# Patient Record
Sex: Male | Born: 1935 | Race: White | Hispanic: No | Marital: Married | Smoking: Never smoker
Health system: Southern US, Community
[De-identification: ages and names within clinical notes are randomized; demographics above are authoritative.]

## PROBLEM LIST (undated history)

## (undated) DIAGNOSIS — R0689 Other abnormalities of breathing: Secondary | ICD-10-CM

## (undated) DIAGNOSIS — I10 Essential (primary) hypertension: Secondary | ICD-10-CM

## (undated) DIAGNOSIS — G629 Polyneuropathy, unspecified: Secondary | ICD-10-CM

## (undated) DIAGNOSIS — D649 Anemia, unspecified: Secondary | ICD-10-CM

## (undated) DIAGNOSIS — Z0000 Encounter for general adult medical examination without abnormal findings: Secondary | ICD-10-CM

## (undated) DIAGNOSIS — E039 Hypothyroidism, unspecified: Secondary | ICD-10-CM

## (undated) DIAGNOSIS — K7460 Unspecified cirrhosis of liver: Secondary | ICD-10-CM

## (undated) DIAGNOSIS — C159 Malignant neoplasm of esophagus, unspecified: Principal | ICD-10-CM

## (undated) DIAGNOSIS — M109 Gout, unspecified: Secondary | ICD-10-CM

## (undated) DIAGNOSIS — M810 Age-related osteoporosis without current pathological fracture: Secondary | ICD-10-CM

## (undated) DIAGNOSIS — J90 Pleural effusion, not elsewhere classified: Secondary | ICD-10-CM

## (undated) DIAGNOSIS — B019 Varicella without complication: Secondary | ICD-10-CM

## (undated) DIAGNOSIS — E7800 Pure hypercholesterolemia, unspecified: Secondary | ICD-10-CM

## (undated) DIAGNOSIS — I2510 Atherosclerotic heart disease of native coronary artery without angina pectoris: Secondary | ICD-10-CM

## (undated) DIAGNOSIS — B059 Measles without complication: Secondary | ICD-10-CM

## (undated) HISTORY — PX: TONSILLECTOMY: SUR1361

## (undated) HISTORY — DX: Pleural effusion, not elsewhere classified: J90

## (undated) HISTORY — DX: Anemia, unspecified: D64.9

## (undated) HISTORY — DX: Malignant neoplasm of esophagus, unspecified: C15.9

## (undated) HISTORY — PX: CATARACT EXTRACTION, BILATERAL: SHX1313

## (undated) HISTORY — DX: Unspecified cirrhosis of liver: K74.60

## (undated) HISTORY — DX: Measles without complication: B05.9

## (undated) HISTORY — DX: Age-related osteoporosis without current pathological fracture: M81.0

## (undated) HISTORY — PX: FOOT SURGERY: SHX648

## (undated) HISTORY — DX: Gout, unspecified: M10.9

## (undated) HISTORY — DX: Other abnormalities of breathing: R06.89

## (undated) HISTORY — DX: Encounter for general adult medical examination without abnormal findings: Z00.00

## (undated) HISTORY — PX: HERNIA REPAIR: SHX51

## (undated) HISTORY — PX: PELVIC ABCESS DRAINAGE: SHX2189

## (undated) HISTORY — DX: Varicella without complication: B01.9

---

## 2010-06-01 ENCOUNTER — Institutional Professional Consult (permissible substitution): Payer: Self-pay | Admitting: Cardiovascular Disease

## 2011-01-21 ENCOUNTER — Encounter: Payer: Self-pay | Admitting: *Deleted

## 2011-01-21 ENCOUNTER — Emergency Department (HOSPITAL_BASED_OUTPATIENT_CLINIC_OR_DEPARTMENT_OTHER)
Admission: EM | Admit: 2011-01-21 | Discharge: 2011-01-21 | Disposition: A | Discharge status: Home / Self Care | Payer: Medicare Other | Attending: Emergency Medicine | Admitting: Emergency Medicine

## 2011-01-21 ENCOUNTER — Emergency Department (INDEPENDENT_AMBULATORY_CARE_PROVIDER_SITE_OTHER): Payer: Medicare Other

## 2011-01-21 DIAGNOSIS — Z79899 Other long term (current) drug therapy: Secondary | ICD-10-CM | POA: Insufficient documentation

## 2011-01-21 DIAGNOSIS — S80211A Abrasion, right knee, initial encounter: Secondary | ICD-10-CM

## 2011-01-21 DIAGNOSIS — S0180XA Unspecified open wound of other part of head, initial encounter: Secondary | ICD-10-CM | POA: Insufficient documentation

## 2011-01-21 DIAGNOSIS — E7800 Pure hypercholesterolemia, unspecified: Secondary | ICD-10-CM | POA: Insufficient documentation

## 2011-01-21 DIAGNOSIS — G319 Degenerative disease of nervous system, unspecified: Secondary | ICD-10-CM | POA: Insufficient documentation

## 2011-01-21 DIAGNOSIS — W19XXXA Unspecified fall, initial encounter: Secondary | ICD-10-CM

## 2011-01-21 DIAGNOSIS — IMO0002 Reserved for concepts with insufficient information to code with codable children: Secondary | ICD-10-CM | POA: Insufficient documentation

## 2011-01-21 DIAGNOSIS — S0181XA Laceration without foreign body of other part of head, initial encounter: Secondary | ICD-10-CM

## 2011-01-21 DIAGNOSIS — I10 Essential (primary) hypertension: Secondary | ICD-10-CM | POA: Insufficient documentation

## 2011-01-21 DIAGNOSIS — Y9301 Activity, walking, marching and hiking: Secondary | ICD-10-CM | POA: Insufficient documentation

## 2011-01-21 DIAGNOSIS — S40019A Contusion of unspecified shoulder, initial encounter: Secondary | ICD-10-CM | POA: Insufficient documentation

## 2011-01-21 DIAGNOSIS — M25519 Pain in unspecified shoulder: Secondary | ICD-10-CM

## 2011-01-21 DIAGNOSIS — E079 Disorder of thyroid, unspecified: Secondary | ICD-10-CM | POA: Insufficient documentation

## 2011-01-21 HISTORY — DX: Essential (primary) hypertension: I10

## 2011-01-21 HISTORY — DX: Pure hypercholesterolemia, unspecified: E78.00

## 2011-01-21 MED ORDER — TETANUS-DIPHTHERIA TOXOIDS TD 5-2 LFU IM INJ
0.5000 mL | INJECTION | Freq: Once | INTRAMUSCULAR | Status: AC
  Administered 2011-01-21: 0.5 mL via INTRAMUSCULAR
  Filled 2011-01-21: qty 0.5

## 2011-01-21 MED ORDER — LIDOCAINE-EPINEPHRINE 2 %-1:100000 IJ SOLN
INTRAMUSCULAR | Status: AC
  Filled 2011-01-21: qty 1

## 2011-01-21 NOTE — ED Provider Notes (Signed)
History     CSN: [id number]  Arrival date & time 01/21/11  4098   First MD Initiated Contact with Patient 01/21/11 0930      Chief Complaint  Patient presents with  . Fall    (Consider location/radiation/quality/duration/timing/severity/associated sxs/prior treatment) Patient is a 77 y.o. male presenting with fall. The history is provided by the patient.  Fall The accident occurred less than 1 hour ago. The fall occurred while walking. He fell from a height of 1 to 2 ft. He landed on concrete. The volume of blood lost was moderate. The point of impact was the head. The pain is present in the head and right shoulder. The pain is moderate. He was ambulatory at the scene. Pertinent negatives include no visual change and no nausea. He has tried nothing for the symptoms.    Past Medical History  Diagnosis Date  . Hypertension   . High cholesterol   . Thyroid disease     Past Surgical History  Procedure Date  . Hernia repair   . Pelvic abcess drainage     states surgery affected control of BM  . Tonsillectomy     No family history on file.  History  Substance Use Topics  . Smoking status: Never Smoker   . Smokeless tobacco: Not on file  . Alcohol Use: Yes      Review of Systems  Gastrointestinal: Negative for nausea.  All other systems reviewed and are negative.    Allergies  Aspirin  Home Medications   Current Outpatient Rx  Name Route Sig Dispense Refill  . ATORVASTATIN CALCIUM 10 MG PO TABS Oral Take 10 mg by mouth at bedtime.     . CLOPIDOGREL BISULFATE 75 MG PO TABS Oral Take 75 mg by mouth daily.     Marland Kitchen LEVOTHYROXINE SODIUM 75 MCG PO TABS Oral Take 75 mcg by mouth daily.      Marland Kitchen LISINOPRIL 10 MG PO TABS Oral Take 10 mg by mouth daily.     Marland Kitchen METOPROLOL TARTRATE 25 MG PO TABS Oral Take 25 mg by mouth 2 (two) times daily.      Marland Kitchen NITROGLYCERIN 0.4 MG SL SUBL Sublingual Place 0.4 mg under the tongue every 5 (five) minutes as needed. For chest pain      BP  178/54  Pulse 61  Temp(Src) 97.9 F (36.6 C) (Oral)  Resp 20  SpO2 100%  Physical Exam  Nursing note and vitals reviewed. Constitutional: He is oriented to person, place, and time. He appears well-developed and well-nourished.  HENT:  Head: Normocephalic.       The right forehead has a laceration about 1.5 cm in length with a marked hematoma surrounding it.  A small pebble was removed.  Bleeding has been steady since injury.  Eyes: EOM are normal. Pupils are equal, round, and reactive to light.  Neck: Normal range of motion. Neck supple.  Cardiovascular: Normal rate, regular rhythm and normal heart sounds.   Pulmonary/Chest: Effort normal and breath sounds normal.  Abdominal: Soft. Bowel sounds are normal. He exhibits no distension. There is no tenderness.  Musculoskeletal: Normal range of motion.       There is an abrasion to the right knee, small laceration to the tip of the right thumb (no sutures indicated).    Neurological: He is alert and oriented to person, place, and time. No cranial nerve deficit.  Skin: Skin is warm and dry.    ED Course  Procedures (including critical care time)  Labs Reviewed - No data to display No results found.   No diagnosis found.  01/21/2011  LACERATION REPAIR Performed by: Geoffery Lyons Authorized by: Geoffery Lyons Consent: Verbal consent obtained. Risks and benefits: risks, benefits and alternatives were discussed Consent given by: patient Patient identity confirmed: provided demographic data Prepped and Draped in normal sterile fashion Wound explored  Laceration Location: forehead   Laceration Length: 1.5 cm  No Foreign Bodies seen or palpated  Anesthesia: local infiltration  Local anesthetic: lidocaine 2% with epinephrine  Anesthetic total: 2 ml  Irrigation method: syringe Amount of cleaning: standard  Skin closure: 6-0 nylon  Number of sutures: 3  Technique: simple interrupted  Patient tolerance: Patient  tolerated the procedure well with no immediate complications.  MDM  Patient had lac repaired then went to CT.  The scan showed a foreign body but no bleed or traumatic findings.  I removed the sutures, then was able to located the fb which was removed with forceps, the wound irrigated, then closed again with 3 6-0 ethilon sutures.  Will discharge to home with local wound care.          Geoffery Lyons, MD 01/21/11 [phone]

## 2011-01-21 NOTE — ED Notes (Signed)
Patient states he tripped and hit right forehead and fell on R knee, no loc, hit head head on pavement, states fell on R thumb, hit knee and and then hit head

## 2011-01-21 NOTE — ED Notes (Signed)
I completed all wound care including cleaning with "safety cleans" and then secured all with 2x2's, bacitracin, kerlix, and tape. Paper tape used on forehead injury. I gave patient's wife additional wound care supplies, and I advised direct pressure if bleeding resumes.

## 2011-01-28 ENCOUNTER — Encounter (HOSPITAL_BASED_OUTPATIENT_CLINIC_OR_DEPARTMENT_OTHER): Payer: Self-pay | Admitting: Emergency Medicine

## 2011-01-28 ENCOUNTER — Emergency Department (HOSPITAL_BASED_OUTPATIENT_CLINIC_OR_DEPARTMENT_OTHER)
Admission: EM | Admit: 2011-01-28 | Discharge: 2011-01-28 | Disposition: A | Discharge status: Home / Self Care | Payer: Medicare Other | Attending: Emergency Medicine | Admitting: Emergency Medicine

## 2011-01-28 DIAGNOSIS — E079 Disorder of thyroid, unspecified: Secondary | ICD-10-CM | POA: Diagnosis not present

## 2011-01-28 DIAGNOSIS — I10 Essential (primary) hypertension: Secondary | ICD-10-CM | POA: Diagnosis not present

## 2011-01-28 DIAGNOSIS — Z79899 Other long term (current) drug therapy: Secondary | ICD-10-CM | POA: Insufficient documentation

## 2011-01-28 DIAGNOSIS — I2510 Atherosclerotic heart disease of native coronary artery without angina pectoris: Secondary | ICD-10-CM | POA: Insufficient documentation

## 2011-01-28 DIAGNOSIS — E7800 Pure hypercholesterolemia, unspecified: Secondary | ICD-10-CM | POA: Insufficient documentation

## 2011-01-28 DIAGNOSIS — Z4802 Encounter for removal of sutures: Secondary | ICD-10-CM | POA: Insufficient documentation

## 2011-01-28 HISTORY — DX: Atherosclerotic heart disease of native coronary artery without angina pectoris: I25.10

## 2011-01-28 NOTE — ED Notes (Signed)
Suture removal left forehead, s/p fall 7 days ago.

## 2011-01-28 NOTE — ED Provider Notes (Signed)
History     CSN: [id number]  Arrival date & time 01/28/11  1019   First MD Initiated Contact with Patient 01/28/11 1120      Chief Complaint  Patient presents with  . Suture / Staple Removal    forehead    (Consider location/radiation/quality/duration/timing/severity/associated sxs/prior treatment) Patient is a 78 y.o. male presenting with suture removal. The history is provided by the patient. No language interpreter was used.  Suture / Staple Removal  The sutures were placed 3 to 6 days ago. There has been no treatment since the wound repair. His temperature was unmeasured prior to arrival. There has been no drainage from the wound. There is no redness present. There is no swelling present. The pain has no pain.  Pt here for suture removal.  No complaints  Past Medical History  Diagnosis Date  . Hypertension   . High cholesterol   . Thyroid disease   . CAD (coronary artery disease)     Past Surgical History  Procedure Date  . Hernia repair   . Pelvic abcess drainage     states surgery affected control of BM  . Tonsillectomy     History reviewed. No pertinent family history.  History  Substance Use Topics  . Smoking status: Never Smoker   . Smokeless tobacco: Not on file  . Alcohol Use: 1.2 oz/week    2 Shots of liquor per week     daily      Review of Systems  All other systems reviewed and are negative.    Allergies  Aspirin  Home Medications   Current Outpatient Rx  Name Route Sig Dispense Refill  . ATORVASTATIN CALCIUM 10 MG PO TABS Oral Take 10 mg by mouth at bedtime.     . CLOPIDOGREL BISULFATE 75 MG PO TABS Oral Take 75 mg by mouth daily.     Marland Kitchen LEVOTHYROXINE SODIUM 75 MCG PO TABS Oral Take 75 mcg by mouth daily.      Marland Kitchen LISINOPRIL 10 MG PO TABS Oral Take 10 mg by mouth daily.     Marland Kitchen METOPROLOL TARTRATE 25 MG PO TABS Oral Take 25 mg by mouth 2 (two) times daily.      Marland Kitchen NITROGLYCERIN 0.4 MG SL SUBL Sublingual Place 0.4 mg under the tongue every 5  (five) minutes as needed. For chest pain      BP 124/51  Pulse 60  Temp(Src) 97.6 F (36.4 C) (Oral)  Resp 20  Ht 5\' 10"  (1.778 m)  Wt 180 lb (81.647 kg)  BMI 25.83 kg/m2  SpO2 100%  Physical Exam  Nursing note and vitals reviewed. Constitutional: He is oriented to person, place, and time. He appears well-developed and well-nourished.  HENT:  Head: Normocephalic.  Eyes: Pupils are equal, round, and reactive to light.  Neck: Normal range of motion.  Cardiovascular: Normal rate.   Pulmonary/Chest: Effort normal.  Musculoskeletal:       Laceration right forehead well healed,  No sign of infection  Neurological: He is alert and oriented to person, place, and time. He has normal reflexes.  Skin: Skin is warm.  Psychiatric: He has a normal mood and affect.    ED Course  Procedures (including critical care time)  Labs Reviewed - No data to display No results found.   No diagnosis found.    MDM   Sutures removed        Langston Masker, [place] 01/28/11 1220

## 2011-01-29 NOTE — ED Provider Notes (Signed)
Medical screening examination/treatment/procedure(s) were performed by non-physician practitioner and as supervising physician I was immediately available for consultation/collaboration.  Cyndra Numbers, MD 01/29/11 [phone]

## 2011-02-10 DIAGNOSIS — H3550 Unspecified hereditary retinal dystrophy: Secondary | ICD-10-CM | POA: Diagnosis not present

## 2011-03-23 DIAGNOSIS — I10 Essential (primary) hypertension: Secondary | ICD-10-CM | POA: Diagnosis not present

## 2011-03-23 DIAGNOSIS — Z79899 Other long term (current) drug therapy: Secondary | ICD-10-CM | POA: Diagnosis not present

## 2011-03-23 DIAGNOSIS — Z125 Encounter for screening for malignant neoplasm of prostate: Secondary | ICD-10-CM | POA: Diagnosis not present

## 2011-03-23 DIAGNOSIS — I209 Angina pectoris, unspecified: Secondary | ICD-10-CM | POA: Diagnosis not present

## 2011-03-23 DIAGNOSIS — E789 Disorder of lipoprotein metabolism, unspecified: Secondary | ICD-10-CM | POA: Diagnosis not present

## 2011-03-23 DIAGNOSIS — I2510 Atherosclerotic heart disease of native coronary artery without angina pectoris: Secondary | ICD-10-CM | POA: Diagnosis not present

## 2011-03-30 ENCOUNTER — Encounter: Payer: Self-pay | Admitting: Internal Medicine

## 2011-03-30 ENCOUNTER — Ambulatory Visit (INDEPENDENT_AMBULATORY_CARE_PROVIDER_SITE_OTHER): Payer: Medicare Other | Admitting: Internal Medicine

## 2011-03-30 DIAGNOSIS — R739 Hyperglycemia, unspecified: Secondary | ICD-10-CM

## 2011-03-30 DIAGNOSIS — K922 Gastrointestinal hemorrhage, unspecified: Secondary | ICD-10-CM

## 2011-03-30 DIAGNOSIS — I10 Essential (primary) hypertension: Secondary | ICD-10-CM

## 2011-03-30 DIAGNOSIS — I6529 Occlusion and stenosis of unspecified carotid artery: Secondary | ICD-10-CM

## 2011-03-30 DIAGNOSIS — K611 Rectal abscess: Secondary | ICD-10-CM

## 2011-03-30 DIAGNOSIS — R7989 Other specified abnormal findings of blood chemistry: Secondary | ICD-10-CM

## 2011-03-30 DIAGNOSIS — I2510 Atherosclerotic heart disease of native coronary artery without angina pectoris: Secondary | ICD-10-CM | POA: Diagnosis not present

## 2011-03-30 DIAGNOSIS — E039 Hypothyroidism, unspecified: Secondary | ICD-10-CM

## 2011-03-30 DIAGNOSIS — R945 Abnormal results of liver function studies: Secondary | ICD-10-CM

## 2011-03-30 DIAGNOSIS — K612 Anorectal abscess: Secondary | ICD-10-CM

## 2011-03-30 DIAGNOSIS — R7309 Other abnormal glucose: Secondary | ICD-10-CM

## 2011-03-30 MED ORDER — TRIAMCINOLONE ACETONIDE 0.1 % EX CREA: TOPICAL_CREAM | Freq: Two times a day (BID) | CUTANEOUS | Status: DC | PRN

## 2011-03-30 NOTE — Progress Notes (Signed)
  Subjective:    Patient ID: Fernando Liu, male    DOB: June 26, 1926, 78 y.o.   MRN: [id number]  HPI Pt presents to clinic for follow up of multiple medical problems. H/o HTN and bp reviewed normotensive. Tolerates statin therapy without myalgias. Has never undergone colonoscopy and not currently interested in pursuing. H/o cad followed by cardiology and states had labwork last week. Past lft's elevated thought to be secondary to etoh use. States has decreased overall use.   Past Medical History  Diagnosis Date  . Hypertension   . High cholesterol   . Thyroid disease   . CAD (coronary artery disease)    Past Surgical History  Procedure Date  . Hernia repair   . Pelvic abcess drainage     states surgery affected control of BM  . Tonsillectomy   . Cataract extraction, bilateral     August & November 2012    reports that he has never smoked. He has never used smokeless tobacco. He reports that he drinks about 1.2 ounces of alcohol per week. He reports that he does not use illicit drugs. family history includes Hypertension in his mother and Stroke in his mother.  There is no history of Breast cancer, and Heart disease, and Diabetes, and Colon cancer, and Prostate cancer, . Allergies  Allergen Reactions  . Aspirin Swelling     Review of Systems  Respiratory: Negative for cough and shortness of breath.   Cardiovascular: Negative for chest pain and palpitations.  Gastrointestinal: Negative for abdominal pain and blood in stool.  All other systems reviewed and are negative.       Objective:   Physical Exam  Physical Exam  Nursing note and vitals reviewed. Constitutional: Appears well-developed and well-nourished. No distress.  HENT:  Head: Normocephalic and atraumatic.  Right Ear: External ear normal.  Left Ear: External ear normal.  Eyes: Conjunctivae are normal. No scleral icterus.  Neck: Neck supple. Carotid bruit is not present.  Cardiovascular: Normal rate, regular  rhythm and normal heart sounds.  Exam reveals no gallop and no friction rub.   No murmur heard. Pulmonary/Chest: Effort normal and breath sounds normal. No respiratory distress. He has no wheezes. no rales.  Lymphadenopathy:    He has no cervical adenopathy.  Neurological:Alert.  Skin: Skin is warm and dry. Not diaphoretic.  Psychiatric: Has a normal mood and affect.         Assessment & Plan:

## 2011-03-31 ENCOUNTER — Telehealth: Payer: Self-pay | Admitting: Internal Medicine

## 2011-03-31 NOTE — Telephone Encounter (Signed)
Received medical records from Dr. Leonette Most Crowell's office.

## 2011-04-03 DIAGNOSIS — I2510 Atherosclerotic heart disease of native coronary artery without angina pectoris: Secondary | ICD-10-CM | POA: Insufficient documentation

## 2011-04-03 DIAGNOSIS — I10 Essential (primary) hypertension: Secondary | ICD-10-CM | POA: Insufficient documentation

## 2011-04-03 DIAGNOSIS — K922 Gastrointestinal hemorrhage, unspecified: Secondary | ICD-10-CM | POA: Insufficient documentation

## 2011-04-03 DIAGNOSIS — I6529 Occlusion and stenosis of unspecified carotid artery: Secondary | ICD-10-CM | POA: Insufficient documentation

## 2011-04-03 DIAGNOSIS — R739 Hyperglycemia, unspecified: Secondary | ICD-10-CM | POA: Insufficient documentation

## 2011-04-03 DIAGNOSIS — E039 Hypothyroidism, unspecified: Secondary | ICD-10-CM | POA: Insufficient documentation

## 2011-04-03 DIAGNOSIS — R945 Abnormal results of liver function studies: Secondary | ICD-10-CM | POA: Insufficient documentation

## 2011-04-03 DIAGNOSIS — K611 Rectal abscess: Secondary | ICD-10-CM | POA: Insufficient documentation

## 2011-04-03 NOTE — Assessment & Plan Note (Signed)
Obtain and review outside labwork. If not performed then tsh and free t4 with next visit

## 2011-04-03 NOTE — Assessment & Plan Note (Signed)
Attempt to limit etoh use

## 2011-04-03 NOTE — Assessment & Plan Note (Signed)
Normotensive and stable. Continue current regimen. Monitor bp as outpt and followup in clinic as scheduled.  

## 2011-07-15 ENCOUNTER — Other Ambulatory Visit: Payer: Self-pay | Admitting: Internal Medicine

## 2011-07-15 NOTE — Telephone Encounter (Signed)
Rx refill sent to pharmacy. 

## 2011-07-21 DIAGNOSIS — I2510 Atherosclerotic heart disease of native coronary artery without angina pectoris: Secondary | ICD-10-CM | POA: Diagnosis not present

## 2011-07-21 DIAGNOSIS — I209 Angina pectoris, unspecified: Secondary | ICD-10-CM | POA: Diagnosis not present

## 2011-07-21 DIAGNOSIS — I10 Essential (primary) hypertension: Secondary | ICD-10-CM | POA: Diagnosis not present

## 2011-08-04 ENCOUNTER — Ambulatory Visit (INDEPENDENT_AMBULATORY_CARE_PROVIDER_SITE_OTHER): Payer: Medicare Other | Admitting: Internal Medicine

## 2011-08-04 ENCOUNTER — Encounter: Payer: Self-pay | Admitting: Internal Medicine

## 2011-08-04 VITALS — BP 160/68 | HR 71 | Wt 185.0 lb

## 2011-08-04 DIAGNOSIS — R7309 Other abnormal glucose: Secondary | ICD-10-CM

## 2011-08-04 DIAGNOSIS — I10 Essential (primary) hypertension: Secondary | ICD-10-CM | POA: Diagnosis not present

## 2011-08-04 DIAGNOSIS — Z79899 Other long term (current) drug therapy: Secondary | ICD-10-CM

## 2011-08-04 DIAGNOSIS — R739 Hyperglycemia, unspecified: Secondary | ICD-10-CM

## 2011-08-04 LAB — CBC WITH DIFFERENTIAL/PLATELET
Eosinophils Absolute: 0.5 10*3/uL (ref 0.0–0.7)
Eosinophils Relative: 7 % — ABNORMAL HIGH (ref 0–5)
HCT: 44.3 % (ref 39.0–52.0)
Hemoglobin: 15 g/dL (ref 13.0–17.0)
Lymphocytes Relative: 17 % (ref 12–46)
Lymphs Abs: 1.2 10*3/uL (ref 0.7–4.0)
MCH: 32.3 pg (ref 26.0–34.0)
MCV: 95.5 fL (ref 78.0–100.0)
Monocytes Absolute: 0.5 10*3/uL (ref 0.1–1.0)
Monocytes Relative: 8 % (ref 3–12)
RBC: 4.64 MIL/uL (ref 4.22–5.81)
WBC: 6.9 10*3/uL (ref 4.0–10.5)

## 2011-08-04 LAB — BASIC METABOLIC PANEL
CO2: 24 mEq/L (ref 19–32)
Calcium: 8.8 mg/dL (ref 8.4–10.5)
Chloride: 107 mEq/L (ref 96–112)
Creat: 1.19 mg/dL (ref 0.50–1.35)
Glucose, Bld: 106 mg/dL — ABNORMAL HIGH (ref 70–99)

## 2011-08-04 NOTE — Assessment & Plan Note (Signed)
Obtain chem7 and a1c 

## 2011-08-04 NOTE — Progress Notes (Signed)
  Subjective:    Patient ID: Fernando Liu, male    DOB: 1926/10/25, 78 y.o.   MRN: [id number]  HPI Pt presents to clinic for followup of multiple medical problems. BP elevated and rechecked to be 134/70. States recent monitoring has been normotensive. Reviewed outside labs 2/13 including glucose 110 without h/o DM and mildly elevated hgb 16.5. Tsh, ldl 66 and psa nl.   Past Medical History  Diagnosis Date  . Hypertension   . High cholesterol   . Thyroid disease   . CAD (coronary artery disease)    Past Surgical History  Procedure Date  . Hernia repair   . Pelvic abcess drainage     states surgery affected control of BM  . Tonsillectomy   . Cataract extraction, bilateral     August & November 2012    reports that he has never smoked. He has never used smokeless tobacco. He reports that he drinks about 1.2 ounces of alcohol per week. He reports that he does not use illicit drugs. family history includes Hypertension in his mother and Stroke in his mother.  There is no history of Breast cancer, and Heart disease, and Diabetes, and Colon cancer, and Prostate cancer, . Allergies  Allergen Reactions  . Aspirin Swelling      Review of Systems see hpi     Objective:   Physical Exam  Physical Exam  Nursing note and vitals reviewed. Constitutional: Appears well-developed and well-nourished. No distress.  HENT:  Head: Normocephalic and atraumatic.  Right Ear: External ear normal.  Left Ear: External ear normal.  Eyes: Conjunctivae are normal. No scleral icterus.  Neck: Neck supple. Carotid bruit is not present.  Cardiovascular: Normal rate, regular rhythm and normal heart sounds.  Exam reveals no gallop and no friction rub.   No murmur heard. Pulmonary/Chest: Effort normal and breath sounds normal. No respiratory distress. He has no wheezes. no rales.  Lymphadenopathy:    He has no cervical adenopathy.  Neurological:Alert.  Skin: Skin is warm and dry. Not diaphoretic.    Psychiatric: Has a normal mood and affect.        Assessment & Plan:

## 2011-08-04 NOTE — Patient Instructions (Signed)
Please schedule fasting labs prior to next visit chem7-v58.69, lipid/lft-272.4 and tsh/free t4-hypothyroidism 

## 2011-08-04 NOTE — Assessment & Plan Note (Signed)
Isolated elevated. Continue current regimen and advised home monitoring.

## 2011-11-09 ENCOUNTER — Other Ambulatory Visit: Payer: Self-pay | Admitting: Internal Medicine

## 2011-11-10 NOTE — Telephone Encounter (Signed)
Done/SLS 

## 2011-11-29 ENCOUNTER — Telehealth: Payer: Self-pay | Admitting: *Deleted

## 2011-11-29 ENCOUNTER — Telehealth: Payer: Self-pay | Admitting: Internal Medicine

## 2011-11-29 DIAGNOSIS — E039 Hypothyroidism, unspecified: Secondary | ICD-10-CM

## 2011-11-29 DIAGNOSIS — E785 Hyperlipidemia, unspecified: Secondary | ICD-10-CM

## 2011-11-29 DIAGNOSIS — Z79899 Other long term (current) drug therapy: Secondary | ICD-10-CM

## 2011-11-29 LAB — HEPATIC FUNCTION PANEL
AST: 33 U/L (ref 0–37)
Albumin: 3.9 g/dL (ref 3.5–5.2)
Alkaline Phosphatase: 109 U/L (ref 39–117)
Total Bilirubin: 0.6 mg/dL (ref 0.3–1.2)
Total Protein: 6.6 g/dL (ref 6.0–8.3)

## 2011-11-29 LAB — LIPID PANEL
Cholesterol: 110 mg/dL (ref 0–200)
HDL: 43 mg/dL (ref >39)
Total CHOL/HDL Ratio: 2.6 Ratio

## 2011-11-29 LAB — BASIC METABOLIC PANEL
BUN: 18 mg/dL (ref 6–23)
CO2: 25 mEq/L (ref 19–32)
Calcium: 9.2 mg/dL (ref 8.4–10.5)
Glucose, Bld: 108 mg/dL — ABNORMAL HIGH (ref 70–99)
Sodium: 140 mEq/L (ref 135–145)

## 2011-11-29 LAB — T4, FREE: Free T4: 1.05 ng/dL (ref 0.80–1.80)

## 2011-11-29 LAB — TSH: TSH: 1.937 u[IU]/mL (ref 0.350–4.500)

## 2011-11-29 NOTE — Telephone Encounter (Signed)
Pt presented to the lab. Future orders entered per last office note, see below:  Please schedule fasting labs prior to next visit  chem7-v58.69, lipid/lft-272.4 and tsh/free t4-hypothyroidism

## 2011-11-29 NOTE — Telephone Encounter (Signed)
HE HAD LABS TODAY  PLEASE SEND A COPY OF THESE LABS TO DR CROWELL PHONE [phone]

## 2011-12-06 ENCOUNTER — Ambulatory Visit (INDEPENDENT_AMBULATORY_CARE_PROVIDER_SITE_OTHER): Payer: Medicare Other | Admitting: Internal Medicine

## 2011-12-06 ENCOUNTER — Encounter: Payer: Self-pay | Admitting: Internal Medicine

## 2011-12-06 VITALS — BP 128/70 | HR 70 | Temp 97.7°F | Resp 18 | Wt 188.2 lb

## 2011-12-06 DIAGNOSIS — R7309 Other abnormal glucose: Secondary | ICD-10-CM | POA: Diagnosis not present

## 2011-12-06 DIAGNOSIS — I10 Essential (primary) hypertension: Secondary | ICD-10-CM

## 2011-12-06 DIAGNOSIS — K6289 Other specified diseases of anus and rectum: Secondary | ICD-10-CM | POA: Insufficient documentation

## 2011-12-06 DIAGNOSIS — E785 Hyperlipidemia, unspecified: Secondary | ICD-10-CM

## 2011-12-06 DIAGNOSIS — R739 Hyperglycemia, unspecified: Secondary | ICD-10-CM

## 2011-12-06 NOTE — Assessment & Plan Note (Signed)
Mild. Low sugar carbohydrate diet

## 2011-12-06 NOTE — Assessment & Plan Note (Signed)
No obvious abscess or other abnormality on exam. Followup if no improvement or worsening.

## 2011-12-06 NOTE — Assessment & Plan Note (Signed)
Current good control. Continue current dosing of statin.

## 2011-12-06 NOTE — Patient Instructions (Signed)
Please schedule fasting labs prior to next visit Cbc-v58.69, chem7, a1c-hyperglycemia and lipid/lft-272.4

## 2011-12-06 NOTE — Assessment & Plan Note (Signed)
Normotensive and stable. Continue current regimen. Monitor bp as outpt and followup in clinic as scheduled.  

## 2011-12-06 NOTE — Progress Notes (Signed)
  Subjective:    Patient ID: Fernando Liu, male    DOB: May 11, 1926, 78 y.o.   MRN: [id number]  HPI Pt presents to clinic for followup of multiple medical problems. Notes several day history of loose stools without diarrhea. This is irritated perirectal area. Denies known abscess, purulent drainage. Blood pressure reviewed as normotensive. Reviewed mildly elevated glucose. Declines influenza vaccine.  Past Medical History  Diagnosis Date  . Hypertension   . High cholesterol   . Thyroid disease   . CAD (coronary artery disease)    Past Surgical History  Procedure Date  . Hernia repair   . Pelvic abcess drainage     states surgery affected control of BM  . Tonsillectomy   . Cataract extraction, bilateral     August & November 2012    reports that he has never smoked. He has never used smokeless tobacco. He reports that he drinks about 1.2 ounces of alcohol per week. He reports that he does not use illicit drugs. family history includes Hypertension in his mother and Stroke in his mother.  There is no history of Breast cancer, and Heart disease, and Diabetes, and Colon cancer, and Prostate cancer, . Allergies  Allergen Reactions  . Aspirin Swelling      Review of Systems see hpi    Objective:   Physical Exam  Physical Exam  Nursing note and vitals reviewed. Constitutional: Appears well-developed and well-nourished. No distress.  HENT:  Head: Normocephalic and atraumatic.  Right Ear: External ear normal.  Left Ear: External ear normal.  Eyes: Conjunctivae are normal. No scleral icterus.  Neck: Neck supple. Carotid bruit is not present.  Cardiovascular: Normal rate, regular rhythm and normal heart sounds.  Exam reveals no gallop and no friction rub.   No murmur heard. Pulmonary/Chest: Effort normal and breath sounds normal. No respiratory distress. He has no wheezes. no rales.  Lymphadenopathy:    He has no cervical adenopathy.  Neurological:Alert.  Skin: Skin is warm and  dry. Not diaphoretic.  Psychiatric: Has a normal mood and affect.  Rectal: Perirectal irritation without bleeding, abscess, mass or other abnormality. Nontender.     Assessment & Plan:

## 2011-12-07 ENCOUNTER — Ambulatory Visit: Payer: Medicare Other | Admitting: Internal Medicine

## 2011-12-22 DIAGNOSIS — I10 Essential (primary) hypertension: Secondary | ICD-10-CM | POA: Diagnosis not present

## 2011-12-22 DIAGNOSIS — E789 Disorder of lipoprotein metabolism, unspecified: Secondary | ICD-10-CM | POA: Diagnosis not present

## 2011-12-22 DIAGNOSIS — I209 Angina pectoris, unspecified: Secondary | ICD-10-CM | POA: Diagnosis not present

## 2012-02-21 DIAGNOSIS — H02839 Dermatochalasis of unspecified eye, unspecified eyelid: Secondary | ICD-10-CM | POA: Diagnosis not present

## 2012-02-21 DIAGNOSIS — H3550 Unspecified hereditary retinal dystrophy: Secondary | ICD-10-CM | POA: Diagnosis not present

## 2012-03-01 ENCOUNTER — Other Ambulatory Visit: Payer: Self-pay | Admitting: Internal Medicine

## 2012-03-02 ENCOUNTER — Telehealth: Payer: Self-pay | Admitting: Internal Medicine

## 2012-03-02 DIAGNOSIS — R739 Hyperglycemia, unspecified: Secondary | ICD-10-CM

## 2012-03-02 DIAGNOSIS — K922 Gastrointestinal hemorrhage, unspecified: Secondary | ICD-10-CM

## 2012-03-02 DIAGNOSIS — E785 Hyperlipidemia, unspecified: Secondary | ICD-10-CM

## 2012-03-02 DIAGNOSIS — I2510 Atherosclerotic heart disease of native coronary artery without angina pectoris: Secondary | ICD-10-CM

## 2012-03-02 DIAGNOSIS — Z79899 Other long term (current) drug therapy: Secondary | ICD-10-CM

## 2012-03-02 DIAGNOSIS — Z0000 Encounter for general adult medical examination without abnormal findings: Secondary | ICD-10-CM

## 2012-03-02 DIAGNOSIS — R7309 Other abnormal glucose: Secondary | ICD-10-CM | POA: Diagnosis not present

## 2012-03-02 DIAGNOSIS — Z125 Encounter for screening for malignant neoplasm of prostate: Secondary | ICD-10-CM

## 2012-03-02 NOTE — Telephone Encounter (Signed)
Dr Abner Greenspan and I tried to do PSA and it won't let us order this. Patient may have to pay for this test.

## 2012-03-02 NOTE — Telephone Encounter (Signed)
Patient has upcoming appointment on 05/04/12 with Dr. Abner Greenspan. He says that he is supposed to go do labs one week prior to this appointment and he would like Dr. Abner Greenspan to order prostate test to this order.

## 2012-04-03 ENCOUNTER — Telehealth: Payer: Self-pay | Admitting: Internal Medicine

## 2012-04-03 MED ORDER — LEVOTHYROXINE SODIUM 75 MCG PO TABS: 75.0000 ug | ORAL_TABLET | Freq: Every day | ORAL | Status: DC

## 2012-04-03 NOTE — Telephone Encounter (Signed)
levothyroxin tablet  Take 1 daily qty 30 Dr Rodena Medin did not fill this for him as he did not need refills at the last visit.  This was given by his previous doctor Dr Luz Brazen.  She has retired.  [facility] would not send [place] the refill request

## 2012-04-26 ENCOUNTER — Telehealth: Payer: Self-pay | Admitting: Family

## 2012-04-26 DIAGNOSIS — Z125 Encounter for screening for malignant neoplasm of prostate: Secondary | ICD-10-CM | POA: Diagnosis not present

## 2012-04-26 DIAGNOSIS — E785 Hyperlipidemia, unspecified: Secondary | ICD-10-CM | POA: Diagnosis not present

## 2012-04-26 DIAGNOSIS — Z79899 Other long term (current) drug therapy: Secondary | ICD-10-CM | POA: Diagnosis not present

## 2012-04-26 DIAGNOSIS — R7309 Other abnormal glucose: Secondary | ICD-10-CM | POA: Diagnosis not present

## 2012-04-26 LAB — BASIC METABOLIC PANEL
BUN: 23 mg/dL (ref 6–23)
CO2: 26 mEq/L (ref 19–32)
Glucose, Bld: 114 mg/dL — ABNORMAL HIGH (ref 70–99)
Potassium: 4.3 mEq/L (ref 3.5–5.3)
Sodium: 141 mEq/L (ref 135–145)

## 2012-04-26 LAB — LIPID PANEL
HDL: 44 mg/dL (ref >39)
LDL Cholesterol: 53 mg/dL (ref 0–99)
Total CHOL/HDL Ratio: 2.7 Ratio
Triglycerides: 112 mg/dL (ref <150)
VLDL: 22 mg/dL (ref 0–40)

## 2012-04-26 LAB — HEPATIC FUNCTION PANEL
Albumin: 3.8 g/dL (ref 3.5–5.2)
Alkaline Phosphatase: 116 U/L (ref 39–117)
Total Bilirubin: 1.1 mg/dL (ref 0.3–1.2)
Total Protein: 6.5 g/dL (ref 6.0–8.3)

## 2012-04-26 LAB — CBC
HCT: 45.5 % (ref 39.0–52.0)
Hemoglobin: 15.6 g/dL (ref 13.0–17.0)
MCHC: 34.3 g/dL (ref 30.0–36.0)
RBC: 4.94 MIL/uL (ref 4.22–5.81)

## 2012-04-26 LAB — PSA, MEDICARE: PSA: 0.64 ng/mL (ref <=4.00)

## 2012-04-26 NOTE — Telephone Encounter (Signed)
Pt presented to lab, requests PSA be drawn in addition to ordered labs.

## 2012-05-04 ENCOUNTER — Ambulatory Visit (INDEPENDENT_AMBULATORY_CARE_PROVIDER_SITE_OTHER): Payer: Medicare Other | Admitting: Family Medicine

## 2012-05-04 ENCOUNTER — Telehealth: Payer: Self-pay

## 2012-05-04 ENCOUNTER — Encounter: Payer: Self-pay | Admitting: Family Medicine

## 2012-05-04 ENCOUNTER — Other Ambulatory Visit: Payer: Self-pay | Admitting: Family Medicine

## 2012-05-04 VITALS — BP 141/70 | HR 64 | Temp 97.6°F | Ht 70.0 in | Wt 188.0 lb

## 2012-05-04 DIAGNOSIS — R739 Hyperglycemia, unspecified: Secondary | ICD-10-CM

## 2012-05-04 DIAGNOSIS — E785 Hyperlipidemia, unspecified: Secondary | ICD-10-CM

## 2012-05-04 DIAGNOSIS — I2510 Atherosclerotic heart disease of native coronary artery without angina pectoris: Secondary | ICD-10-CM | POA: Diagnosis not present

## 2012-05-04 DIAGNOSIS — I10 Essential (primary) hypertension: Secondary | ICD-10-CM

## 2012-05-04 DIAGNOSIS — K6289 Other specified diseases of anus and rectum: Secondary | ICD-10-CM

## 2012-05-04 DIAGNOSIS — R7309 Other abnormal glucose: Secondary | ICD-10-CM

## 2012-05-04 DIAGNOSIS — E039 Hypothyroidism, unspecified: Secondary | ICD-10-CM

## 2012-05-04 NOTE — Patient Instructions (Addendum)
Next appt 6 months, annual, labs prior lipid, renal, cbc, tsh, hepatic, hgba1c, free t4  Hypertension As your heart beats, it forces blood through your arteries. This force is your blood pressure. If the pressure is too high, it is called hypertension (HTN) or high blood pressure. HTN is dangerous because you may have it and not know it. High blood pressure may mean that your heart has to work harder to pump blood. Your arteries may be narrow or stiff. The extra work puts you at risk for heart disease, stroke, and other problems.  Blood pressure consists of two numbers, a higher number over a lower, 110/72, for example. It is stated as "110 over 72." The ideal is below 120 for the top number (systolic) and under 80 for the bottom (diastolic). Write down your blood pressure today. You should pay close attention to your blood pressure if you have certain conditions such as:  Heart failure.  Prior heart attack.  Diabetes  Chronic kidney disease.  Prior stroke.  Multiple risk factors for heart disease. To see if you have HTN, your blood pressure should be measured while you are seated with your arm held at the level of the heart. It should be measured at least twice. A one-time elevated blood pressure reading (especially in the Emergency Department) does not mean that you need treatment. There may be conditions in which the blood pressure is different between your right and left arms. It is important to see your caregiver soon for a recheck. Most people have essential hypertension which means that there is not a specific cause. This type of high blood pressure may be lowered by changing lifestyle factors such as:  Stress.  Smoking.  Lack of exercise.  Excessive weight.  Drug/tobacco/alcohol use.  Eating less salt. Most people do not have symptoms from high blood pressure until it has caused damage to the body. Effective treatment can often prevent, delay or reduce that  damage. TREATMENT  When a cause has been identified, treatment for high blood pressure is directed at the cause. There are a large number of medications to treat HTN. These fall into several categories, and your caregiver will help you select the medicines that are best for you. Medications may have side effects. You should review side effects with your caregiver. If your blood pressure stays high after you have made lifestyle changes or started on medicines,   Your medication(s) may need to be changed.  Other problems may need to be addressed.  Be certain you understand your prescriptions, and know how and when to take your medicine.  Be sure to follow up with your caregiver within the time frame advised (usually within two weeks) to have your blood pressure rechecked and to review your medications.  If you are taking more than one medicine to lower your blood pressure, make sure you know how and at what times they should be taken. Taking two medicines at the same time can result in blood pressure that is too low. SEEK IMMEDIATE MEDICAL CARE IF:  You develop a severe headache, blurred or changing vision, or confusion.  You have unusual weakness or numbness, or a faint feeling.  You have severe chest or abdominal pain, vomiting, or breathing problems. MAKE SURE YOU:   Understand these instructions.  Will watch your condition.  Will get help right away if you are not doing well or get worse. Document Released: 01/11/2005 Document Revised: 04/05/2011 Document Reviewed: 09/01/2007 [facility] Patient Information 2013 [place].  

## 2012-05-04 NOTE — Assessment & Plan Note (Signed)
Sees cardiology every 6 months, disease stable, no new symptoms

## 2012-05-04 NOTE — Assessment & Plan Note (Signed)
Thyroid studies stable in November, no concerning symptms repeat studies with next visit

## 2012-05-04 NOTE — Telephone Encounter (Signed)
Labs ordered.

## 2012-05-04 NOTE — Assessment & Plan Note (Signed)
hgba1c 5.6, no new concerns

## 2012-05-04 NOTE — Assessment & Plan Note (Signed)
Occasional scant rectal bleeding. Stable. Given and I thought just to complete. Has not had colonoscopy. Bleeding generally occurs with occasional episodes of straining or diarrhea likely internal hemorrhoids mild

## 2012-05-04 NOTE — Assessment & Plan Note (Signed)
Well controlled, no changes to meds at today's visit

## 2012-05-04 NOTE — Assessment & Plan Note (Signed)
Tolerating Lipitor 

## 2012-05-04 NOTE — Progress Notes (Signed)
Patient ID: Fernando Liu, male   DOB: 1926-02-01, 79 y.o.   MRN: [id number] Braylin Xu [id number] [birth year] 05/04/2012      Progress Note-Follow Up  Subjective  Chief Complaint  Chief Complaint  Patient presents with  . Follow-up    5 month    HPI  Patient is an 79 year old Caucasian male who is in today for routine followup. He feels well. He follows closely every 6 months with his cardiologist as well. He has some seasonal symptoms of mild chest pressure upon ambulation that resolves with rest but is unchanged. He also notes a stable history some intermittent rectal irritation or bleeding but never any associated pain. No complaints of recent illness, palpitations, shortness of breath, other GI or GU complaints at this time  Past Medical History  Diagnosis Date  . Hypertension   . High cholesterol   . Thyroid disease   . CAD (coronary artery disease)     Past Surgical History  Procedure Laterality Date  . Hernia repair    . Pelvic abcess drainage      states surgery affected control of BM  . Tonsillectomy    . Cataract extraction, bilateral      August & November 2012    Family History  Problem Relation Age of Onset  . Stroke Mother   . Breast cancer Neg Hx   . Heart disease Neg Hx   . Diabetes Neg Hx   . Hypertension Mother   . Colon cancer Neg Hx   . Prostate cancer Neg Hx     History   Social History  . Marital Status: Married    Spouse Name: N/A    Number of Children: N/A  . Years of Education: N/A   Occupational History  . Not on file.   Social History Main Topics  . Smoking status: Never Smoker   . Smokeless tobacco: Never Used  . Alcohol Use: 1.2 oz/week    2 Shots of liquor per week     Comment: daily  . Drug Use: No  . Sexually Active: Not on file   Other Topics Concern  . Not on file   Social History Narrative  . No narrative on file    Current Outpatient Prescriptions on File Prior to Visit  Medication Sig Dispense Refill  .  atorvastatin (LIPITOR) 10 MG tablet Take 10 mg by mouth at bedtime.       . clopidogrel (PLAVIX) 75 MG tablet Take 75 mg by mouth daily.       Marland Kitchen levothyroxine (SYNTHROID, LEVOTHROID) 75 MCG tablet Take 1 tablet (75 mcg total) by mouth daily.  30 tablet  3  . lisinopril (PRINIVIL,ZESTRIL) 10 MG tablet TAKE 1 TABLET DAILY FOR BLOOD PRESSURE.  30 tablet  3  . metoprolol tartrate (LOPRESSOR) 25 MG tablet Take 25 mg by mouth 2 (two) times daily.        . nitroGLYCERIN (NITROSTAT) 0.4 MG SL tablet Place 0.4 mg under the tongue every 5 (five) minutes as needed. For chest pain       No current facility-administered medications on file prior to visit.    Allergies  Allergen Reactions  . Aspirin Swelling    Review of Systems  Review of Systems  Constitutional: Negative for malaise/fatigue.  Respiratory: Negative for shortness of breath.   Cardiovascular: Positive for chest pain. Negative for palpitations and leg swelling.  Gastrointestinal: Positive for diarrhea, constipation and blood in stool. Negative for nausea, abdominal pain and melena.  Genitourinary: Negative for dysuria.  Musculoskeletal: Negative for falls.  Neurological: Negative for loss of consciousness.  Psychiatric/Behavioral: Negative for depression.    Objective  BP 141/70  Pulse 64  Temp(Src) 97.6 F (36.4 C) (Oral)  Ht 5\' 10"  (1.778 m)  Wt 188 lb 0.6 oz (85.294 kg)  BMI 26.98 kg/m2  SpO2 97%  Physical Exam  Physical Exam  Constitutional: He is oriented to person, place, and time and well-developed, well-nourished, and in no distress. No distress.  HENT:  Head: Normocephalic and atraumatic.  Eyes: Conjunctivae are normal.  Neck: Neck supple. No thyromegaly present.  Cardiovascular: Normal rate, regular rhythm and normal heart sounds.   No murmur heard. Pulmonary/Chest: Effort normal and breath sounds normal. No respiratory distress.  Abdominal: Bowel sounds are normal. He exhibits no distension and no mass.  There is no tenderness.  Musculoskeletal: He exhibits no edema.  Neurological: He is alert and oriented to person, place, and time.  Skin: Skin is warm.  Psychiatric: Memory, affect and judgment normal.    Lab Results  Component Value Date   TSH 1.937 11/29/2011   Lab Results  Component Value Date   WBC 6.5 04/26/2012   HGB 15.6 04/26/2012   HCT 45.5 04/26/2012   MCV 92.1 04/26/2012   PLT 155 04/26/2012   Lab Results  Component Value Date   CREATININE 1.31 04/26/2012   BUN 23 04/26/2012   NA 141 04/26/2012   K 4.3 04/26/2012   CL 106 04/26/2012   CO2 26 04/26/2012   Lab Results  Component Value Date   ALT 38 04/26/2012   AST 36 04/26/2012   ALKPHOS 116 04/26/2012   BILITOT 1.1 04/26/2012   Lab Results  Component Value Date   CHOL 119 04/26/2012   Lab Results  Component Value Date   HDL 44 04/26/2012   Lab Results  Component Value Date   LDLCALC 53 04/26/2012   Lab Results  Component Value Date   TRIG 112 04/26/2012   Lab Results  Component Value Date   CHOLHDL 2.7 04/26/2012     Assessment & Plan  HTN (hypertension) Well controlled, no changes to meds at today's visit  CAD (coronary artery disease) Sees cardiology every 6 months, disease stable, no new symptoms  Hypothyroidism Thyroid studies stable in November, no concerning symptms repeat studies with next visit  Hyperlipidemia Tolerating Lipitor  Hyperglycemia hgba1c 5.6, no new concerns  Perirectal skin irritation Occasional scant rectal bleeding. Stable. Given and I thought just to complete. Has not had colonoscopy. Bleeding generally occurs with occasional episodes of straining or diarrhea likely internal hemorrhoids mild

## 2012-06-14 DIAGNOSIS — I10 Essential (primary) hypertension: Secondary | ICD-10-CM | POA: Diagnosis not present

## 2012-06-14 DIAGNOSIS — I739 Peripheral vascular disease, unspecified: Secondary | ICD-10-CM | POA: Diagnosis not present

## 2012-06-14 DIAGNOSIS — I2510 Atherosclerotic heart disease of native coronary artery without angina pectoris: Secondary | ICD-10-CM | POA: Diagnosis not present

## 2012-06-25 ENCOUNTER — Other Ambulatory Visit: Payer: Self-pay | Admitting: Family Medicine

## 2012-07-04 ENCOUNTER — Encounter: Payer: Self-pay | Admitting: Family

## 2012-07-04 ENCOUNTER — Encounter: Payer: Self-pay | Admitting: Family Medicine

## 2012-07-26 ENCOUNTER — Other Ambulatory Visit: Payer: Self-pay

## 2012-07-26 MED ORDER — LEVOTHYROXINE SODIUM 75 MCG PO TABS: 75.0000 ug | ORAL_TABLET | Freq: Every day | ORAL | Status: DC

## 2012-08-31 ENCOUNTER — Ambulatory Visit: Payer: Medicare Other | Admitting: Family Medicine

## 2012-10-09 ENCOUNTER — Ambulatory Visit: Payer: Medicare Other | Admitting: Family Medicine

## 2012-10-11 ENCOUNTER — Telehealth: Payer: Self-pay

## 2012-10-11 DIAGNOSIS — E039 Hypothyroidism, unspecified: Secondary | ICD-10-CM

## 2012-10-11 DIAGNOSIS — R739 Hyperglycemia, unspecified: Secondary | ICD-10-CM

## 2012-10-11 DIAGNOSIS — I10 Essential (primary) hypertension: Secondary | ICD-10-CM

## 2012-10-11 DIAGNOSIS — R7309 Other abnormal glucose: Secondary | ICD-10-CM | POA: Diagnosis not present

## 2012-10-11 DIAGNOSIS — E785 Hyperlipidemia, unspecified: Secondary | ICD-10-CM

## 2012-10-11 LAB — CBC
HCT: 44.4 % (ref 39.0–52.0)
MCH: 33.3 pg (ref 26.0–34.0)
MCV: 96.7 fL (ref 78.0–100.0)
RDW: 13.2 % (ref 11.5–15.5)
WBC: 6.8 10*3/uL (ref 4.0–10.5)

## 2012-10-11 LAB — HEMOGLOBIN A1C
Hgb A1c MFr Bld: 5.3 % (ref <5.7)
Mean Plasma Glucose: 105 mg/dL (ref <117)

## 2012-10-11 NOTE — Telephone Encounter (Signed)
Lab order placed.

## 2012-10-12 LAB — HEPATIC FUNCTION PANEL
Albumin: 4 g/dL (ref 3.5–5.2)
Total Bilirubin: 0.9 mg/dL (ref 0.3–1.2)
Total Protein: 6.7 g/dL (ref 6.0–8.3)

## 2012-10-12 LAB — RENAL FUNCTION PANEL
CO2: 27 mEq/L (ref 19–32)
Chloride: 105 mEq/L (ref 96–112)
Creat: 1.36 mg/dL — ABNORMAL HIGH (ref 0.50–1.35)
Glucose, Bld: 104 mg/dL — ABNORMAL HIGH (ref 70–99)

## 2012-10-12 LAB — LIPID PANEL
Cholesterol: 118 mg/dL (ref 0–200)
HDL: 45 mg/dL (ref >39)
LDL Cholesterol: 58 mg/dL (ref 0–99)
Triglycerides: 77 mg/dL (ref <150)

## 2012-10-12 LAB — T4, FREE: Free T4: 1.36 ng/dL (ref 0.80–1.80)

## 2012-10-12 LAB — TSH: TSH: 2.399 u[IU]/mL (ref 0.350–4.500)

## 2012-10-19 ENCOUNTER — Ambulatory Visit: Payer: Medicare Other | Admitting: Family Medicine

## 2012-10-30 ENCOUNTER — Other Ambulatory Visit: Payer: Self-pay | Admitting: Family Medicine

## 2012-10-30 ENCOUNTER — Other Ambulatory Visit: Payer: Self-pay | Admitting: Internal Medicine

## 2012-10-30 NOTE — Telephone Encounter (Signed)
Rx request to pharmacy/[facility]  

## 2012-10-31 ENCOUNTER — Ambulatory Visit (INDEPENDENT_AMBULATORY_CARE_PROVIDER_SITE_OTHER): Payer: Medicare Other | Admitting: Family Medicine

## 2012-10-31 ENCOUNTER — Encounter: Payer: Self-pay | Admitting: Family Medicine

## 2012-10-31 VITALS — BP 112/64 | HR 63 | Temp 97.9°F | Ht 70.0 in | Wt 190.0 lb

## 2012-10-31 DIAGNOSIS — I10 Essential (primary) hypertension: Secondary | ICD-10-CM

## 2012-10-31 DIAGNOSIS — Z23 Encounter for immunization: Secondary | ICD-10-CM | POA: Diagnosis not present

## 2012-10-31 DIAGNOSIS — E039 Hypothyroidism, unspecified: Secondary | ICD-10-CM | POA: Diagnosis not present

## 2012-10-31 DIAGNOSIS — E785 Hyperlipidemia, unspecified: Secondary | ICD-10-CM

## 2012-10-31 DIAGNOSIS — Z0000 Encounter for general adult medical examination without abnormal findings: Secondary | ICD-10-CM

## 2012-10-31 DIAGNOSIS — K6289 Other specified diseases of anus and rectum: Secondary | ICD-10-CM

## 2012-10-31 DIAGNOSIS — R7309 Other abnormal glucose: Secondary | ICD-10-CM

## 2012-10-31 DIAGNOSIS — R739 Hyperglycemia, unspecified: Secondary | ICD-10-CM

## 2012-10-31 HISTORY — DX: Encounter for general adult medical examination without abnormal findings: Z00.00

## 2012-10-31 MED ORDER — ATORVASTATIN CALCIUM 10 MG PO TABS: 10.0000 mg | ORAL_TABLET | Freq: Every day | ORAL | Status: DC

## 2012-10-31 NOTE — Assessment & Plan Note (Signed)
Well controlled on current meds. No changes

## 2012-10-31 NOTE — Progress Notes (Signed)
Patient ID: Fernando Liu, male   DOB: 08/20/1926, 79 y.o.   MRN: [id number] Keiandre Cygan [id number] 1926-03-20 10/31/2012      Progress Note-Follow Up  Subjective  Chief Complaint  Chief Complaint  Patient presents with  . Annual Exam    physical    HPI  Patient is an 79 year old male who is in today for annual Medicare exam and followup on his medical conditions. He feels well. He reports no recent illness. He's not had any vision or hearing changes. He's not had any falls or depression in the last year. Is functioning well in his home in managing his HDL is. Paying his bills on time. No complaints of fevers, chills, headache, chest pain, palpitations or shortness of breath, GI or GU concerns. Is taking medications as prescribed. No in the emergency room severe  Past Medical History  Diagnosis Date  . Hypertension   . High cholesterol   . Thyroid disease   . CAD (coronary artery disease)   . Chicken pox 79 yrs old  . Measles as a child    Past Surgical History  Procedure Laterality Date  . Hernia repair    . Pelvic abcess drainage      states surgery affected control of BM  . Tonsillectomy    . Cataract extraction, bilateral      August & November 2012    Family History  Problem Relation Age of Onset  . Stroke Mother   . Hypertension Mother   . Breast cancer Neg Hx   . Heart disease Neg Hx   . Diabetes Neg Hx   . Colon cancer Neg Hx   . Prostate cancer Neg Hx     History   Social History  . Marital Status: Married    Spouse Name: N/A    Number of Children: N/A  . Years of Education: N/A   Occupational History  . Not on file.   Social History Main Topics  . Smoking status: Never Smoker   . Smokeless tobacco: Never Used  . Alcohol Use: 1.2 oz/week    2 Shots of liquor per week     Comment: daily  . Drug Use: No  . Sexual Activity: Not on file   Other Topics Concern  . Not on file   Social History Narrative  . No narrative on file    Current  Outpatient Prescriptions on File Prior to Visit  Medication Sig Dispense Refill  . atorvastatin (LIPITOR) 10 MG tablet Take 10 mg by mouth at bedtime.       . clopidogrel (PLAVIX) 75 MG tablet Take 75 mg by mouth daily.       Marland Kitchen levothyroxine (SYNTHROID, LEVOTHROID) 75 MCG tablet TAKE 1 TABLET (75 MCG TOTAL) BY MOUTH DAILY.  30 tablet  3  . lisinopril (PRINIVIL,ZESTRIL) 10 MG tablet TAKE 1 TABLET DAILY FOR BLOOD PRESSURE.  30 tablet  3  . metoprolol tartrate (LOPRESSOR) 25 MG tablet Take 25 mg by mouth 2 (two) times daily.        . nitroGLYCERIN (NITROSTAT) 0.4 MG SL tablet Place 0.4 mg under the tongue every 5 (five) minutes as needed. For chest pain       No current facility-administered medications on file prior to visit.    Allergies  Allergen Reactions  . Aspirin Swelling    Review of Systems  Review of Systems  Constitutional: Negative for fever and malaise/fatigue.  HENT: Negative for congestion.   Eyes: Negative for discharge.  Respiratory: Negative for shortness of breath.   Cardiovascular: Negative for chest pain, palpitations and leg swelling.  Gastrointestinal: Negative for nausea, abdominal pain and diarrhea.  Genitourinary: Negative for dysuria.  Musculoskeletal: Negative for falls.  Skin: Negative for rash.  Neurological: Negative for loss of consciousness and headaches.  Endo/Heme/Allergies: Negative for polydipsia.  Psychiatric/Behavioral: Negative for depression and suicidal ideas. The patient is not nervous/anxious and does not have insomnia.     Objective  BP 112/64  Pulse 63  Temp(Src) 97.9 F (36.6 C) (Oral)  Ht 5\' 10"  (1.778 m)  Wt 190 lb 0.6 oz (86.202 kg)  BMI 27.27 kg/m2  SpO2 97%  Physical Exam  Physical Exam  Constitutional: He is oriented to person, place, and time and well-developed, well-nourished, and in no distress. No distress.  HENT:  Head: Normocephalic and atraumatic.  Eyes: Conjunctivae are normal.  Neck: Neck supple. No  thyromegaly present.  Cardiovascular: Normal rate, regular rhythm and normal heart sounds.   No murmur heard. Pulmonary/Chest: Effort normal and breath sounds normal. No respiratory distress.  Abdominal: He exhibits no distension and no mass. There is no tenderness.  Musculoskeletal: He exhibits no edema.  Neurological: He is alert and oriented to person, place, and time.  Skin: Skin is warm.  Psychiatric: Memory, affect and judgment normal.    Lab Results  Component Value Date   TSH 2.399 10/11/2012   Lab Results  Component Value Date   WBC 6.8 10/11/2012   HGB 15.3 10/11/2012   HCT 44.4 10/11/2012   MCV 96.7 10/11/2012   PLT 159 10/11/2012   Lab Results  Component Value Date   CREATININE 1.36* 10/11/2012   BUN 23 10/11/2012   NA 142 10/11/2012   K 4.7 10/11/2012   CL 105 10/11/2012   CO2 27 10/11/2012   Lab Results  Component Value Date   ALT 31 10/11/2012   AST 33 10/11/2012   ALKPHOS 113 10/11/2012   BILITOT 0.9 10/11/2012   Lab Results  Component Value Date   CHOL 118 10/11/2012   Lab Results  Component Value Date   HDL 45 10/11/2012   Lab Results  Component Value Date   LDLCALC 58 10/11/2012   Lab Results  Component Value Date   TRIG 77 10/11/2012   Lab Results  Component Value Date   CHOLHDL 2.6 10/11/2012     Assessment & Plan  HTN (hypertension) Well controlled on current meds. No changes  Hyperlipidemia Tolerating statin, avoid trans fats, well controlled  Hyperglycemia Good hgba1c 5.3 avoid simple carbs. No changes  Hypothyroidism Well controlled on current meds. No changes  Preventative health care Declines flu shots every years, given pneumonia shot today, encouraged heart healthy. Move as tolerated.   Perirectal skin irritation Encouraged Probiotics, Fiber and Witch Hazel Astringent

## 2012-10-31 NOTE — Patient Instructions (Addendum)
Consider a probiotic daily, Digestive Advantage daily Consider a fiber supplement twice daily in food or beverage Cleanse with [facility] Astringent as needed  Try the Sarna anti itch lotion Call insurance to see if Zostavax, shingles shot is covered   Preventive Care for Adults, Male A healthy lifestyle and preventive care can promote health and wellness. Preventive health guidelines for men include the following key practices:  A routine yearly physical is a good way to check with your caregiver about your health and preventative screening. It is a chance to share any concerns and updates on your health, and to receive a thorough exam.  Visit your dentist for a routine exam and preventative care every 6 months. Brush your teeth twice a day and floss once a day. Good oral hygiene prevents tooth decay and gum disease.  The frequency of eye exams is based on your age, health, family medical history, use of contact lenses, and other factors. Follow your caregiver's recommendations for frequency of eye exams.  Eat a healthy diet. Foods like vegetables, fruits, whole grains, low-fat dairy products, and lean protein foods contain the nutrients you need without too many calories. Decrease your intake of foods high in solid fats, added sugars, and salt. Eat the right amount of calories for you.Get information about a proper diet from your caregiver, if necessary.  Regular physical exercise is one of the most important things you can do for your health. Most adults should get at least 150 minutes of moderate-intensity exercise (any activity that increases your heart rate and causes you to sweat) each week. In addition, most adults need muscle-strengthening exercises on 2 or more days a week.  Maintain a healthy weight. The body mass index (BMI) is a screening tool to identify possible weight problems. It provides an estimate of body fat based on height and weight. Your caregiver can help determine  your BMI, and can help you achieve or maintain a healthy weight.For adults 20 years and older:  A BMI below 18.5 is considered underweight.  A BMI of 18.5 to 24.9 is normal.  A BMI of 25 to 29.9 is considered overweight.  A BMI of 30 and above is considered obese.  Maintain normal blood lipids and cholesterol levels by exercising and minimizing your intake of saturated fat. Eat a balanced diet with plenty of fruit and vegetables. Blood tests for lipids and cholesterol should begin at age 75 and be repeated every 5 years. If your lipid or cholesterol levels are high, you are over 50, or you are a high risk for heart disease, you may need your cholesterol levels checked more frequently.Ongoing high lipid and cholesterol levels should be treated with medicines if diet and exercise are not effective.  If you smoke, find out from your caregiver how to quit. If you do not use tobacco, do not start.  If you choose to drink alcohol, do not exceed 2 drinks per day. One drink is considered to be 12 ounces (355 mL) of beer, 5 ounces (148 mL) of wine, or 1.5 ounces (44 mL) of liquor.  Avoid use of street drugs. Do not share needles with anyone. Ask for help if you need support or instructions about stopping the use of drugs.  High blood pressure causes heart disease and increases the risk of stroke. Your blood pressure should be checked at least every 1 to 2 years. Ongoing high blood pressure should be treated with medicines, if weight loss and exercise are not effective.  If you are 6 to 77 years old, ask your caregiver if you should take aspirin to prevent heart disease.  Diabetes screening involves taking a blood sample to check your fasting blood sugar level. This should be done once every 3 years, after age 54, if you are within normal weight and without risk factors for diabetes. Testing should be considered at a younger age or be carried out more frequently if you are overweight and have at least  1 risk factor for diabetes.  Colorectal cancer can be detected and often prevented. Most routine colorectal cancer screening begins at the age of 59 and continues through age 63. However, your caregiver may recommend screening at an earlier age if you have risk factors for colon cancer. On a yearly basis, your caregiver may provide home test kits to check for hidden blood in the stool. Use of a small camera at the end of a tube, to directly examine the colon (sigmoidoscopy or colonoscopy), can detect the earliest forms of colorectal cancer. Talk to your caregiver about this at age 7, when routine screening begins. Direct examination of the colon should be repeated every 5 to 10 years through age 33, unless early forms of pre-cancerous polyps or small growths are found.  Hepatitis C blood testing is recommended for all people born from [birth year] through 1965 and any individual with known risks for hepatitis C.  Practice safe sex. Use condoms and avoid high-risk sexual practices to reduce the spread of sexually transmitted infections (STIs). STIs include gonorrhea, chlamydia, syphilis, trichomonas, herpes, HPV, and human immunodeficiency virus (HIV). Herpes, HIV, and HPV are viral illnesses that have no cure. They can result in disability, cancer, and death.  A one-time screening for abdominal aortic aneurysm (AAA) and surgical repair of large AAAs by sound wave imaging (ultrasonography) is recommended for ages 61 to 71 years who are current or former smokers.  Healthy men should no longer receive prostate-specific antigen (PSA) blood tests as part of routine cancer screening. Consult with your caregiver about prostate cancer screening.  Testicular cancer screening is not recommended for adult males who have no symptoms. Screening includes self-exam, caregiver exam, and other screening tests. Consult with your caregiver about any symptoms you have or any concerns you have about testicular cancer.  Use  sunscreen with skin protection factor (SPF) of 30 or more. Apply sunscreen liberally and repeatedly throughout the day. You should seek shade when your shadow is shorter than you. Protect yourself by wearing long sleeves, pants, a wide-brimmed hat, and sunglasses year round, whenever you are outdoors.  Once a month, do a whole body skin exam, using a mirror to look at the skin on your back. Notify your caregiver of new moles, moles that have irregular borders, moles that are larger than a pencil eraser, or moles that have changed in shape or color.  Stay current with required immunizations.  Influenza. You need a dose every fall (or winter). The composition of the flu vaccine changes each year, so being vaccinated once is not enough.  Pneumococcal polysaccharide. You need 1 to 2 doses if you smoke cigarettes or if you have certain chronic medical conditions. You need 1 dose at age 88 (or older) if you have never been vaccinated.  Tetanus, diphtheria, pertussis (Tdap, Td). Get 1 dose of Tdap vaccine if you are younger than age 58 years, are over 108 and have contact with an infant, are a Research scientist (physical sciences), or simply want to be protected from whooping cough.  After that, you need a Td booster dose every 10 years. Consult your caregiver if you have not had at least 3 tetanus and diphtheria-containing shots sometime in your life or have a deep or dirty wound.  HPV. This vaccine is recommended for males 13 through 77 years of age. This vaccine may be given to men 22 through 77 years of age who have not completed the 3 dose series. It is recommended for men through age 40 who have sex with men or whose immune system is weakened because of HIV infection, other illness, or medications. The vaccine is given in 3 doses over 6 months.  Measles, mumps, rubella (MMR). You need at least 1 dose of MMR if you were born in 1957 or later. You may also need a 2nd dose.  Meningococcal. If you are age 17 to 57 years and a  Orthoptist living in a residence hall, or have one of several medical conditions, you need to get vaccinated against meningococcal disease. You may also need additional booster doses.  Zoster (shingles). If you are age 23 years or older, you should get this vaccine.  Varicella (chickenpox). If you have never had chickenpox or you were vaccinated but received only 1 dose, talk to your caregiver to find out if you need this vaccine.  Hepatitis A. You need this vaccine if you have a specific risk factor for hepatitis A virus infection, or you simply wish to be protected from this disease. The vaccine is usually given as 2 doses, 6 to 18 months apart.  Hepatitis B. You need this vaccine if you have a specific risk factor for hepatitis B virus infection or you simply wish to be protected from this disease. The vaccine is given in 3 doses, usually over 6 months. Preventative Service / Frequency Ages 12 to 43  Blood pressure check.** / Every 1 to 2 years.  Lipid and cholesterol check.** / Every 5 years beginning at age 69.  Hepatitis C blood test.** / For any individual with known risks for hepatitis C.  Skin self-exam. / Monthly.  Influenza immunization.** / Every year.  Pneumococcal polysaccharide immunization.** / 1 to 2 doses if you smoke cigarettes or if you have certain chronic medical conditions.  Tetanus, diphtheria, pertussis (Tdap,Td) immunization. / A one-time dose of Tdap vaccine. After that, you need a Td booster dose every 10 years.  HPV immunization. / 3 doses over 6 months, if 26 and younger.  Measles, mumps, rubella (MMR) immunization. / You need at least 1 dose of MMR if you were born in 1957 or later. You may also need a 2nd dose.  Meningococcal immunization. / 1 dose if you are age 22 to 30 years and a Orthoptist living in a residence hall, or have one of several medical conditions, you need to get vaccinated against meningococcal disease.  You may also need additional booster doses.  Varicella immunization.** / Consult your caregiver.  Hepatitis A immunization.** / Consult your caregiver. 2 doses, 6 to 18 months apart.  Hepatitis B immunization.** / Consult your caregiver. 3 doses usually over 6 months. Ages 78 to 60  Blood pressure check.** / Every 1 to 2 years.  Lipid and cholesterol check.** / Every 5 years beginning at age 36.  Fecal occult blood test (FOBT) of stool. / Every year beginning at age 53 and continuing until age 108. You may not have to do this test if you get colonoscopy every 10 years.  Flexible  sigmoidoscopy** or colonoscopy.** / Every 5 years for a flexible sigmoidoscopy or every 10 years for a colonoscopy beginning at age 88 and continuing until age 82.  Hepatitis C blood test.** / For all people born from [birth year] through 1965 and any individual with known risks for hepatitis C.  Skin self-exam. / Monthly.  Influenza immunization.** / Every year.  Pneumococcal polysaccharide immunization.** / 1 to 2 doses if you smoke cigarettes or if you have certain chronic medical conditions.  Tetanus, diphtheria, pertussis (Tdap/Td) immunization.** / A one-time dose of Tdap vaccine. After that, you need a Td booster dose every 10 years.  Measles, mumps, rubella (MMR) immunization. / You need at least 1 dose of MMR if you were born in 1957 or later. You may also need a 2nd dose.  Varicella immunization.**/ Consult your caregiver.  Meningococcal immunization.** / Consult your caregiver.  Hepatitis A immunization.** / Consult your caregiver. 2 doses, 6 to 18 months apart.  Hepatitis B immunization.** / Consult your caregiver. 3 doses, usually over 6 months. Ages 9 and over  Blood pressure check.** / Every 1 to 2 years.  Lipid and cholesterol check.**/ Every 5 years beginning at age 64.  Fecal occult blood test (FOBT) of stool. / Every year beginning at age 27 and continuing until age 18. You may not have  to do this test if you get colonoscopy every 10 years.  Flexible sigmoidoscopy** or colonoscopy.** / Every 5 years for a flexible sigmoidoscopy or every 10 years for a colonoscopy beginning at age 44 and continuing until age 62.  Hepatitis C blood test.** / For all people born from [birth year] through 1965 and any individual with known risks for hepatitis C.  Abdominal aortic aneurysm (AAA) screening.** / A one-time screening for ages 47 to 42 years who are current or former smokers.  Skin self-exam. / Monthly.  Influenza immunization.** / Every year.  Pneumococcal polysaccharide immunization.** / 1 dose at age 10 (or older) if you have never been vaccinated.  Tetanus, diphtheria, pertussis (Tdap, Td) immunization. / A one-time dose of Tdap vaccine if you are over 65 and have contact with an infant, are a Research scientist (physical sciences), or simply want to be protected from whooping cough. After that, you need a Td booster dose every 10 years.  Varicella immunization. ** / Consult your caregiver.  Meningococcal immunization.** / Consult your caregiver.  Hepatitis A immunization. ** / Consult your caregiver. 2 doses, 6 to 18 months apart.  Hepatitis B immunization.** / Check with your caregiver. 3 doses, usually over 6 months. **Family history and personal history of risk and conditions may change your caregiver's recommendations. Document Released: 03/09/2001 Document Revised: 04/05/2011 Document Reviewed: 06/08/2010 [facility] Patient Information 2014 [place], [place].

## 2012-10-31 NOTE — Assessment & Plan Note (Addendum)
Declines flu shots every years, given pneumonia shot today, encouraged heart healthy. Move as tolerated.

## 2012-10-31 NOTE — Assessment & Plan Note (Signed)
Good hgba1c 5.3 avoid simple carbs. No changes

## 2012-10-31 NOTE — Assessment & Plan Note (Signed)
Encouraged Probiotics, Fiber and Musician

## 2012-10-31 NOTE — Assessment & Plan Note (Addendum)
Tolerating statin, avoid trans fats, well controlled

## 2012-11-15 ENCOUNTER — Other Ambulatory Visit: Payer: Self-pay | Admitting: Family Medicine

## 2012-11-15 NOTE — Telephone Encounter (Signed)
Request for Metoprolol refill, not on current medication list in EMR; shows as Hx Entry Error [12.27.2012]/SLS Please Advise.

## 2012-11-15 NOTE — Telephone Encounter (Signed)
Please confirm with patient he was taking the Metoprolol at his last visit and how and we can call him in a 3 month supply with 1 rf or a 30 day supply with 5 rf

## 2012-11-16 NOTE — Telephone Encounter (Signed)
Has this been done?

## 2012-11-17 MED ORDER — METOPROLOL TARTRATE 25 MG PO TABS: 25.0000 mg | ORAL_TABLET | Freq: Every day | ORAL | Status: DC

## 2012-11-17 NOTE — Telephone Encounter (Signed)
Patient states he is taking the Metoprolol but only takes it once a day. Pt would like a 30 day supply

## 2012-11-17 NOTE — Addendum Note (Signed)
Addended by: Court Joy on: 11/17/2012 08:04 AM   Modules accepted: Orders

## 2012-12-04 DIAGNOSIS — I209 Angina pectoris, unspecified: Secondary | ICD-10-CM | POA: Diagnosis not present

## 2012-12-04 DIAGNOSIS — E785 Hyperlipidemia, unspecified: Secondary | ICD-10-CM | POA: Diagnosis not present

## 2012-12-04 DIAGNOSIS — I10 Essential (primary) hypertension: Secondary | ICD-10-CM | POA: Diagnosis not present

## 2012-12-23 ENCOUNTER — Other Ambulatory Visit: Payer: Self-pay | Admitting: Family Medicine

## 2012-12-25 NOTE — Telephone Encounter (Signed)
Rx request to pharmacy/[facility]  

## 2013-01-12 DIAGNOSIS — H02839 Dermatochalasis of unspecified eye, unspecified eyelid: Secondary | ICD-10-CM | POA: Diagnosis not present

## 2013-01-12 DIAGNOSIS — H3550 Unspecified hereditary retinal dystrophy: Secondary | ICD-10-CM | POA: Diagnosis not present

## 2013-01-12 DIAGNOSIS — H43399 Other vitreous opacities, unspecified eye: Secondary | ICD-10-CM | POA: Diagnosis not present

## 2013-03-18 ENCOUNTER — Other Ambulatory Visit: Payer: Self-pay | Admitting: Family Medicine

## 2013-04-13 ENCOUNTER — Other Ambulatory Visit: Payer: Self-pay | Admitting: Family Medicine

## 2013-04-13 NOTE — Telephone Encounter (Signed)
Rx request to pharmacy/[facility]  

## 2013-04-30 ENCOUNTER — Telehealth: Payer: Self-pay | Admitting: *Deleted

## 2013-04-30 DIAGNOSIS — Z125 Encounter for screening for malignant neoplasm of prostate: Secondary | ICD-10-CM | POA: Diagnosis not present

## 2013-04-30 DIAGNOSIS — E785 Hyperlipidemia, unspecified: Secondary | ICD-10-CM | POA: Diagnosis not present

## 2013-04-30 DIAGNOSIS — I10 Essential (primary) hypertension: Secondary | ICD-10-CM | POA: Diagnosis not present

## 2013-04-30 LAB — CBC
HCT: 46.2 % (ref 39.0–52.0)
Hemoglobin: 15.6 g/dL (ref 13.0–17.0)
MCH: 32.4 pg (ref 26.0–34.0)
MCHC: 33.8 g/dL (ref 30.0–36.0)
MCV: 96 fL (ref 78.0–100.0)
PLATELETS: 146 10*3/uL — AB (ref 150–400)
RBC: 4.81 MIL/uL (ref 4.22–5.81)
RDW: 12.9 % (ref 11.5–15.5)
WBC: 6.8 10*3/uL (ref 4.0–10.5)

## 2013-04-30 LAB — HEPATIC FUNCTION PANEL
ALBUMIN: 3.8 g/dL (ref 3.5–5.2)
ALT: 30 U/L (ref 0–53)
AST: 34 U/L (ref 0–37)
Alkaline Phosphatase: 108 U/L (ref 39–117)
BILIRUBIN INDIRECT: 0.5 mg/dL (ref 0.2–1.2)
Bilirubin, Direct: 0.2 mg/dL (ref 0.0–0.3)
Total Bilirubin: 0.7 mg/dL (ref 0.2–1.2)
Total Protein: 6.4 g/dL (ref 6.0–8.3)

## 2013-04-30 LAB — TSH: TSH: 2.343 u[IU]/mL (ref 0.350–4.500)

## 2013-04-30 LAB — LIPID PANEL
Cholesterol: 116 mg/dL (ref 0–200)
HDL: 47 mg/dL (ref >39)
LDL CALC: 50 mg/dL (ref 0–99)
TRIGLYCERIDES: 93 mg/dL (ref <150)
Total CHOL/HDL Ratio: 2.5 Ratio
VLDL: 19 mg/dL (ref 0–40)

## 2013-04-30 LAB — RENAL FUNCTION PANEL
Albumin: 3.8 g/dL (ref 3.5–5.2)
BUN: 19 mg/dL (ref 6–23)
CHLORIDE: 106 meq/L (ref 96–112)
CO2: 26 meq/L (ref 19–32)
Calcium: 9 mg/dL (ref 8.4–10.5)
Creat: 1.3 mg/dL (ref 0.50–1.35)
Glucose, Bld: 105 mg/dL — ABNORMAL HIGH (ref 70–99)
Phosphorus: 2.8 mg/dL (ref 2.3–4.6)
Potassium: 4.8 mEq/L (ref 3.5–5.3)
SODIUM: 142 meq/L (ref 135–145)

## 2013-04-30 NOTE — Telephone Encounter (Signed)
Pt presented to the lab. Orders entered per 10/31/13 phone note as below:  Labs prior to visit, lipid, renal, psa, hepatic, cbc, tsh

## 2013-05-01 ENCOUNTER — Ambulatory Visit: Payer: Medicare Other | Admitting: Family Medicine

## 2013-05-01 LAB — PSA, MEDICARE: PSA: 0.62 ng/mL (ref <=4.00)

## 2013-05-08 ENCOUNTER — Ambulatory Visit (INDEPENDENT_AMBULATORY_CARE_PROVIDER_SITE_OTHER): Payer: Medicare Other | Admitting: Family Medicine

## 2013-05-08 ENCOUNTER — Encounter: Payer: Self-pay | Admitting: Family Medicine

## 2013-05-08 VITALS — BP 110/70 | HR 63 | Temp 97.8°F | Ht 70.0 in | Wt 194.0 lb

## 2013-05-08 DIAGNOSIS — E039 Hypothyroidism, unspecified: Secondary | ICD-10-CM | POA: Diagnosis not present

## 2013-05-08 DIAGNOSIS — R7309 Other abnormal glucose: Secondary | ICD-10-CM | POA: Diagnosis not present

## 2013-05-08 DIAGNOSIS — E785 Hyperlipidemia, unspecified: Secondary | ICD-10-CM

## 2013-05-08 DIAGNOSIS — I10 Essential (primary) hypertension: Secondary | ICD-10-CM | POA: Diagnosis not present

## 2013-05-08 DIAGNOSIS — R739 Hyperglycemia, unspecified: Secondary | ICD-10-CM

## 2013-05-08 NOTE — Patient Instructions (Signed)

## 2013-05-08 NOTE — Progress Notes (Signed)
Pre visit review using our clinic review tool, if applicable. No additional management support is needed unless otherwise documented below in the visit note. 

## 2013-05-13 ENCOUNTER — Encounter: Payer: Self-pay | Admitting: Family Medicine

## 2013-05-13 NOTE — Assessment & Plan Note (Signed)
hgba1c acceptable, minimize simple carbs. Increase exercise as tolerated.  

## 2013-05-13 NOTE — Assessment & Plan Note (Signed)
Tolerating statin, encouraged heart healthy diet, avoid trans fats, minimize simple carbs and saturated fats. Increase exercise as tolerated 

## 2013-05-13 NOTE — Progress Notes (Signed)
Patient ID: Fernando Liu, male   DOB: 1926-08-03, 80 y.o.   MRN: [id number] Najeeb Uptain [id number] 1926-10-14 05/13/2013      Progress Note-Follow Up  Subjective  Chief Complaint  Chief Complaint  Patient presents with  . Follow-up    6 month    HPI  Patient is a 80 year old male in today for routine medical care. He has basically been feeling okay but he has had a very stressful period of time. His daughter died in 01-23-2023 after being severely injured in a car accident many years ago. He has another daughter who has been diagnosed with colon cancer and the son-in-law who nearly died from an infected appendix. He physically has not had any illness. Does note some recent fecal urgency but denies any bloody or tarry stool. Denies CP/palp/SOB/HA/congestion/fevers/GI or GU c/o. Taking meds as prescribed  Past Medical History  Diagnosis Date  . Hypertension   . High cholesterol   . Thyroid disease   . CAD (coronary artery disease)   . Chicken pox 80 yrs old  . Measles as a child  . Preventative health care 10/31/2012    Past Surgical History  Procedure Laterality Date  . Hernia repair    . Pelvic abcess drainage      states surgery affected control of BM  . Tonsillectomy    . Cataract extraction, bilateral      August & November 2012    Family History  Problem Relation Age of Onset  . Stroke Mother   . Hypertension Mother   . Breast cancer Neg Hx   . Heart disease Neg Hx   . Diabetes Neg Hx   . Colon cancer Neg Hx   . Prostate cancer Neg Hx     History   Social History  . Marital Status: Married    Spouse Name: N/A    Number of Children: N/A  . Years of Education: N/A   Occupational History  . Not on file.   Social History Main Topics  . Smoking status: Never Smoker   . Smokeless tobacco: Never Used  . Alcohol Use: 1.2 oz/week    2 Shots of liquor per week     Comment: daily  . Drug Use: No  . Sexual Activity: Not on file   Other Topics Concern  .  Not on file   Social History Narrative  . No narrative on file    Current Outpatient Prescriptions on File Prior to Visit  Medication Sig Dispense Refill  . atorvastatin (LIPITOR) 10 MG tablet TAKE 1 TABLET AT BEDTIME  30 tablet  2  . clopidogrel (PLAVIX) 75 MG tablet Take 75 mg by mouth daily.       Marland Kitchen levothyroxine (SYNTHROID, LEVOTHROID) 75 MCG tablet TAKE 1 TABLET EVERY DAY  30 tablet  3  . lisinopril (PRINIVIL,ZESTRIL) 10 MG tablet TAKE 1 TABLET DAILY FOR BLOOD PRESSURE.  30 tablet  3  . metoprolol tartrate (LOPRESSOR) 25 MG tablet Take 1 tablet (25 mg total) by mouth daily.  30 tablet  5  . nitroGLYCERIN (NITROSTAT) 0.4 MG SL tablet Place 0.4 mg under the tongue every 5 (five) minutes as needed. For chest pain       No current facility-administered medications on file prior to visit.    Allergies  Allergen Reactions  . Aspirin Swelling    Review of Systems  Review of Systems  Constitutional: Negative for fever and malaise/fatigue.  HENT: Negative for congestion.   Eyes:  Negative for discharge.  Respiratory: Negative for shortness of breath.   Cardiovascular: Negative for chest pain, palpitations and leg swelling.  Gastrointestinal: Negative for nausea, abdominal pain and diarrhea.  Genitourinary: Negative for dysuria.  Musculoskeletal: Negative for falls.  Skin: Negative for rash.  Neurological: Negative for loss of consciousness and headaches.  Endo/Heme/Allergies: Negative for polydipsia.  Psychiatric/Behavioral: Negative for depression and suicidal ideas. The patient is not nervous/anxious and does not have insomnia.     Objective  BP 110/70  Pulse 63  Temp(Src) 97.8 F (36.6 C) (Oral)  Ht 5\' 10"  (1.778 m)  Wt 194 lb 0.6 oz (88.016 kg)  BMI 27.84 kg/m2  SpO2 96%  Physical Exam  Physical Exam  Constitutional: He is oriented to person, place, and time and well-developed, well-nourished, and in no distress. No distress.  HENT:  Head: Normocephalic and  atraumatic.  Eyes: Conjunctivae are normal.  Neck: Neck supple. No thyromegaly present.  Cardiovascular: Normal rate, regular rhythm and normal heart sounds.   No murmur heard. Pulmonary/Chest: Effort normal and breath sounds normal. No respiratory distress.  Abdominal: He exhibits no distension and no mass. There is no tenderness.  Musculoskeletal: He exhibits no edema.  Neurological: He is alert and oriented to person, place, and time.  Skin: Skin is warm.  Psychiatric: Memory, affect and judgment normal.    Lab Results  Component Value Date   TSH 2.343 04/30/2013   Lab Results  Component Value Date   WBC 6.8 04/30/2013   HGB 15.6 04/30/2013   HCT 46.2 04/30/2013   MCV 96.0 04/30/2013   PLT 146* 04/30/2013   Lab Results  Component Value Date   CREATININE 1.30 04/30/2013   BUN 19 04/30/2013   NA 142 04/30/2013   K 4.8 04/30/2013   CL 106 04/30/2013   CO2 26 04/30/2013   Lab Results  Component Value Date   ALT 30 04/30/2013   AST 34 04/30/2013   ALKPHOS 108 04/30/2013   BILITOT 0.7 04/30/2013   Lab Results  Component Value Date   CHOL 116 04/30/2013   Lab Results  Component Value Date   HDL 47 04/30/2013   Lab Results  Component Value Date   LDLCALC 50 04/30/2013   Lab Results  Component Value Date   TRIG 93 04/30/2013   Lab Results  Component Value Date   CHOLHDL 2.5 04/30/2013     Assessment & Plan  HTN (hypertension) Well controlled, no changes to meds. Encouraged heart healthy diet such as the DASH diet and exercise as tolerated.   Hypothyroidism On Levothyroxine, continue to monitor  Hyperglycemia hgba1c acceptable, minimize simple carbs. Increase exercise as tolerated.  Hyperlipidemia Tolerating statin, encouraged heart healthy diet, avoid trans fats, minimize simple carbs and saturated fats. Increase exercise as tolerated

## 2013-05-13 NOTE — Assessment & Plan Note (Signed)
On Levothyroxine, continue to monitor 

## 2013-05-13 NOTE — Assessment & Plan Note (Signed)
Well controlled, no changes to meds. Encouraged heart healthy diet such as the DASH diet and exercise as tolerated.  °

## 2013-06-06 ENCOUNTER — Other Ambulatory Visit: Payer: Self-pay | Admitting: Family Medicine

## 2013-06-13 ENCOUNTER — Other Ambulatory Visit: Payer: Self-pay | Admitting: Family Medicine

## 2013-07-12 ENCOUNTER — Other Ambulatory Visit: Payer: Self-pay | Admitting: Family Medicine

## 2013-08-01 DIAGNOSIS — I209 Angina pectoris, unspecified: Secondary | ICD-10-CM | POA: Diagnosis not present

## 2013-08-01 DIAGNOSIS — E785 Hyperlipidemia, unspecified: Secondary | ICD-10-CM | POA: Diagnosis not present

## 2013-08-01 DIAGNOSIS — I10 Essential (primary) hypertension: Secondary | ICD-10-CM | POA: Diagnosis not present

## 2013-10-25 ENCOUNTER — Other Ambulatory Visit: Payer: Self-pay | Admitting: Family Medicine

## 2013-10-29 ENCOUNTER — Other Ambulatory Visit: Payer: Self-pay | Admitting: Family Medicine

## 2013-11-01 ENCOUNTER — Telehealth: Payer: Self-pay | Admitting: Lab

## 2013-11-01 ENCOUNTER — Other Ambulatory Visit (INDEPENDENT_AMBULATORY_CARE_PROVIDER_SITE_OTHER): Payer: Medicare Other

## 2013-11-01 ENCOUNTER — Other Ambulatory Visit: Payer: Self-pay | Admitting: Family Medicine

## 2013-11-01 DIAGNOSIS — R739 Hyperglycemia, unspecified: Secondary | ICD-10-CM

## 2013-11-01 DIAGNOSIS — E785 Hyperlipidemia, unspecified: Secondary | ICD-10-CM | POA: Diagnosis not present

## 2013-11-01 DIAGNOSIS — I10 Essential (primary) hypertension: Secondary | ICD-10-CM

## 2013-11-01 NOTE — Telephone Encounter (Signed)
Dr. Charlett Blake   This pt came into today for labs work, please could you put in the patients blood work with diagnosis code.  If you use the telephone not please order labs in future .  Thank you

## 2013-11-02 LAB — RENAL FUNCTION PANEL
Albumin: 3.2 g/dL — ABNORMAL LOW (ref 3.5–5.2)
BUN: 17 mg/dL (ref 6–23)
CHLORIDE: 108 meq/L (ref 96–112)
CO2: 23 mEq/L (ref 19–32)
Calcium: 8.8 mg/dL (ref 8.4–10.5)
Creatinine, Ser: 1.3 mg/dL (ref 0.4–1.5)
GFR: 57.73 mL/min — AB (ref >60.00)
Glucose, Bld: 91 mg/dL (ref 70–99)
PHOSPHORUS: 2.8 mg/dL (ref 2.3–4.6)
POTASSIUM: 4 meq/L (ref 3.5–5.1)
SODIUM: 139 meq/L (ref 135–145)

## 2013-11-02 LAB — LIPID PANEL
CHOL/HDL RATIO: 3
Cholesterol: 115 mg/dL (ref 0–200)
HDL: 42.2 mg/dL (ref >39.00)
LDL Cholesterol: 54 mg/dL (ref 0–99)
NONHDL: 72.8
Triglycerides: 93 mg/dL (ref 0.0–149.0)
VLDL: 18.6 mg/dL (ref 0.0–40.0)

## 2013-11-02 LAB — HEPATIC FUNCTION PANEL
ALT: 37 U/L (ref 0–53)
AST: 43 U/L — ABNORMAL HIGH (ref 0–37)
Albumin: 3.2 g/dL — ABNORMAL LOW (ref 3.5–5.2)
Alkaline Phosphatase: 111 U/L (ref 39–117)
BILIRUBIN DIRECT: 0.2 mg/dL (ref 0.0–0.3)
BILIRUBIN TOTAL: 0.9 mg/dL (ref 0.2–1.2)
Total Protein: 6.9 g/dL (ref 6.0–8.3)

## 2013-11-02 LAB — CBC
HCT: 45.7 % (ref 39.0–52.0)
Hemoglobin: 14.8 g/dL (ref 13.0–17.0)
MCHC: 32.4 g/dL (ref 30.0–36.0)
MCV: 101.4 fl — AB (ref 78.0–100.0)
Platelets: 135 10*3/uL — ABNORMAL LOW (ref 150.0–400.0)
RBC: 4.51 Mil/uL (ref 4.22–5.81)
RDW: 13.4 % (ref 11.5–15.5)
WBC: 6.5 10*3/uL (ref 4.0–10.5)

## 2013-11-02 LAB — TSH: TSH: 0.94 u[IU]/mL (ref 0.35–4.50)

## 2013-11-02 LAB — HEMOGLOBIN A1C: HEMOGLOBIN A1C: 5.2 % (ref 4.6–6.5)

## 2013-11-08 ENCOUNTER — Encounter: Payer: Self-pay | Admitting: Family Medicine

## 2013-11-08 ENCOUNTER — Telehealth: Payer: Self-pay | Admitting: Family Medicine

## 2013-11-08 ENCOUNTER — Ambulatory Visit (INDEPENDENT_AMBULATORY_CARE_PROVIDER_SITE_OTHER): Payer: Medicare Other | Admitting: Family Medicine

## 2013-11-08 VITALS — BP 131/47 | HR 59 | Temp 97.8°F | Ht 70.0 in | Wt 189.4 lb

## 2013-11-08 DIAGNOSIS — Z0000 Encounter for general adult medical examination without abnormal findings: Secondary | ICD-10-CM

## 2013-11-08 DIAGNOSIS — E785 Hyperlipidemia, unspecified: Secondary | ICD-10-CM

## 2013-11-08 DIAGNOSIS — I10 Essential (primary) hypertension: Secondary | ICD-10-CM

## 2013-11-08 DIAGNOSIS — K6289 Other specified diseases of anus and rectum: Secondary | ICD-10-CM

## 2013-11-08 DIAGNOSIS — E039 Hypothyroidism, unspecified: Secondary | ICD-10-CM

## 2013-11-08 NOTE — Progress Notes (Signed)
Pre visit review using our clinic review tool, if applicable. No additional management support is needed unless otherwise documented below in the visit note. 

## 2013-11-08 NOTE — Telephone Encounter (Signed)
Caller name: Craig  Relation to pt: self  Call back number: [phone]   Reason for call: pt requesting lab work please send to Dr. Donnetta Hutching (p) [phone] and (f) [phone]

## 2013-11-08 NOTE — Progress Notes (Signed)
Patient ID: Fernando Liu, male   DOB: 1926/12/05, 80 y.o.   MRN: [id number] Saleem Coccia [id number] Feb 09, 1926 11/08/2013      Progress Note-Follow Up  Subjective  Chief Complaint  Chief Complaint  Patient presents with  . Annual Exam    medicare wellness    HPI  Patient is a 80 year old male in today for routine medical care. In today for annual exam. Doing fairly well but continues to still with stool incontinence or rectal irritation secondary to his history. Has intermittent loose stool and denies any pattern. No bloody or tarry stool. No abdominal pain or fevers. No recent illness. Denies CP/palp/SOB/HA/congestion/fevers/GI or GU c/o. Taking meds as prescribed  Past Medical History  Diagnosis Date  . Hypertension   . High cholesterol   . Thyroid disease   . CAD (coronary artery disease)   . Chicken pox 80 yrs old  . Measles as a child  . Preventative health care 10/31/2012    Past Surgical History  Procedure Laterality Date  . Hernia repair    . Pelvic abcess drainage      states surgery affected control of BM  . Tonsillectomy    . Cataract extraction, bilateral      August & November 2012    Family History  Problem Relation Age of Onset  . Stroke Mother   . Hypertension Mother   . Breast cancer Neg Hx   . Heart disease Neg Hx   . Diabetes Neg Hx   . Colon cancer Neg Hx   . Prostate cancer Neg Hx     History   Social History  . Marital Status: Married    Spouse Name: N/A    Number of Children: N/A  . Years of Education: N/A   Occupational History  . Not on file.   Social History Main Topics  . Smoking status: Never Smoker   . Smokeless tobacco: Never Used  . Alcohol Use: 1.2 oz/week    2 Shots of liquor per week     Comment: daily  . Drug Use: No  . Sexual Activity: Not on file   Other Topics Concern  . Not on file   Social History Narrative  . No narrative on file    Current Outpatient Prescriptions on File Prior to Visit  Medication  Sig Dispense Refill  . atorvastatin (LIPITOR) 10 MG tablet TAKE 1 TABLET BY MOUTH AT BEDTIME  30 tablet  2  . clopidogrel (PLAVIX) 75 MG tablet Take 75 mg by mouth daily.       Marland Kitchen levothyroxine (SYNTHROID, LEVOTHROID) 75 MCG tablet TAKE 1 TABLET EVERY DAY  30 tablet  3  . lisinopril (PRINIVIL,ZESTRIL) 10 MG tablet TAKE 1 TABLET DAILY FOR BLOOD PRESSURE.  30 tablet  3  . metoprolol tartrate (LOPRESSOR) 25 MG tablet Take 1 tablet (25 mg total) by mouth daily.  30 tablet  5  . nitroGLYCERIN (NITROSTAT) 0.4 MG SL tablet Place 0.4 mg under the tongue every 5 (five) minutes as needed. For chest pain       No current facility-administered medications on file prior to visit.    Allergies  Allergen Reactions  . Aspirin Swelling    Review of Systems  Review of Systems  Constitutional: Negative for fever, chills and malaise/fatigue.  HENT: Negative for congestion, hearing loss and nosebleeds.   Eyes: Negative for discharge.  Respiratory: Negative for cough, sputum production, shortness of breath and wheezing.   Cardiovascular: Negative for chest pain,  palpitations and leg swelling.  Gastrointestinal: Positive for diarrhea and blood in stool. Negative for heartburn, nausea, vomiting, abdominal pain, constipation and melena.       Rarely sees blood in stool when it is loose which happens roughly once a week.   Genitourinary: Negative for dysuria, urgency, frequency and hematuria.  Musculoskeletal: Negative for back pain, falls and myalgias.  Skin: Negative for rash.  Neurological: Negative for dizziness, tremors, sensory change, focal weakness, loss of consciousness, weakness and headaches.  Endo/Heme/Allergies: Negative for polydipsia. Does not bruise/bleed easily.  Psychiatric/Behavioral: Negative for depression and suicidal ideas. The patient is not nervous/anxious and does not have insomnia.     Objective  BP 131/47  Pulse 59  Temp(Src) 97.8 F (36.6 C) (Oral)  Ht 5\' 10"  (1.778 m)  Wt  189 lb 6.4 oz (85.911 kg)  BMI 27.18 kg/m2  SpO2 98%  Physical Exam  Physical Exam  Constitutional: He is oriented to person, place, and time and well-developed, well-nourished, and in no distress. No distress.  HENT:  Head: Normocephalic and atraumatic.  Eyes: Conjunctivae are normal.  Neck: Neck supple. No thyromegaly present.  Cardiovascular: Normal rate, regular rhythm and normal heart sounds.   No murmur heard. Pulmonary/Chest: Effort normal and breath sounds normal. No respiratory distress.  Abdominal: He exhibits no distension and no mass. There is no tenderness.  Musculoskeletal: He exhibits no edema.  Neurological: He is alert and oriented to person, place, and time.  Skin: Skin is warm.  Psychiatric: Memory, affect and judgment normal.    Lab Results  Component Value Date   TSH 0.94 11/02/2013   Lab Results  Component Value Date   WBC 6.5 11/02/2013   HGB 14.8 11/02/2013   HCT 45.7 11/02/2013   MCV 101.4* 11/02/2013   PLT 135.0* 11/02/2013   Lab Results  Component Value Date   CREATININE 1.3 11/02/2013   BUN 17 11/02/2013   NA 139 11/02/2013   K 4.0 11/02/2013   CL 108 11/02/2013   CO2 23 11/02/2013   Lab Results  Component Value Date   ALT 37 11/02/2013   AST 43* 11/02/2013   ALKPHOS 111 11/02/2013   BILITOT 0.9 11/02/2013   Lab Results  Component Value Date   CHOL 115 11/02/2013   Lab Results  Component Value Date   HDL 42.20 11/02/2013   Lab Results  Component Value Date   LDLCALC 54 11/02/2013   Lab Results  Component Value Date   TRIG 93.0 11/02/2013   Lab Results  Component Value Date   CHOLHDL 3 11/02/2013     Assessment & Plan  HTN (hypertension) Well controlled, no changes to meds. Encouraged heart healthy diet such as the DASH diet and exercise as tolerated.   Hypothyroidism On Levothyroxine, continue to monitor  Hyperlipidemia Tolerating statin, encouraged heart healthy diet, avoid trans fats, minimize simple carbs and saturated fats.  Increase exercise as tolerated  Medicare annual wellness visit, subsequent Patient denies any difficulties at home. No trouble with ADLs, depression or falls. No recent changes to vision or hearing. Is UTD with immunizations. Is UTD with screening. Discussed Advanced Directives, patient agrees to bring [place] copies of documents if can. Encouraged heart healthy diet, exercise as tolerated and adequate sleep. Declines flu shot today. Living will wife and daughter have a copy, does not want heroic measures Sees cardiology his recent cardiologist is retiring his new doctor will be at [employer].   Perirectal skin irritation Given IFOB test to perform today, has  trouble with sphincter control and struggles with incontinence. May need to return to gastroenterology

## 2013-11-08 NOTE — Patient Instructions (Signed)
Probiotics such daily such as Digestive Advantage or Phillip's colon health Add Benefiber powder twice daily, 64 oz of clear fluids daily   Hypertension Hypertension, commonly called high blood pressure, is when the force of blood pumping through your arteries is too strong. Your arteries are the blood vessels that carry blood from your heart throughout your body. A blood pressure reading consists of a higher number over a lower number, such as 110/72. The higher number (systolic) is the pressure inside your arteries when your heart pumps. The lower number (diastolic) is the pressure inside your arteries when your heart relaxes. Ideally you want your blood pressure below 120/80. Hypertension forces your heart to work harder to pump blood. Your arteries may become narrow or stiff. Having hypertension puts you at risk for heart disease, stroke, and other problems.  RISK FACTORS Some risk factors for high blood pressure are controllable. Others are not.  Risk factors you cannot control include:   Race. You may be at higher risk if you are African American.  Age. Risk increases with age.  Gender. Men are at higher risk than women before age 66 years. After age 36, women are at higher risk than men. Risk factors you can control include:  Not getting enough exercise or physical activity.  Being overweight.  Getting too much fat, sugar, calories, or salt in your diet.  Drinking too much alcohol. SIGNS AND SYMPTOMS Hypertension does not usually cause signs or symptoms. Extremely high blood pressure (hypertensive crisis) may cause headache, anxiety, shortness of breath, and nosebleed. DIAGNOSIS  To check if you have hypertension, your health care provider will measure your blood pressure while you are seated, with your arm held at the level of your heart. It should be measured at least twice using the same arm. Certain conditions can cause a difference in blood pressure between your right and left  arms. A blood pressure reading that is higher than normal on one occasion does not mean that you need treatment. If one blood pressure reading is high, ask your health care provider about having it checked again. TREATMENT  Treating high blood pressure includes making lifestyle changes and possibly taking medicine. Living a healthy lifestyle can help lower high blood pressure. You may need to change some of your habits. Lifestyle changes may include:  Following the DASH diet. This diet is high in fruits, vegetables, and whole grains. It is low in salt, red meat, and added sugars.  Getting at least 2 hours of brisk physical activity every week.  Losing weight if necessary.  Not smoking.  Limiting alcoholic beverages.  Learning ways to reduce stress. If lifestyle changes are not enough to get your blood pressure under control, your health care provider may prescribe medicine. You may need to take more than one. Work closely with your health care provider to understand the risks and benefits. HOME CARE INSTRUCTIONS  Have your blood pressure rechecked as directed by your health care provider.   Take medicines only as directed by your health care provider. Follow the directions carefully. Blood pressure medicines must be taken as prescribed. The medicine does not work as well when you skip doses. Skipping doses also puts you at risk for problems.   Do not smoke.   Monitor your blood pressure at home as directed by your health care provider. SEEK MEDICAL CARE IF:   You think you are having a reaction to medicines taken.  You have recurrent headaches or feel dizzy.  You have  swelling in your ankles.  You have trouble with your vision. SEEK IMMEDIATE MEDICAL CARE IF:  You develop a severe headache or confusion.  You have unusual weakness, numbness, or feel faint.  You have severe chest or abdominal pain.  You vomit repeatedly.  You have trouble breathing. MAKE SURE YOU:     Understand these instructions.  Will watch your condition.  Will get help right away if you are not doing well or get worse. Document Released: 01/11/2005 Document Revised: 05/28/2013 Document Reviewed: 11/03/2012 [facility] Patient Information 2015 [place], [place]. This information is not intended to replace advice given to you by your health care provider. Make sure you discuss any questions you have with your health care provider.

## 2013-11-09 NOTE — Telephone Encounter (Signed)
Lab results faxed per patient request.

## 2013-11-11 ENCOUNTER — Encounter: Payer: Self-pay | Admitting: Family Medicine

## 2013-11-11 NOTE — Assessment & Plan Note (Signed)
Given IFOB test to perform today, has trouble with sphincter control and struggles with incontinence. May need to return to gastroenterology

## 2013-11-11 NOTE — Assessment & Plan Note (Signed)
Patient denies any difficulties at home. No trouble with ADLs, depression or falls. No recent changes to vision or hearing. Is UTD with immunizations. Is UTD with screening. Discussed Advanced Directives, patient agrees to bring [place] copies of documents if can. Encouraged heart healthy diet, exercise as tolerated and adequate sleep. Declines flu shot today. Living will wife and daughter have a copy, does not want heroic measures Sees cardiology his recent cardiologist is retiring his new doctor will be at [employer].

## 2013-11-11 NOTE — Assessment & Plan Note (Signed)
Well controlled, no changes to meds. Encouraged heart healthy diet such as the DASH diet and exercise as tolerated.  °

## 2013-11-11 NOTE — Assessment & Plan Note (Signed)
On Levothyroxine, continue to monitor 

## 2013-11-11 NOTE — Assessment & Plan Note (Signed)
Tolerating statin, encouraged heart healthy diet, avoid trans fats, minimize simple carbs and saturated fats. Increase exercise as tolerated 

## 2013-11-13 ENCOUNTER — Other Ambulatory Visit: Payer: Medicare Other

## 2013-11-14 ENCOUNTER — Other Ambulatory Visit (INDEPENDENT_AMBULATORY_CARE_PROVIDER_SITE_OTHER): Payer: Medicare Other

## 2013-11-14 ENCOUNTER — Other Ambulatory Visit: Payer: Medicare Other

## 2013-11-14 DIAGNOSIS — Z1211 Encounter for screening for malignant neoplasm of colon: Secondary | ICD-10-CM

## 2013-11-14 LAB — FECAL OCCULT BLOOD, IMMUNOCHEMICAL: FECAL OCCULT BLD: POSITIVE — AB

## 2013-11-14 NOTE — Addendum Note (Signed)
Addended by: Modena Morrow D on: 11/14/2013 10:15 AM   Modules accepted: Orders

## 2013-11-21 ENCOUNTER — Other Ambulatory Visit: Payer: Self-pay | Admitting: Family Medicine

## 2013-11-21 NOTE — Telephone Encounter (Signed)
Blood done.

## 2013-12-31 DIAGNOSIS — R194 Change in bowel habit: Secondary | ICD-10-CM | POA: Diagnosis not present

## 2013-12-31 DIAGNOSIS — K921 Melena: Secondary | ICD-10-CM | POA: Diagnosis not present

## 2013-12-31 DIAGNOSIS — R159 Full incontinence of feces: Secondary | ICD-10-CM | POA: Diagnosis not present

## 2014-01-14 DIAGNOSIS — H1851 Endothelial corneal dystrophy: Secondary | ICD-10-CM | POA: Diagnosis not present

## 2014-01-14 DIAGNOSIS — H26492 Other secondary cataract, left eye: Secondary | ICD-10-CM | POA: Diagnosis not present

## 2014-01-14 DIAGNOSIS — Z961 Presence of intraocular lens: Secondary | ICD-10-CM | POA: Diagnosis not present

## 2014-01-14 DIAGNOSIS — H3554 Dystrophies primarily involving the retinal pigment epithelium: Secondary | ICD-10-CM | POA: Diagnosis not present

## 2014-01-14 DIAGNOSIS — H52223 Regular astigmatism, bilateral: Secondary | ICD-10-CM | POA: Diagnosis not present

## 2014-01-19 ENCOUNTER — Other Ambulatory Visit: Payer: Self-pay | Admitting: Family Medicine

## 2014-01-22 NOTE — Telephone Encounter (Signed)
Rx sent to the pharmacy by e-script.//AB/CMA 

## 2014-01-24 ENCOUNTER — Other Ambulatory Visit: Payer: Self-pay | Admitting: Family Medicine

## 2014-01-24 NOTE — Telephone Encounter (Signed)
Med filled.  

## 2014-02-17 ENCOUNTER — Other Ambulatory Visit: Payer: Self-pay | Admitting: Family Medicine

## 2014-02-26 DIAGNOSIS — I2510 Atherosclerotic heart disease of native coronary artery without angina pectoris: Secondary | ICD-10-CM | POA: Diagnosis not present

## 2014-02-26 DIAGNOSIS — I10 Essential (primary) hypertension: Secondary | ICD-10-CM | POA: Diagnosis not present

## 2014-02-26 DIAGNOSIS — E785 Hyperlipidemia, unspecified: Secondary | ICD-10-CM | POA: Diagnosis not present

## 2014-02-26 DIAGNOSIS — I209 Angina pectoris, unspecified: Secondary | ICD-10-CM | POA: Diagnosis not present

## 2014-02-26 DIAGNOSIS — I440 Atrioventricular block, first degree: Secondary | ICD-10-CM | POA: Diagnosis not present

## 2014-04-15 ENCOUNTER — Other Ambulatory Visit: Payer: Self-pay | Admitting: Family Medicine

## 2014-04-15 NOTE — Telephone Encounter (Signed)
Med filled.  

## 2014-05-10 ENCOUNTER — Ambulatory Visit (INDEPENDENT_AMBULATORY_CARE_PROVIDER_SITE_OTHER): Payer: Medicare Other | Admitting: Family Medicine

## 2014-05-10 ENCOUNTER — Encounter: Payer: Self-pay | Admitting: Family Medicine

## 2014-05-10 VITALS — BP 128/68 | HR 65 | Temp 97.7°F | Resp 18 | Ht 70.0 in | Wt 191.0 lb

## 2014-05-10 DIAGNOSIS — E782 Mixed hyperlipidemia: Secondary | ICD-10-CM | POA: Diagnosis not present

## 2014-05-10 DIAGNOSIS — E038 Other specified hypothyroidism: Secondary | ICD-10-CM | POA: Diagnosis not present

## 2014-05-10 DIAGNOSIS — I10 Essential (primary) hypertension: Secondary | ICD-10-CM | POA: Diagnosis not present

## 2014-05-10 DIAGNOSIS — E785 Hyperlipidemia, unspecified: Secondary | ICD-10-CM

## 2014-05-10 DIAGNOSIS — R739 Hyperglycemia, unspecified: Secondary | ICD-10-CM

## 2014-05-10 DIAGNOSIS — K922 Gastrointestinal hemorrhage, unspecified: Secondary | ICD-10-CM

## 2014-05-10 LAB — TSH: TSH: 2.34 u[IU]/mL (ref 0.35–4.50)

## 2014-05-10 LAB — COMPREHENSIVE METABOLIC PANEL
ALBUMIN: 3.7 g/dL (ref 3.5–5.2)
ALK PHOS: 134 U/L — AB (ref 39–117)
ALT: 27 U/L (ref 0–53)
AST: 31 U/L (ref 0–37)
BILIRUBIN TOTAL: 0.9 mg/dL (ref 0.2–1.2)
BUN: 17 mg/dL (ref 6–23)
CO2: 27 mEq/L (ref 19–32)
Calcium: 9.3 mg/dL (ref 8.4–10.5)
Chloride: 104 mEq/L (ref 96–112)
Creatinine, Ser: 1.31 mg/dL (ref 0.40–1.50)
GFR: 55.13 mL/min — ABNORMAL LOW (ref >60.00)
Glucose, Bld: 104 mg/dL — ABNORMAL HIGH (ref 70–99)
POTASSIUM: 4.5 meq/L (ref 3.5–5.1)
SODIUM: 138 meq/L (ref 135–145)
TOTAL PROTEIN: 6.8 g/dL (ref 6.0–8.3)

## 2014-05-10 LAB — CBC WITH DIFFERENTIAL/PLATELET
Basophils Absolute: 0 10*3/uL (ref 0.0–0.1)
Basophils Relative: 0.4 % (ref 0.0–3.0)
EOS PCT: 8.2 % — AB (ref 0.0–5.0)
Eosinophils Absolute: 0.7 10*3/uL (ref 0.0–0.7)
HCT: 46.8 % (ref 39.0–52.0)
HEMOGLOBIN: 15.9 g/dL (ref 13.0–17.0)
LYMPHS PCT: 15.7 % (ref 12.0–46.0)
Lymphs Abs: 1.4 10*3/uL (ref 0.7–4.0)
MCHC: 33.9 g/dL (ref 30.0–36.0)
MCV: 96.9 fl (ref 78.0–100.0)
MONOS PCT: 8.4 % (ref 3.0–12.0)
Monocytes Absolute: 0.7 10*3/uL (ref 0.1–1.0)
NEUTROS PCT: 67.3 % (ref 43.0–77.0)
Neutro Abs: 5.8 10*3/uL (ref 1.4–7.7)
PLATELETS: 168 10*3/uL (ref 150.0–400.0)
RBC: 4.83 Mil/uL (ref 4.22–5.81)
RDW: 13.1 % (ref 11.5–15.5)
WBC: 8.6 10*3/uL (ref 4.0–10.5)

## 2014-05-10 LAB — LIPID PANEL
Cholesterol: 112 mg/dL (ref 0–200)
HDL: 50.6 mg/dL (ref >39.00)
LDL CALC: 42 mg/dL (ref 0–99)
NonHDL: 61.4
Total CHOL/HDL Ratio: 2
Triglycerides: 99 mg/dL (ref 0.0–149.0)
VLDL: 19.8 mg/dL (ref 0.0–40.0)

## 2014-05-10 MED ORDER — LISINOPRIL 10 MG PO TABS: ORAL_TABLET | ORAL | Status: DC

## 2014-05-10 MED ORDER — METOPROLOL TARTRATE 25 MG PO TABS: 25.0000 mg | ORAL_TABLET | Freq: Every day | ORAL | Status: DC

## 2014-05-10 MED ORDER — LEVOTHYROXINE SODIUM 75 MCG PO TABS: 75.0000 ug | ORAL_TABLET | Freq: Every day | ORAL | Status: DC

## 2014-05-10 MED ORDER — ATORVASTATIN CALCIUM 10 MG PO TABS: 10.0000 mg | ORAL_TABLET | Freq: Every day | ORAL | Status: DC

## 2014-05-10 NOTE — Progress Notes (Signed)
Pre visit review using our clinic review tool, if applicable. No additional management support is needed unless otherwise documented below in the visit note. 

## 2014-05-10 NOTE — Patient Instructions (Signed)
Rel of Rec Dr Donnetta Hutching cardiology in [facility]  Rel of Rec Dr Alice Reichert, gastroenterology in [facility] notes for last 2 years  Hypertension Hypertension, commonly called high blood pressure, is when the force of blood pumping through your arteries is too strong. Your arteries are the blood vessels that carry blood from your heart throughout your body. A blood pressure reading consists of a higher number over a lower number, such as 110/72. The higher number (systolic) is the pressure inside your arteries when your heart pumps. The lower number (diastolic) is the pressure inside your arteries when your heart relaxes. Ideally you want your blood pressure below 120/80. Hypertension forces your heart to work harder to pump blood. Your arteries may become narrow or stiff. Having hypertension puts you at risk for heart disease, stroke, and other problems.  RISK FACTORS Some risk factors for high blood pressure are controllable. Others are not.  Risk factors you cannot control include:   Race. You may be at higher risk if you are African American.  Age. Risk increases with age.  Gender. Men are at higher risk than women before age 15 years. After age 60, women are at higher risk than men. Risk factors you can control include:  Not getting enough exercise or physical activity.  Being overweight.  Getting too much fat, sugar, calories, or salt in your diet.  Drinking too much alcohol. SIGNS AND SYMPTOMS Hypertension does not usually cause signs or symptoms. Extremely high blood pressure (hypertensive crisis) may cause headache, anxiety, shortness of breath, and nosebleed. DIAGNOSIS  To check if you have hypertension, your health care provider will measure your blood pressure while you are seated, with your arm held at the level of your heart. It should be measured at least twice using the same arm. Certain conditions can cause a difference in blood pressure between your right and left arms. A blood pressure reading  that is higher than normal on one occasion does not mean that you need treatment. If one blood pressure reading is high, ask your health care provider about having it checked again. TREATMENT  Treating high blood pressure includes making lifestyle changes and possibly taking medicine. Living a healthy lifestyle can help lower high blood pressure. You may need to change some of your habits. Lifestyle changes may include:  Following the DASH diet. This diet is high in fruits, vegetables, and whole grains. It is low in salt, red meat, and added sugars.  Getting at least 2 hours of brisk physical activity every week.  Losing weight if necessary.  Not smoking.  Limiting alcoholic beverages.  Learning ways to reduce stress. If lifestyle changes are not enough to get your blood pressure under control, your health care provider may prescribe medicine. You may need to take more than one. Work closely with your health care provider to understand the risks and benefits. HOME CARE INSTRUCTIONS  Have your blood pressure rechecked as directed by your health care provider.   Take medicines only as directed by your health care provider. Follow the directions carefully. Blood pressure medicines must be taken as prescribed. The medicine does not work as well when you skip doses. Skipping doses also puts you at risk for problems.   Do not smoke.   Monitor your blood pressure at home as directed by your health care provider. SEEK MEDICAL CARE IF:   You think you are having a reaction to medicines taken.  You have recurrent headaches or feel dizzy.  You have swelling  in your ankles.  You have trouble with your vision. SEEK IMMEDIATE MEDICAL CARE IF:  You develop a severe headache or confusion.  You have unusual weakness, numbness, or feel faint.  You have severe chest or abdominal pain.  You vomit repeatedly.  You have trouble breathing. MAKE SURE YOU:   Understand these  instructions.  Will watch your condition.  Will get help right away if you are not doing well or get worse. Document Released: 01/11/2005 Document Revised: 05/28/2013 Document Reviewed: 11/03/2012 [facility] Patient Information 2015 [place], [place]. This information is not intended to replace advice given to you by your health care provider. Make sure you discuss any questions you have with your health care provider.

## 2014-05-11 ENCOUNTER — Other Ambulatory Visit: Payer: Self-pay | Admitting: Family Medicine

## 2014-05-16 ENCOUNTER — Encounter: Payer: Self-pay | Admitting: Family Medicine

## 2014-05-16 NOTE — Progress Notes (Signed)
Fernando Liu  [id number] [birth year] 05/16/2014      Progress Note-Follow Up  Subjective  Chief Complaint  Chief Complaint  Patient presents with  . Follow-up    6 mo    HPI  Patient is a 81 y.o. male in today for routine medical care.  Patient is in for follow-up feeling well. Has had no episodes of GI bleeding nor any further diarrhea. He is following with Dr. Alice Reichert. No abdominal pain or fevers. Overall he feels well. Continues to follow with cardiology Dr. Darral Dash. Denies CP/palp/SOB/HA/congestion/fevers/GI or GU c/o. Taking meds as prescribed Past Medical History  Diagnosis Date  . Hypertension   . High cholesterol   . Thyroid disease   . CAD (coronary artery disease)   . Chicken pox 81 yrs old  . Measles as a child  . Preventative health care 10/31/2012  . Medicare annual wellness visit, subsequent 10/31/2012    Living will wife and daughter have a copy, does not want heroic measures Sees cardiology his recent cardiologist is retiring his new doctor will be at [employer]     Past Surgical History  Procedure Laterality Date  . Hernia repair    . Pelvic abcess drainage      states surgery affected control of BM  . Tonsillectomy    . Cataract extraction, bilateral      August & November 2012    Family History  Problem Relation Age of Onset  . Stroke Mother   . Hypertension Mother   . Breast cancer Neg Hx   . Diabetes Neg Hx   . Colon cancer Neg Hx   . Prostate cancer Neg Hx   . Cancer Maternal Grandmother   . Heart disease Father   . Cancer Daughter     History   Social History  . Marital Status: Married    Spouse Name: N/A  . Number of Children: N/A  . Years of Education: N/A   Occupational History  . Not on file.   Social History Main Topics  . Smoking status: Never Smoker   . Smokeless tobacco: Never Used  . Alcohol Use: 1.2 oz/week    2 Shots of liquor per week     Comment: daily  . Drug Use: No  . Sexual Activity: Not on file   Other  Topics Concern  . Not on file   Social History Narrative    Current Outpatient Prescriptions on File Prior to Visit  Medication Sig Dispense Refill  . clopidogrel (PLAVIX) 75 MG tablet Take 75 mg by mouth daily.     . nitroGLYCERIN (NITROSTAT) 0.4 MG SL tablet Place 0.4 mg under the tongue every 5 (five) minutes as needed. For chest pain     No current facility-administered medications on file prior to visit.    Allergies  Allergen Reactions  . Aspirin Swelling    Review of Systems  Review of Systems  Constitutional: Negative for fever and malaise/fatigue.  HENT: Negative for congestion.   Eyes: Negative for discharge.  Respiratory: Negative for shortness of breath.   Cardiovascular: Negative for chest pain, palpitations and leg swelling.  Gastrointestinal: Negative for nausea, abdominal pain and diarrhea.  Genitourinary: Negative for dysuria.  Musculoskeletal: Negative for falls.  Skin: Negative for rash.  Neurological: Negative for loss of consciousness and headaches.  Endo/Heme/Allergies: Negative for polydipsia.  Psychiatric/Behavioral: Negative for depression and suicidal ideas. The patient is not nervous/anxious and does not have insomnia.     Objective  BP 128/68  mmHg  Pulse 65  Temp(Src) 97.7 F (36.5 C) (Oral)  Resp 18  Ht 5\' 10"  (1.778 m)  Wt 191 lb (86.637 kg)  BMI 27.41 kg/m2  SpO2 98%  Physical Exam  Physical Exam  Constitutional: He is oriented to person, place, and time and well-developed, well-nourished, and in no distress. No distress.  HENT:  Head: Normocephalic and atraumatic.  Eyes: Conjunctivae are normal.  Neck: Neck supple. No thyromegaly present.  Cardiovascular: Normal rate, regular rhythm and normal heart sounds.   No murmur heard. Pulmonary/Chest: Effort normal and breath sounds normal. No respiratory distress.  Abdominal: He exhibits no distension and no mass. There is no tenderness.  Musculoskeletal: He exhibits no edema.    Neurological: He is alert and oriented to person, place, and time.  Skin: Skin is warm.  Psychiatric: Memory, affect and judgment normal.  Nursing note reviewed.   Lab Results  Component Value Date   TSH 2.34 05/10/2014   Lab Results  Component Value Date   WBC 8.6 05/10/2014   HGB 15.9 05/10/2014   HCT 46.8 05/10/2014   MCV 96.9 05/10/2014   PLT 168.0 05/10/2014   Lab Results  Component Value Date   CREATININE 1.31 05/10/2014   BUN 17 05/10/2014   NA 138 05/10/2014   K 4.5 05/10/2014   CL 104 05/10/2014   CO2 27 05/10/2014   Lab Results  Component Value Date   ALT 27 05/10/2014   AST 31 05/10/2014   ALKPHOS 134* 05/10/2014   BILITOT 0.9 05/10/2014   Lab Results  Component Value Date   CHOL 112 05/10/2014   Lab Results  Component Value Date   HDL 50.60 05/10/2014   Lab Results  Component Value Date   LDLCALC 42 05/10/2014   Lab Results  Component Value Date   TRIG 99.0 05/10/2014   Lab Results  Component Value Date   CHOLHDL 2 05/10/2014     Assessment & Plan  HTN (hypertension) Well controlled, no changes to meds. Encouraged heart healthy diet such as the DASH diet and exercise as tolerated.    Hypothyroidism On Levothyroxine, continue to monitor   Hyperlipidemia Tolerating statin, encouraged heart healthy diet, avoid trans fats, minimize simple carbs and saturated fats. Increase exercise as tolerated   Hyperglycemia hgba1c acceptable, minimize simple carbs. Increase exercise as tolerated. Continue current meds   GI bleed Following with Dr Alice Reichert at [facility]. No recent concerns.

## 2014-05-16 NOTE — Assessment & Plan Note (Signed)
On Levothyroxine, continue to monitor 

## 2014-05-16 NOTE — Assessment & Plan Note (Signed)
Well controlled, no changes to meds. Encouraged heart healthy diet such as the DASH diet and exercise as tolerated.  °

## 2014-05-16 NOTE — Assessment & Plan Note (Signed)
Tolerating statin, encouraged heart healthy diet, avoid trans fats, minimize simple carbs and saturated fats. Increase exercise as tolerated 

## 2014-05-16 NOTE — Assessment & Plan Note (Signed)
hgba1c acceptable, minimize simple carbs. Increase exercise as tolerated. Continue current meds 

## 2014-05-16 NOTE — Assessment & Plan Note (Signed)
Following with Dr Alice Reichert at [facility]. No recent concerns.

## 2014-09-03 DIAGNOSIS — I440 Atrioventricular block, first degree: Secondary | ICD-10-CM | POA: Diagnosis not present

## 2014-09-03 DIAGNOSIS — E785 Hyperlipidemia, unspecified: Secondary | ICD-10-CM | POA: Diagnosis not present

## 2014-09-03 DIAGNOSIS — I10 Essential (primary) hypertension: Secondary | ICD-10-CM | POA: Diagnosis not present

## 2014-09-03 DIAGNOSIS — I209 Angina pectoris, unspecified: Secondary | ICD-10-CM | POA: Diagnosis not present

## 2014-10-20 ENCOUNTER — Other Ambulatory Visit: Payer: Self-pay | Admitting: Family Medicine

## 2014-11-12 ENCOUNTER — Encounter: Payer: PRIVATE HEALTH INSURANCE | Admitting: Family Medicine

## 2014-11-25 ENCOUNTER — Encounter: Payer: PRIVATE HEALTH INSURANCE | Admitting: Family Medicine

## 2015-02-04 ENCOUNTER — Telehealth: Payer: Self-pay | Admitting: Family Medicine

## 2015-02-04 DIAGNOSIS — Z79899 Other long term (current) drug therapy: Secondary | ICD-10-CM | POA: Diagnosis not present

## 2015-02-04 DIAGNOSIS — M1A9XX1 Chronic gout, unspecified, with tophus (tophi): Secondary | ICD-10-CM | POA: Diagnosis not present

## 2015-02-04 DIAGNOSIS — I10 Essential (primary) hypertension: Secondary | ICD-10-CM | POA: Diagnosis not present

## 2015-02-04 DIAGNOSIS — N179 Acute kidney failure, unspecified: Secondary | ICD-10-CM | POA: Diagnosis not present

## 2015-02-04 DIAGNOSIS — M868X7 Other osteomyelitis, ankle and foot: Secondary | ICD-10-CM | POA: Diagnosis not present

## 2015-02-04 DIAGNOSIS — M868X6 Other osteomyelitis, lower leg: Secondary | ICD-10-CM | POA: Diagnosis not present

## 2015-02-04 DIAGNOSIS — M25551 Pain in right hip: Secondary | ICD-10-CM | POA: Diagnosis not present

## 2015-02-04 DIAGNOSIS — S8991XA Unspecified injury of right lower leg, initial encounter: Secondary | ICD-10-CM | POA: Diagnosis not present

## 2015-02-04 DIAGNOSIS — S82142A Displaced bicondylar fracture of left tibia, initial encounter for closed fracture: Secondary | ICD-10-CM | POA: Diagnosis not present

## 2015-02-04 DIAGNOSIS — Z7902 Long term (current) use of antithrombotics/antiplatelets: Secondary | ICD-10-CM | POA: Diagnosis not present

## 2015-02-04 DIAGNOSIS — M25561 Pain in right knee: Secondary | ICD-10-CM | POA: Diagnosis not present

## 2015-02-04 NOTE — Telephone Encounter (Signed)
Patient Name: Fernando Liu  DOB: 11/09/1926    Initial Comment Caller states her husband fell yesterday on the ice, hip pain, and chest pain.   Nurse Assessment  Nurse: Roosvelt Maser, RN, Barnetta Chapel Date/Time (Eastern Time): 02/04/2015 10:43:16 AM  Confirm and document reason for call. If symptomatic, describe symptoms. ---wife states husband slipped on the ice yesterday and is complaining of hip and knee pain. he is taking several medications (she doesnt know what they are) and is not sure if he should be seen or not, he is able to walk but with much difficulty and he was unsteady to begin with, now is using an old pair of crutches. asked to speak with the pt and wife states she went outside so he wouldnt know she called, he is refusing to go anywhere and will not speak with me. advised wife that he should be seen as soon as possible and evaluated. she states he is refusing and she can not make him do anything.  Has the patient traveled out of the country within the last 30 days? ---Not Applicable  Does the patient have any new or worsening symptoms? ---Yes  Will a triage be completed? ---No  Select reason for no triage. ---Patient declined     Guidelines    Guideline Title Affirmed Question Affirmed Notes       Final Disposition User   Clinical Call Smiley, RN, Barnetta Chapel

## 2015-02-04 NOTE — Telephone Encounter (Signed)
Message routed to PCP for review.

## 2015-02-07 DIAGNOSIS — M868X7 Other osteomyelitis, ankle and foot: Secondary | ICD-10-CM | POA: Diagnosis present

## 2015-02-07 DIAGNOSIS — N179 Acute kidney failure, unspecified: Secondary | ICD-10-CM | POA: Diagnosis present

## 2015-02-07 DIAGNOSIS — Z79899 Other long term (current) drug therapy: Secondary | ICD-10-CM | POA: Diagnosis not present

## 2015-02-07 DIAGNOSIS — M109 Gout, unspecified: Secondary | ICD-10-CM | POA: Diagnosis not present

## 2015-02-07 DIAGNOSIS — M869 Osteomyelitis, unspecified: Secondary | ICD-10-CM | POA: Diagnosis not present

## 2015-02-07 DIAGNOSIS — M868X6 Other osteomyelitis, lower leg: Secondary | ICD-10-CM | POA: Diagnosis not present

## 2015-02-07 DIAGNOSIS — S82141D Displaced bicondylar fracture of right tibia, subsequent encounter for closed fracture with routine healing: Secondary | ICD-10-CM | POA: Diagnosis not present

## 2015-02-07 DIAGNOSIS — M8610 Other acute osteomyelitis, unspecified site: Secondary | ICD-10-CM | POA: Diagnosis not present

## 2015-02-07 DIAGNOSIS — M86672 Other chronic osteomyelitis, left ankle and foot: Secondary | ICD-10-CM | POA: Diagnosis not present

## 2015-02-07 DIAGNOSIS — Z7902 Long term (current) use of antithrombotics/antiplatelets: Secondary | ICD-10-CM | POA: Diagnosis not present

## 2015-02-07 DIAGNOSIS — M1A9XX1 Chronic gout, unspecified, with tophus (tophi): Secondary | ICD-10-CM | POA: Diagnosis not present

## 2015-02-07 DIAGNOSIS — N170 Acute kidney failure with tubular necrosis: Secondary | ICD-10-CM | POA: Diagnosis not present

## 2015-02-07 DIAGNOSIS — I10 Essential (primary) hypertension: Secondary | ICD-10-CM | POA: Diagnosis not present

## 2015-02-07 DIAGNOSIS — M79672 Pain in left foot: Secondary | ICD-10-CM | POA: Diagnosis not present

## 2015-02-07 DIAGNOSIS — E785 Hyperlipidemia, unspecified: Secondary | ICD-10-CM | POA: Diagnosis not present

## 2015-02-11 DIAGNOSIS — Z89422 Acquired absence of other left toe(s): Secondary | ICD-10-CM | POA: Diagnosis not present

## 2015-02-11 DIAGNOSIS — S82101D Unspecified fracture of upper end of right tibia, subsequent encounter for closed fracture with routine healing: Secondary | ICD-10-CM | POA: Diagnosis not present

## 2015-02-11 DIAGNOSIS — M869 Osteomyelitis, unspecified: Secondary | ICD-10-CM | POA: Diagnosis not present

## 2015-02-11 DIAGNOSIS — I10 Essential (primary) hypertension: Secondary | ICD-10-CM | POA: Diagnosis not present

## 2015-02-11 DIAGNOSIS — Z4781 Encounter for orthopedic aftercare following surgical amputation: Secondary | ICD-10-CM | POA: Diagnosis not present

## 2015-02-11 DIAGNOSIS — I440 Atrioventricular block, first degree: Secondary | ICD-10-CM | POA: Diagnosis not present

## 2015-02-13 DIAGNOSIS — Z89422 Acquired absence of other left toe(s): Secondary | ICD-10-CM | POA: Diagnosis not present

## 2015-02-13 DIAGNOSIS — M869 Osteomyelitis, unspecified: Secondary | ICD-10-CM | POA: Diagnosis not present

## 2015-02-13 DIAGNOSIS — I440 Atrioventricular block, first degree: Secondary | ICD-10-CM | POA: Diagnosis not present

## 2015-02-13 DIAGNOSIS — I10 Essential (primary) hypertension: Secondary | ICD-10-CM | POA: Diagnosis not present

## 2015-02-13 DIAGNOSIS — S82101D Unspecified fracture of upper end of right tibia, subsequent encounter for closed fracture with routine healing: Secondary | ICD-10-CM | POA: Diagnosis not present

## 2015-02-13 DIAGNOSIS — Z4781 Encounter for orthopedic aftercare following surgical amputation: Secondary | ICD-10-CM | POA: Diagnosis not present

## 2015-02-18 DIAGNOSIS — Z4781 Encounter for orthopedic aftercare following surgical amputation: Secondary | ICD-10-CM | POA: Diagnosis not present

## 2015-02-18 DIAGNOSIS — I440 Atrioventricular block, first degree: Secondary | ICD-10-CM | POA: Diagnosis not present

## 2015-02-18 DIAGNOSIS — S82101D Unspecified fracture of upper end of right tibia, subsequent encounter for closed fracture with routine healing: Secondary | ICD-10-CM | POA: Diagnosis not present

## 2015-02-18 DIAGNOSIS — Z89422 Acquired absence of other left toe(s): Secondary | ICD-10-CM | POA: Diagnosis not present

## 2015-02-18 DIAGNOSIS — I10 Essential (primary) hypertension: Secondary | ICD-10-CM | POA: Diagnosis not present

## 2015-02-18 DIAGNOSIS — M869 Osteomyelitis, unspecified: Secondary | ICD-10-CM | POA: Diagnosis not present

## 2015-02-19 DIAGNOSIS — Z4781 Encounter for orthopedic aftercare following surgical amputation: Secondary | ICD-10-CM | POA: Diagnosis not present

## 2015-02-21 DIAGNOSIS — H26492 Other secondary cataract, left eye: Secondary | ICD-10-CM | POA: Diagnosis not present

## 2015-02-21 DIAGNOSIS — Z8249 Family history of ischemic heart disease and other diseases of the circulatory system: Secondary | ICD-10-CM | POA: Diagnosis not present

## 2015-02-21 DIAGNOSIS — H3554 Dystrophies primarily involving the retinal pigment epithelium: Secondary | ICD-10-CM | POA: Diagnosis not present

## 2015-02-21 DIAGNOSIS — Z83518 Family history of other specified eye disorder: Secondary | ICD-10-CM | POA: Diagnosis not present

## 2015-02-21 DIAGNOSIS — I10 Essential (primary) hypertension: Secondary | ICD-10-CM | POA: Diagnosis not present

## 2015-02-21 DIAGNOSIS — E039 Hypothyroidism, unspecified: Secondary | ICD-10-CM | POA: Diagnosis not present

## 2015-02-21 DIAGNOSIS — Z961 Presence of intraocular lens: Secondary | ICD-10-CM | POA: Diagnosis not present

## 2015-02-21 DIAGNOSIS — Z83511 Family history of glaucoma: Secondary | ICD-10-CM | POA: Diagnosis not present

## 2015-02-21 DIAGNOSIS — H1851 Endothelial corneal dystrophy: Secondary | ICD-10-CM | POA: Diagnosis not present

## 2015-02-21 DIAGNOSIS — H52223 Regular astigmatism, bilateral: Secondary | ICD-10-CM | POA: Diagnosis not present

## 2015-02-25 DIAGNOSIS — Z4781 Encounter for orthopedic aftercare following surgical amputation: Secondary | ICD-10-CM | POA: Diagnosis not present

## 2015-02-25 DIAGNOSIS — I10 Essential (primary) hypertension: Secondary | ICD-10-CM | POA: Diagnosis not present

## 2015-02-25 DIAGNOSIS — Z89422 Acquired absence of other left toe(s): Secondary | ICD-10-CM | POA: Diagnosis not present

## 2015-02-25 DIAGNOSIS — I440 Atrioventricular block, first degree: Secondary | ICD-10-CM | POA: Diagnosis not present

## 2015-02-25 DIAGNOSIS — S82101D Unspecified fracture of upper end of right tibia, subsequent encounter for closed fracture with routine healing: Secondary | ICD-10-CM | POA: Diagnosis not present

## 2015-02-25 DIAGNOSIS — M869 Osteomyelitis, unspecified: Secondary | ICD-10-CM | POA: Diagnosis not present

## 2015-02-27 DIAGNOSIS — L609 Nail disorder, unspecified: Secondary | ICD-10-CM | POA: Diagnosis not present

## 2015-02-27 DIAGNOSIS — M21612 Bunion of left foot: Secondary | ICD-10-CM | POA: Diagnosis not present

## 2015-02-27 DIAGNOSIS — M21611 Bunion of right foot: Secondary | ICD-10-CM | POA: Diagnosis not present

## 2015-02-27 DIAGNOSIS — Z89422 Acquired absence of other left toe(s): Secondary | ICD-10-CM | POA: Diagnosis not present

## 2015-02-28 DIAGNOSIS — I440 Atrioventricular block, first degree: Secondary | ICD-10-CM | POA: Diagnosis not present

## 2015-02-28 DIAGNOSIS — M869 Osteomyelitis, unspecified: Secondary | ICD-10-CM | POA: Diagnosis not present

## 2015-02-28 DIAGNOSIS — Z89422 Acquired absence of other left toe(s): Secondary | ICD-10-CM | POA: Diagnosis not present

## 2015-02-28 DIAGNOSIS — S82101D Unspecified fracture of upper end of right tibia, subsequent encounter for closed fracture with routine healing: Secondary | ICD-10-CM | POA: Diagnosis not present

## 2015-02-28 DIAGNOSIS — I10 Essential (primary) hypertension: Secondary | ICD-10-CM | POA: Diagnosis not present

## 2015-02-28 DIAGNOSIS — Z4781 Encounter for orthopedic aftercare following surgical amputation: Secondary | ICD-10-CM | POA: Diagnosis not present

## 2015-03-06 DIAGNOSIS — I2510 Atherosclerotic heart disease of native coronary artery without angina pectoris: Secondary | ICD-10-CM | POA: Diagnosis not present

## 2015-03-06 DIAGNOSIS — N170 Acute kidney failure with tubular necrosis: Secondary | ICD-10-CM | POA: Diagnosis not present

## 2015-03-06 DIAGNOSIS — I10 Essential (primary) hypertension: Secondary | ICD-10-CM | POA: Diagnosis not present

## 2015-03-06 DIAGNOSIS — E785 Hyperlipidemia, unspecified: Secondary | ICD-10-CM | POA: Diagnosis not present

## 2015-03-06 DIAGNOSIS — I440 Atrioventricular block, first degree: Secondary | ICD-10-CM | POA: Diagnosis not present

## 2015-03-11 DIAGNOSIS — S82141D Displaced bicondylar fracture of right tibia, subsequent encounter for closed fracture with routine healing: Secondary | ICD-10-CM | POA: Diagnosis not present

## 2015-03-11 DIAGNOSIS — M25561 Pain in right knee: Secondary | ICD-10-CM | POA: Diagnosis not present

## 2015-03-20 DIAGNOSIS — H3554 Dystrophies primarily involving the retinal pigment epithelium: Secondary | ICD-10-CM | POA: Diagnosis not present

## 2015-04-02 DIAGNOSIS — R159 Full incontinence of feces: Secondary | ICD-10-CM | POA: Diagnosis not present

## 2015-04-02 DIAGNOSIS — K921 Melena: Secondary | ICD-10-CM | POA: Diagnosis not present

## 2015-04-11 ENCOUNTER — Encounter: Payer: PRIVATE HEALTH INSURANCE | Admitting: Family Medicine

## 2015-04-12 ENCOUNTER — Other Ambulatory Visit: Payer: Self-pay | Admitting: Family Medicine

## 2015-04-13 NOTE — Telephone Encounter (Signed)
He has an appt late May. Please give him refill on meds to cover til then so we can line refills up with appt. #[phone] with 2 rf on all

## 2015-04-22 DIAGNOSIS — S82141D Displaced bicondylar fracture of right tibia, subsequent encounter for closed fracture with routine healing: Secondary | ICD-10-CM | POA: Diagnosis not present

## 2015-04-23 ENCOUNTER — Telehealth: Payer: Self-pay | Admitting: Family Medicine

## 2015-04-23 NOTE — Telephone Encounter (Signed)
Scheduled for 04/24/15

## 2015-04-23 NOTE — Telephone Encounter (Signed)
Can be reached: [phone] Pharmacy: [id number] - [place], [place]  Reason for call: Pt states he recently had 2nd toe next to big toe on left foot removed due to gout (January 2017 - Dr. Mechele Claude). He said he was told he should be on a medication for gout. Pt is worried about meds due to kidney issues. Dr. Mechele Claude didn't prescribe bc he only does f/u from surgery. Pt had surgery at [facility].

## 2015-04-23 NOTE — Telephone Encounter (Signed)
Pt needs an appt. Please call and schedule.   

## 2015-04-24 ENCOUNTER — Encounter: Payer: Self-pay | Admitting: Family Medicine

## 2015-04-24 ENCOUNTER — Ambulatory Visit (INDEPENDENT_AMBULATORY_CARE_PROVIDER_SITE_OTHER): Payer: Medicare Other | Admitting: Family Medicine

## 2015-04-24 VITALS — BP 122/64 | HR 66 | Temp 97.7°F | Ht 70.0 in | Wt 179.0 lb

## 2015-04-24 DIAGNOSIS — E039 Hypothyroidism, unspecified: Secondary | ICD-10-CM

## 2015-04-24 DIAGNOSIS — M109 Gout, unspecified: Secondary | ICD-10-CM

## 2015-04-24 DIAGNOSIS — I10 Essential (primary) hypertension: Secondary | ICD-10-CM

## 2015-04-24 DIAGNOSIS — R739 Hyperglycemia, unspecified: Secondary | ICD-10-CM

## 2015-04-24 DIAGNOSIS — E785 Hyperlipidemia, unspecified: Secondary | ICD-10-CM

## 2015-04-24 HISTORY — DX: Gout, unspecified: M10.9

## 2015-04-24 LAB — COMPREHENSIVE METABOLIC PANEL
ALBUMIN: 3.5 g/dL (ref 3.5–5.2)
ALK PHOS: 127 U/L — AB (ref 39–117)
ALT: 23 U/L (ref 0–53)
AST: 31 U/L (ref 0–37)
BUN: 22 mg/dL (ref 6–23)
CALCIUM: 9.1 mg/dL (ref 8.4–10.5)
CHLORIDE: 108 meq/L (ref 96–112)
CO2: 26 mEq/L (ref 19–32)
Creatinine, Ser: 1.54 mg/dL — ABNORMAL HIGH (ref 0.40–1.50)
GFR: 45.64 mL/min — AB (ref >60.00)
Glucose, Bld: 104 mg/dL — ABNORMAL HIGH (ref 70–99)
POTASSIUM: 4.6 meq/L (ref 3.5–5.1)
Sodium: 139 mEq/L (ref 135–145)
TOTAL PROTEIN: 6.4 g/dL (ref 6.0–8.3)
Total Bilirubin: 0.8 mg/dL (ref 0.2–1.2)

## 2015-04-24 LAB — HEMOGLOBIN A1C: HEMOGLOBIN A1C: 5.3 % (ref 4.6–6.5)

## 2015-04-24 LAB — CBC
HCT: 40.1 % (ref 39.0–52.0)
HEMOGLOBIN: 13.4 g/dL (ref 13.0–17.0)
MCHC: 33.4 g/dL (ref 30.0–36.0)
MCV: 94.8 fl (ref 78.0–100.0)
PLATELETS: 174 10*3/uL (ref 150.0–400.0)
RBC: 4.23 Mil/uL (ref 4.22–5.81)
RDW: 15.6 % — ABNORMAL HIGH (ref 11.5–15.5)
WBC: 7.6 10*3/uL (ref 4.0–10.5)

## 2015-04-24 LAB — LIPID PANEL
CHOLESTEROL: 109 mg/dL (ref 0–200)
HDL: 48.2 mg/dL (ref >39.00)
LDL CALC: 38 mg/dL (ref 0–99)
NonHDL: 60.34
TRIGLYCERIDES: 110 mg/dL (ref 0.0–149.0)
Total CHOL/HDL Ratio: 2
VLDL: 22 mg/dL (ref 0.0–40.0)

## 2015-04-24 LAB — URIC ACID: URIC ACID, SERUM: 9 mg/dL — AB (ref 4.0–7.8)

## 2015-04-24 LAB — TSH: TSH: 2.8 u[IU]/mL (ref 0.35–4.50)

## 2015-04-24 NOTE — Patient Instructions (Signed)

## 2015-04-24 NOTE — Progress Notes (Signed)
Pre visit review using our clinic review tool, if applicable. No additional management support is needed unless otherwise documented below in the visit note. 

## 2015-04-24 NOTE — Assessment & Plan Note (Signed)
Had a fall in January 2017 on ice, fractured his Tibial plateau in right leg. 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 Will check uric acid, encouraged to increase hydration

## 2015-04-24 NOTE — Assessment & Plan Note (Signed)
On Levothyroxine, continue to monitor 

## 2015-04-24 NOTE — Assessment & Plan Note (Signed)
hgba1c acceptable, minimize simple carbs. Increase exercise as tolerated.  

## 2015-04-24 NOTE — Assessment & Plan Note (Signed)
Well controlled, no changes to meds. Encouraged heart healthy diet such as the DASH diet and exercise as tolerated.  °

## 2015-04-24 NOTE — Progress Notes (Signed)
Subjective:    Patient ID: Fernando Liu, male    DOB: [birth year], 82 y.o.   MRN: [id number]  Chief Complaint  Patient presents with  . Gout    HPI Patient is in today for Gout pain.  Patient was recently diagnosed with Osteomyelitis and was given an antibiotic has some concerns that he may gout.  Had a fall in January 2017 on ice, fractured his Tibial plateau in right leg. 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 Denies CP/palp/SOB/HA/congestion/fevers/GI or GU c/o. Taking meds as prescribed  Past Medical History  Diagnosis Date  . Hypertension   . High cholesterol   . Thyroid disease   . CAD (coronary artery disease)   . Chicken pox 82 yrs old  . Measles as a child  . Preventative health care 10/31/2012  . Medicare annual wellness visit, subsequent 10/31/2012    Living will wife and daughter have a copy, does not want heroic measures Sees cardiology his recent cardiologist is retiring his new doctor will be at [employer]   . Gout 04/24/2015    Past Surgical History  Procedure Laterality Date  . Hernia repair    . Pelvic abcess drainage      states surgery affected control of BM  . Tonsillectomy    . Cataract extraction, bilateral      August & November 2012    Family History  Problem Relation Age of Onset  . Stroke Mother   . Hypertension Mother   . Breast cancer Neg Hx   . Diabetes Neg Hx   . Colon cancer Neg Hx   . Prostate cancer Neg Hx   . Cancer Maternal Grandmother   . Heart disease Father   . Cancer Daughter     Social History   Social History  . Marital Status: Married    Spouse Name: N/A  . Number of Children: N/A  . Years of Education: N/A   Occupational History  . Not on file.   Social History Main Topics  . Smoking status: Never Smoker   . Smokeless tobacco: Never Used  . Alcohol Use: 1.2 oz/week    2 Shots of liquor per week     Comment:  daily  . Drug Use: No  . Sexual Activity: Not on file   Other Topics Concern  . Not on file   Social History Narrative    Outpatient Prescriptions Prior to Visit  Medication Sig Dispense Refill  . atorvastatin (LIPITOR) 10 MG tablet TAKE 1 TABLET BY MOUTH AT BEDTIME 30 tablet 2  . clopidogrel (PLAVIX) 75 MG tablet Take 75 mg by mouth daily.     Marland Kitchen levothyroxine (SYNTHROID, LEVOTHROID) 75 MCG tablet TAKE 1 TABLET BY MOUTH EVERY DAY 30 tablet 5  . levothyroxine (SYNTHROID, LEVOTHROID) 75 MCG tablet TAKE 1 TABLET BY MOUTH EVERY DAY 30 tablet 2  . lisinopril (PRINIVIL,ZESTRIL) 10 MG tablet TAKE 1 TABLET DAILY FOR BLOOD PRESSURE. 30 tablet 2  . metoprolol tartrate (LOPRESSOR) 25 MG tablet TAKE 1 TABLET BY MOUTH EVERY DAY 30 tablet 2  . MICRONIZED COLESTIPOL HCL 1 G tablet Take 1 g by mouth 2 (two) times daily.  2  . nitroGLYCERIN (NITROSTAT) 0.4 MG SL tablet Place 0.4 mg under the tongue every 5 (five) minutes as needed. For chest pain     No facility-administered medications prior to visit.    Allergies  Allergen Reactions  .  Aspirin Swelling    Review of Systems  Constitutional: Negative for fever and malaise/fatigue.  HENT: Negative for congestion.   Eyes: Negative for blurred vision.  Respiratory: Negative for shortness of breath.   Cardiovascular: Negative for chest pain, palpitations and leg swelling.  Gastrointestinal: Negative for nausea, abdominal pain and blood in stool.  Genitourinary: Negative for dysuria and frequency.  Musculoskeletal: Negative for falls.  Skin: Negative for rash.  Neurological: Negative for dizziness, loss of consciousness and headaches.  Endo/Heme/Allergies: Negative for environmental allergies.  Psychiatric/Behavioral: Negative for depression. The patient is not nervous/anxious.        Objective:    Physical Exam  Constitutional: He is oriented to person, place, and time. He appears well-developed and well-nourished. No distress.  HENT:    Head: Normocephalic and atraumatic.  Nose: Nose normal.  Eyes: Right eye exhibits no discharge. Left eye exhibits no discharge.  Neck: Normal range of motion. Neck supple.  Cardiovascular: Normal rate and regular rhythm.   No murmur heard. Pulmonary/Chest: Effort normal and breath sounds normal.  Abdominal: Soft. Bowel sounds are normal. There is no tenderness.  Musculoskeletal: He exhibits no edema.  Right second toe surgically absent  Neurological: He is alert and oriented to person, place, and time.  Skin: Skin is warm and dry.  Psychiatric: He has a normal mood and affect.  Nursing note and vitals reviewed.   BP 122/64 mmHg  Pulse 66  Temp(Src) 97.7 F (36.5 C) (Oral)  Ht 5\' 10"  (1.778 m)  Wt 179 lb (81.194 kg)  BMI 25.68 kg/m2  SpO2 99% Wt Readings from Last 3 Encounters:  04/24/15 179 lb (81.194 kg)  05/10/14 191 lb (86.637 kg)  11/08/13 189 lb 6.4 oz (85.911 kg)     Lab Results  Component Value Date   WBC 8.6 05/10/2014   HGB 15.9 05/10/2014   HCT 46.8 05/10/2014   PLT 168.0 05/10/2014   GLUCOSE 104* 05/10/2014   CHOL 112 05/10/2014   TRIG 99.0 05/10/2014   HDL 50.60 05/10/2014   LDLCALC 42 05/10/2014   ALT 27 05/10/2014   AST 31 05/10/2014   NA 138 05/10/2014   K 4.5 05/10/2014   CL 104 05/10/2014   CREATININE 1.31 05/10/2014   BUN 17 05/10/2014   CO2 27 05/10/2014   TSH 2.34 05/10/2014   PSA 0.62 04/30/2013   HGBA1C 5.2 11/02/2013    Lab Results  Component Value Date   TSH 2.34 05/10/2014   Lab Results  Component Value Date   WBC 8.6 05/10/2014   HGB 15.9 05/10/2014   HCT 46.8 05/10/2014   MCV 96.9 05/10/2014   PLT 168.0 05/10/2014   Lab Results  Component Value Date   NA 138 05/10/2014   K 4.5 05/10/2014   CO2 27 05/10/2014   GLUCOSE 104* 05/10/2014   BUN 17 05/10/2014   CREATININE 1.31 05/10/2014   BILITOT 0.9 05/10/2014   ALKPHOS 134* 05/10/2014   AST 31 05/10/2014   ALT 27 05/10/2014   PROT 6.8 05/10/2014   ALBUMIN 3.7  05/10/2014   CALCIUM 9.3 05/10/2014   GFR 55.13* 05/10/2014   Lab Results  Component Value Date   CHOL 112 05/10/2014   Lab Results  Component Value Date   HDL 50.60 05/10/2014   Lab Results  Component Value Date   LDLCALC 42 05/10/2014   Lab Results  Component Value Date   TRIG 99.0 05/10/2014   Lab Results  Component Value Date   CHOLHDL 2 05/10/2014  Lab Results  Component Value Date   HGBA1C 5.2 11/02/2013       Assessment & Plan:   Problem List Items Addressed This Visit    Gout - Primary    Had a fall in January 2017 on ice, fractured his Tibial plateau in right leg. 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 Will check uric acid, encouraged to increase hydration      HTN (hypertension)    Well controlled, no changes to meds. Encouraged heart healthy diet such as the DASH diet and exercise as tolerated.       Hyperglycemia    hgba1c acceptable, minimize simple carbs. Increase exercise as tolerated.       Hyperlipidemia    Tolerating statin, encouraged heart healthy diet, avoid trans fats, minimize simple carbs and saturated fats. Increase exercise as tolerated      Hypothyroidism    On Levothyroxine, continue to monitor         I am having Mr. Shawhan maintain his clopidogrel, nitroGLYCERIN, MICRONIZED COLESTIPOL HCL, levothyroxine, levothyroxine, atorvastatin, metoprolol tartrate, and lisinopril.  No orders of the defined types were placed in this encounter.     Penni Homans, MD

## 2015-04-24 NOTE — Assessment & Plan Note (Signed)
Tolerating statin, encouraged heart healthy diet, avoid trans fats, minimize simple carbs and saturated fats. Increase exercise as tolerated 

## 2015-04-25 ENCOUNTER — Other Ambulatory Visit: Payer: Self-pay | Admitting: Family Medicine

## 2015-04-25 MED ORDER — ALLOPURINOL 100 MG PO TABS: 100.0000 mg | ORAL_TABLET | Freq: Every day | ORAL | Status: DC

## 2015-06-16 ENCOUNTER — Other Ambulatory Visit (INDEPENDENT_AMBULATORY_CARE_PROVIDER_SITE_OTHER): Payer: Medicare Other

## 2015-06-16 DIAGNOSIS — I10 Essential (primary) hypertension: Secondary | ICD-10-CM | POA: Diagnosis not present

## 2015-06-16 DIAGNOSIS — E785 Hyperlipidemia, unspecified: Secondary | ICD-10-CM

## 2015-06-16 DIAGNOSIS — E039 Hypothyroidism, unspecified: Secondary | ICD-10-CM

## 2015-06-16 DIAGNOSIS — R739 Hyperglycemia, unspecified: Secondary | ICD-10-CM | POA: Diagnosis not present

## 2015-06-16 DIAGNOSIS — M109 Gout, unspecified: Secondary | ICD-10-CM

## 2015-06-16 LAB — COMPREHENSIVE METABOLIC PANEL
ALBUMIN: 3.7 g/dL (ref 3.5–5.2)
ALT: 22 U/L (ref 0–53)
AST: 32 U/L (ref 0–37)
Alkaline Phosphatase: 133 U/L — ABNORMAL HIGH (ref 39–117)
BUN: 20 mg/dL (ref 6–23)
CHLORIDE: 107 meq/L (ref 96–112)
CO2: 26 mEq/L (ref 19–32)
Calcium: 9.2 mg/dL (ref 8.4–10.5)
Creatinine, Ser: 1.33 mg/dL (ref 0.40–1.50)
GFR: 54.03 mL/min — AB (ref >60.00)
Glucose, Bld: 103 mg/dL — ABNORMAL HIGH (ref 70–99)
POTASSIUM: 4.5 meq/L (ref 3.5–5.1)
SODIUM: 139 meq/L (ref 135–145)
Total Bilirubin: 1 mg/dL (ref 0.2–1.2)
Total Protein: 6.6 g/dL (ref 6.0–8.3)

## 2015-06-16 LAB — URIC ACID: Uric Acid, Serum: 6.4 mg/dL (ref 4.0–7.8)

## 2015-06-18 ENCOUNTER — Encounter: Payer: Self-pay | Admitting: *Deleted

## 2015-06-18 ENCOUNTER — Telehealth: Payer: Self-pay | Admitting: *Deleted

## 2015-06-18 NOTE — Telephone Encounter (Signed)
Pre-Visit Call completed with patient and chart updated.   Pre-Visit Info documented in Specialty Comments under [employer].    

## 2015-06-19 ENCOUNTER — Encounter: Payer: Self-pay | Admitting: Family Medicine

## 2015-06-19 ENCOUNTER — Ambulatory Visit (INDEPENDENT_AMBULATORY_CARE_PROVIDER_SITE_OTHER): Payer: Medicare Other | Admitting: Family Medicine

## 2015-06-19 VITALS — BP 108/68 | HR 74 | Temp 98.6°F | Ht 70.0 in | Wt 173.1 lb

## 2015-06-19 DIAGNOSIS — R739 Hyperglycemia, unspecified: Secondary | ICD-10-CM | POA: Diagnosis not present

## 2015-06-19 DIAGNOSIS — Z0000 Encounter for general adult medical examination without abnormal findings: Secondary | ICD-10-CM

## 2015-06-19 DIAGNOSIS — E039 Hypothyroidism, unspecified: Secondary | ICD-10-CM | POA: Diagnosis not present

## 2015-06-19 DIAGNOSIS — E785 Hyperlipidemia, unspecified: Secondary | ICD-10-CM | POA: Diagnosis not present

## 2015-06-19 DIAGNOSIS — I10 Essential (primary) hypertension: Secondary | ICD-10-CM

## 2015-06-19 DIAGNOSIS — M109 Gout, unspecified: Secondary | ICD-10-CM

## 2015-06-19 DIAGNOSIS — Z8739 Personal history of other diseases of the musculoskeletal system and connective tissue: Secondary | ICD-10-CM

## 2015-06-19 DIAGNOSIS — I2510 Atherosclerotic heart disease of native coronary artery without angina pectoris: Secondary | ICD-10-CM

## 2015-06-19 NOTE — Assessment & Plan Note (Signed)
Well controlled, no changes to meds. Encouraged heart healthy diet such as the DASH diet and exercise as tolerated.  °

## 2015-06-19 NOTE — Assessment & Plan Note (Signed)
On Levothyroxine, continue to monitor 

## 2015-06-19 NOTE — Assessment & Plan Note (Signed)
Asymptomatic following with cardiology.

## 2015-06-19 NOTE — Progress Notes (Signed)
Pre visit review using our clinic review tool, if applicable. No additional management support is needed unless otherwise documented below in the visit note. 

## 2015-06-19 NOTE — Assessment & Plan Note (Signed)
Tolerating statin, encouraged heart healthy diet, avoid trans fats, minimize simple carbs and saturated fats. Increase exercise as tolerated 

## 2015-06-19 NOTE — Assessment & Plan Note (Signed)
hgba1c acceptable, minimize simple carbs. Increase exercise as tolerated. Continue current meds 

## 2015-06-19 NOTE — Assessment & Plan Note (Signed)
Good response to Allopurinol with drop in uric acid from 9 to 6.4. Continue same, asymptomatic

## 2015-06-19 NOTE — Patient Instructions (Addendum)
64 oz of clear fluids daily Vasomotor Rhinitis Preventive Care for Adults, Male A healthy lifestyle and preventive care can promote health and wellness. Preventive health guidelines for men include the following key practices:  A routine yearly physical is a good way to check with your health care provider about your health and preventative screening. It is a chance to share any concerns and updates on your health and to receive a thorough exam.  Visit your dentist for a routine exam and preventative care every 6 months. Brush your teeth twice a day and floss once a day. Good oral hygiene prevents tooth decay and gum disease.  The frequency of eye exams is based on your age, health, family medical history, use of contact lenses, and other factors. Follow your health care provider's recommendations for frequency of eye exams.  Eat a healthy diet. Foods such as vegetables, fruits, whole grains, low-fat dairy products, and lean protein foods contain the nutrients you need without too many calories. Decrease your intake of foods high in solid fats, added sugars, and salt. Eat the right amount of calories for you.Get information about a proper diet from your health care provider, if necessary.  Regular physical exercise is one of the most important things you can do for your health. Most adults should get at least 150 minutes of moderate-intensity exercise (any activity that increases your heart rate and causes you to sweat) each week. In addition, most adults need muscle-strengthening exercises on 2 or more days a week.  Maintain a healthy weight. The body mass index (BMI) is a screening tool to identify possible weight problems. It provides an estimate of body fat based on height and weight. Your health care provider can find your BMI and can help you achieve or maintain a healthy weight.For adults 20 years and older:  A BMI below 18.5 is considered underweight.  A BMI of 18.5 to 24.9 is  normal.  A BMI of 25 to 29.9 is considered overweight.  A BMI of 30 and above is considered obese.  Maintain normal blood lipids and cholesterol levels by exercising and minimizing your intake of saturated fat. Eat a balanced diet with plenty of fruit and vegetables. Blood tests for lipids and cholesterol should begin at age 43 and be repeated every 5 years. If your lipid or cholesterol levels are high, you are over 50, or you are at high risk for heart disease, you may need your cholesterol levels checked more frequently.Ongoing high lipid and cholesterol levels should be treated with medicines if diet and exercise are not working.  If you smoke, find out from your health care provider how to quit. If you do not use tobacco, do not start.  Lung cancer screening is recommended for adults aged 45-80 years who are at high risk for developing lung cancer because of a history of smoking. A yearly low-dose CT scan of the lungs is recommended for people who have at least a 30-pack-year history of smoking and are a current smoker or have quit within the past 15 years. A pack year of smoking is smoking an average of 1 pack of cigarettes a day for 1 year (for example: 1 pack a day for 30 years or 2 packs a day for 15 years). Yearly screening should continue until the smoker has stopped smoking for at least 15 years. Yearly screening should be stopped for people who develop a health problem that would prevent them from having lung cancer treatment.  If you  choose to drink alcohol, do not have more than 2 drinks per day. One drink is considered to be 12 ounces (355 mL) of beer, 5 ounces (148 mL) of wine, or 1.5 ounces (44 mL) of liquor.  Avoid use of street drugs. Do not share needles with anyone. Ask for help if you need support or instructions about stopping the use of drugs.  High blood pressure causes heart disease and increases the risk of stroke. Your blood pressure should be checked at least every 1-2  years. Ongoing high blood pressure should be treated with medicines, if weight loss and exercise are not effective.  If you are 26-60 years old, ask your health care provider if you should take aspirin to prevent heart disease.  Diabetes screening is done by taking a blood sample to check your blood glucose level after you have not eaten for a certain period of time (fasting). If you are not overweight and you do not have risk factors for diabetes, you should be screened once every 3 years starting at age 31. If you are overweight or obese and you are 72-82 years of age, you should be screened for diabetes every year as part of your cardiovascular risk assessment.  Colorectal cancer can be detected and often prevented. Most routine colorectal cancer screening begins at the age of 61 and continues through age 28. However, your health care provider may recommend screening at an earlier age if you have risk factors for colon cancer. On a yearly basis, your health care provider may provide home test kits to check for hidden blood in the stool. Use of a small camera at the end of a tube to directly examine the colon (sigmoidoscopy or colonoscopy) can detect the earliest forms of colorectal cancer. Talk to your health care provider about this at age 71, when routine screening begins. Direct exam of the colon should be repeated every 5-10 years through age 38, unless early forms of precancerous polyps or small growths are found.  People who are at an increased risk for hepatitis B should be screened for this virus. You are considered at high risk for hepatitis B if:  You were born in a country where hepatitis B occurs often. Talk with your health care provider about which countries are considered high risk.  Your parents were born in a high-risk country and you have not received a shot to protect against hepatitis B (hepatitis B vaccine).  You have HIV or AIDS.  You use needles to inject street  drugs.  You live with, or have sex with, someone who has hepatitis B.  You are a man who has sex with other men (MSM).  You get hemodialysis treatment.  You take certain medicines for conditions such as cancer, organ transplantation, and autoimmune conditions.  Hepatitis C blood testing is recommended for all people born from [birth year] through 1965 and any individual with known risks for hepatitis C.  Practice safe sex. Use condoms and avoid high-risk sexual practices to reduce the spread of sexually transmitted infections (STIs). STIs include gonorrhea, chlamydia, syphilis, trichomonas, herpes, HPV, and human immunodeficiency virus (HIV). Herpes, HIV, and HPV are viral illnesses that have no cure. They can result in disability, cancer, and death.  If you are a man who has sex with other men, you should be screened at least once per year for:  HIV.  Urethral, rectal, and pharyngeal infection of gonorrhea, chlamydia, or both.  If you are at risk of being infected with  HIV, it is recommended that you take a prescription medicine daily to prevent HIV infection. This is called preexposure prophylaxis (PrEP). You are considered at risk if:  You are a man who has sex with other men (MSM) and have other risk factors.  You are a heterosexual man, are sexually active, and are at increased risk for HIV infection.  You take drugs by injection.  You are sexually active with a partner who has HIV.  Talk with your health care provider about whether you are at high risk of being infected with HIV. If you choose to begin PrEP, you should first be tested for HIV. You should then be tested every 3 months for as long as you are taking PrEP.  A one-time screening for abdominal aortic aneurysm (AAA) and surgical repair of large AAAs by ultrasound are recommended for men ages 84 to 25 years who are current or former smokers.  Healthy men should no longer receive prostate-specific antigen (PSA) blood tests as  part of routine cancer screening. Talk with your health care provider about prostate cancer screening.  Testicular cancer screening is not recommended for adult males who have no symptoms. Screening includes self-exam, a health care provider exam, and other screening tests. Consult with your health care provider about any symptoms you have or any concerns you have about testicular cancer.  Use sunscreen. Apply sunscreen liberally and repeatedly throughout the day. You should seek shade when your shadow is shorter than you. Protect yourself by wearing long sleeves, pants, a wide-brimmed hat, and sunglasses year round, whenever you are outdoors.  Once a month, do a whole-body skin exam, using a mirror to look at the skin on your back. Tell your health care provider about new moles, moles that have irregular borders, moles that are larger than a pencil eraser, or moles that have changed in shape or color.  Stay current with required vaccines (immunizations).  Influenza vaccine. All adults should be immunized every year.  Tetanus, diphtheria, and acellular pertussis (Td, Tdap) vaccine. An adult who has not previously received Tdap or who does not know his vaccine status should receive 1 dose of Tdap. This initial dose should be followed by tetanus and diphtheria toxoids (Td) booster doses every 10 years. Adults with an unknown or incomplete history of completing a 3-dose immunization series with Td-containing vaccines should begin or complete a primary immunization series including a Tdap dose. Adults should receive a Td booster every 10 years.  Varicella vaccine. An adult without evidence of immunity to varicella should receive 2 doses or a second dose if he has previously received 1 dose.  Human papillomavirus (HPV) vaccine. Males aged 11-21 years who have not received the vaccine previously should receive the 3-dose series. Males aged 22-26 years may be immunized. Immunization is recommended through  the age of 28 years for any male who has sex with males and did not get any or all doses earlier. Immunization is recommended for any person with an immunocompromised condition through the age of 88 years if he did not get any or all doses earlier. During the 3-dose series, the second dose should be obtained 4-8 weeks after the first dose. The third dose should be obtained 24 weeks after the first dose and 16 weeks after the second dose.  Zoster vaccine. One dose is recommended for adults aged 83 years or older unless certain conditions are present.  Measles, mumps, and rubella (MMR) vaccine. Adults born before [birth year] generally are considered immune  to measles and mumps. Adults born in [birth year] or later should have 1 or more doses of MMR vaccine unless there is a contraindication to the vaccine or there is laboratory evidence of immunity to each of the three diseases. A routine second dose of MMR vaccine should be obtained at least 28 days after the first dose for students attending postsecondary schools, health care workers, or international travelers. People who received inactivated measles vaccine or an unknown type of measles vaccine during 1963-1967 should receive 2 doses of MMR vaccine. People who received inactivated mumps vaccine or an unknown type of mumps vaccine before 1979 and are at high risk for mumps infection should consider immunization with 2 doses of MMR vaccine. Unvaccinated health care workers born before [birth year] who lack laboratory evidence of measles, mumps, or rubella immunity or laboratory confirmation of disease should consider measles and mumps immunization with 2 doses of MMR vaccine or rubella immunization with 1 dose of MMR vaccine.  Pneumococcal 13-valent conjugate (PCV13) vaccine. When indicated, a person who is uncertain of his immunization history and has no record of immunization should receive the PCV13 vaccine. All adults 46 years of age and older should receive this vaccine. An  adult aged 47 years or older who has certain medical conditions and has not been previously immunized should receive 1 dose of PCV13 vaccine. This PCV13 should be followed with a dose of pneumococcal polysaccharide (PPSV23) vaccine. Adults who are at high risk for pneumococcal disease should obtain the PPSV23 vaccine at least 8 weeks after the dose of PCV13 vaccine. Adults older than 80 years of age who have normal immune system function should obtain the PPSV23 vaccine dose at least 1 year after the dose of PCV13 vaccine.  Pneumococcal polysaccharide (PPSV23) vaccine. When PCV13 is also indicated, PCV13 should be obtained first. All adults aged 32 years and older should be immunized. An adult younger than age 58 years who has certain medical conditions should be immunized. Any person who resides in a nursing home or long-term care facility should be immunized. An adult smoker should be immunized. People with an immunocompromised condition and certain other conditions should receive both PCV13 and PPSV23 vaccines. People with human immunodeficiency virus (HIV) infection should be immunized as soon as possible after diagnosis. Immunization during chemotherapy or radiation therapy should be avoided. Routine use of PPSV23 vaccine is not recommended for American Indians, [place] Natives, or people younger than 65 years unless there are medical conditions that require PPSV23 vaccine. When indicated, people who have unknown immunization and have no record of immunization should receive PPSV23 vaccine. One-time revaccination 5 years after the first dose of PPSV23 is recommended for people aged 19-64 years who have chronic kidney failure, nephrotic syndrome, asplenia, or immunocompromised conditions. People who received 1-2 doses of PPSV23 before age 67 years should receive another dose of PPSV23 vaccine at age 86 years or later if at least 5 years have passed since the previous dose. Doses of PPSV23 are not needed for  people immunized with PPSV23 at or after age 65 years.  Meningococcal vaccine. Adults with asplenia or persistent complement component deficiencies should receive 2 doses of quadrivalent meningococcal conjugate (MenACWY-D) vaccine. The doses should be obtained at least 2 months apart. Microbiologists working with certain meningococcal bacteria, [employer] recruits, people at risk during an outbreak, and people who travel to or live in countries with a high rate of meningitis should be immunized. A first-year college student up through age 59 years who is  living in a residence hall should receive a dose if he did not receive a dose on or after his 16th birthday. Adults who have certain high-risk conditions should receive one or more doses of vaccine.  Hepatitis A vaccine. Adults who wish to be protected from this disease, have chronic liver disease, work with hepatitis A-infected animals, work in hepatitis A research labs, or travel to or work in countries with a high rate of hepatitis A should be immunized. Adults who were previously unvaccinated and who anticipate close contact with an international adoptee during the first 60 days after arrival in the [place] from a country with a high rate of hepatitis A should be immunized.  Hepatitis B vaccine. Adults should be immunized if they wish to be protected from this disease, are under age 34 years and have diabetes, have chronic liver disease, have had more than one sex partner in the past 6 months, may be exposed to blood or other infectious body fluids, are household contacts or sex partners of hepatitis B positive people, are clients or workers in certain care facilities, or travel to or work in countries with a high rate of hepatitis B.  Haemophilus influenzae type b (Hib) vaccine. A previously unvaccinated person with asplenia or sickle cell disease or having a scheduled splenectomy should receive 1 dose of Hib vaccine. Regardless of previous  immunization, a recipient of a hematopoietic stem cell transplant should receive a 3-dose series 6-12 months after his successful transplant. Hib vaccine is not recommended for adults with HIV infection. Preventive Service / Frequency Ages 20 to 72  Blood pressure check.** / Every 3-5 years.  Lipid and cholesterol check.** / Every 5 years beginning at age 54.  Hepatitis C blood test.** / For any individual with known risks for hepatitis C.  Skin self-exam. / Monthly.  Influenza vaccine. / Every year.  Tetanus, diphtheria, and acellular pertussis (Tdap, Td) vaccine.** / Consult your health care provider. 1 dose of Td every 10 years.  Varicella vaccine.** / Consult your health care provider.  HPV vaccine. / 3 doses over 6 months, if 27 or younger.  Measles, mumps, rubella (MMR) vaccine.** / You need at least 1 dose of MMR if you were born in 1957 or later. You may also need a second dose.  Pneumococcal 13-valent conjugate (PCV13) vaccine.** / Consult your health care provider.  Pneumococcal polysaccharide (PPSV23) vaccine.** / 1 to 2 doses if you smoke cigarettes or if you have certain conditions.  Meningococcal vaccine.** / 1 dose if you are age 53 to 68 years and a Market researcher living in a residence hall, or have one of several medical conditions. You may also need additional booster doses.  Hepatitis A vaccine.** / Consult your health care provider.  Hepatitis B vaccine.** / Consult your health care provider.  Haemophilus influenzae type b (Hib) vaccine.** / Consult your health care provider. Ages 74 to 69  Blood pressure check.** / Every year.  Lipid and cholesterol check.** / Every 5 years beginning at age 63.  Lung cancer screening. / Every year if you are aged 82-80 years and have a 30-pack-year history of smoking and currently smoke or have quit within the past 15 years. Yearly screening is stopped once you have quit smoking for at least 15 years or develop  a health problem that would prevent you from having lung cancer treatment.  Fecal occult blood test (FOBT) of stool. / Every year beginning at age 73 and continuing until  age 67. You may not have to do this test if you get a colonoscopy every 10 years.  Flexible sigmoidoscopy** or colonoscopy.** / Every 5 years for a flexible sigmoidoscopy or every 10 years for a colonoscopy beginning at age 80 and continuing until age 47.  Hepatitis C blood test.** / For all people born from [birth year] through 1965 and any individual with known risks for hepatitis C.  Skin self-exam. / Monthly.  Influenza vaccine. / Every year.  Tetanus, diphtheria, and acellular pertussis (Tdap/Td) vaccine.** / Consult your health care provider. 1 dose of Td every 10 years.  Varicella vaccine.** / Consult your health care provider.  Zoster vaccine.** / 1 dose for adults aged 65 years or older.  Measles, mumps, rubella (MMR) vaccine.** / You need at least 1 dose of MMR if you were born in 1957 or later. You may also need a second dose.  Pneumococcal 13-valent conjugate (PCV13) vaccine.** / Consult your health care provider.  Pneumococcal polysaccharide (PPSV23) vaccine.** / 1 to 2 doses if you smoke cigarettes or if you have certain conditions.  Meningococcal vaccine.** / Consult your health care provider.  Hepatitis A vaccine.** / Consult your health care provider.  Hepatitis B vaccine.** / Consult your health care provider.  Haemophilus influenzae type b (Hib) vaccine.** / Consult your health care provider. Ages 2 and over  Blood pressure check.** / Every year.  Lipid and cholesterol check.**/ Every 5 years beginning at age 68.  Lung cancer screening. / Every year if you are aged 47-80 years and have a 30-pack-year history of smoking and currently smoke or have quit within the past 15 years. Yearly screening is stopped once you have quit smoking for at least 15 years or develop a health problem that would prevent  you from having lung cancer treatment.  Fecal occult blood test (FOBT) of stool. / Every year beginning at age 72 and continuing until age 75. You may not have to do this test if you get a colonoscopy every 10 years.  Flexible sigmoidoscopy** or colonoscopy.** / Every 5 years for a flexible sigmoidoscopy or every 10 years for a colonoscopy beginning at age 88 and continuing until age 54.  Hepatitis C blood test.** / For all people born from [birth year] through 1963 and any individual with known risks for hepatitis C.  Abdominal aortic aneurysm (AAA) screening.** / A one-time screening for ages 3 to 58 years who are current or former smokers.  Skin self-exam. / Monthly.  Influenza vaccine. / Every year.  Tetanus, diphtheria, and acellular pertussis (Tdap/Td) vaccine.** / 1 dose of Td every 10 years.  Varicella vaccine.** / Consult your health care provider.  Zoster vaccine.** / 1 dose for adults aged 30 years or older.  Pneumococcal 13-valent conjugate (PCV13) vaccine.** / 1 dose for all adults aged 12 years and older.  Pneumococcal polysaccharide (PPSV23) vaccine.** / 1 dose for all adults aged 60 years and older.  Meningococcal vaccine.** / Consult your health care provider.  Hepatitis A vaccine.** / Consult your health care provider.  Hepatitis B vaccine.** / Consult your health care provider.  Haemophilus influenzae type b (Hib) vaccine.** / Consult your health care provider. **Family history and personal history of risk and conditions may change your health care provider's recommendations.   This information is not intended to replace advice given to you by your health care provider. Make sure you discuss any questions you have with your health care provider.   Document Released: 03/09/2001 Document Revised: 02/01/2014  Document Reviewed: 06/08/2010 [employer]vier Interactive Patient Education [employer].

## 2015-06-19 NOTE — Progress Notes (Signed)
Patient ID: Fernando Liu, male   DOB: [birth year], 82 y.o.   MRN: [id number]   Subjective:    Patient ID: Fernando Liu, male    DOB: [birth year], 82 y.o.   MRN: [id number]  Chief Complaint  Patient presents with  . Annual Exam    HPI Patient is in today for annual exam. Is feeling well today. No recent illness or acute illness. No recent hospitalization. Does not have trouble with ADLs. Is noting a slight decrease in appetite. He does eat daily and eats hungry in am. Denies CP/palp/SOB/HA/congestion/fevers/GI or GU c/o. Taking meds as prescribed   Past Medical History  Diagnosis Date  . Hypertension   . High cholesterol   . Thyroid disease   . CAD (coronary artery disease)   . Chicken pox 82 yrs old  . Measles as a child  . Preventative health care 10/31/2012  . Medicare annual wellness visit, subsequent 10/31/2012    Living will wife and daughter have a copy, does not want heroic measures Sees cardiology his recent cardiologist is retiring his new doctor will be at [employer]   . Gout 04/24/2015    Past Surgical History  Procedure Laterality Date  . Hernia repair    . Pelvic abcess drainage      states surgery affected control of BM  . Tonsillectomy    . Cataract extraction, bilateral      August & November 2012    Family History  Problem Relation Age of Onset  . Stroke Mother   . Hypertension Mother   . Breast cancer Neg Hx   . Diabetes Neg Hx   . Colon cancer Neg Hx   . Prostate cancer Neg Hx   . Cancer Maternal Grandmother   . Heart disease Father   . Cancer Daughter     Social History   Social History  . Marital Status: Married    Spouse Name: N/A  . Number of Children: N/A  . Years of Education: N/A   Occupational History  . Not on file.   Social History Main Topics  . Smoking status: Never Smoker   . Smokeless tobacco: Never Used  . Alcohol Use: 1.2 oz/week    2 Shots of liquor per week     Comment: daily  . Drug Use: No  . Sexual Activity: Not on  file   Other Topics Concern  . Not on file   Social History Narrative    Outpatient Prescriptions Prior to Visit  Medication Sig Dispense Refill  . allopurinol (ZYLOPRIM) 100 MG tablet Take 1 tablet (100 mg total) by mouth daily. 30 tablet 6  . atorvastatin (LIPITOR) 10 MG tablet TAKE 1 TABLET BY MOUTH AT BEDTIME 30 tablet 2  . clopidogrel (PLAVIX) 75 MG tablet Take 75 mg by mouth daily.     Marland Kitchen levothyroxine (SYNTHROID, LEVOTHROID) 75 MCG tablet TAKE 1 TABLET BY MOUTH EVERY DAY 30 tablet 2  . lisinopril (PRINIVIL,ZESTRIL) 10 MG tablet TAKE 1 TABLET DAILY FOR BLOOD PRESSURE. 30 tablet 2  . metoprolol tartrate (LOPRESSOR) 25 MG tablet TAKE 1 TABLET BY MOUTH EVERY DAY 30 tablet 2  . MICRONIZED COLESTIPOL HCL 1 G tablet Take 1 g by mouth 2 (two) times daily.  2  . nitroGLYCERIN (NITROSTAT) 0.4 MG SL tablet Place 0.4 mg under the tongue every 5 (five) minutes as needed. For chest pain    . levothyroxine (SYNTHROID, LEVOTHROID) 75 MCG tablet TAKE 1 TABLET BY MOUTH EVERY DAY 30 tablet 5  No facility-administered medications prior to visit.    Allergies  Allergen Reactions  . Aspirin Swelling    Review of Systems  Constitutional: Negative for fever and malaise/fatigue.  HENT: Negative for congestion.   Eyes: Negative for blurred vision.  Respiratory: Negative for shortness of breath.   Cardiovascular: Negative for chest pain, palpitations and leg swelling.  Gastrointestinal: Negative for nausea, abdominal pain and blood in stool.  Genitourinary: Negative for dysuria and frequency.  Musculoskeletal: Negative for falls.  Skin: Negative for rash.  Neurological: Negative for dizziness, loss of consciousness and headaches.  Endo/Heme/Allergies: Negative for environmental allergies.  Psychiatric/Behavioral: Negative for depression. The patient is not nervous/anxious.        Objective:    Physical Exam  Constitutional: He is oriented to person, place, and time. He appears  well-developed and well-nourished. No distress.  HENT:  Head: Normocephalic and atraumatic.  Eyes: Conjunctivae are normal.  Neck: Neck supple. No thyromegaly present.  Cardiovascular: Normal rate, regular rhythm and normal heart sounds.   No murmur heard. Pulmonary/Chest: Effort normal and breath sounds normal. No respiratory distress. He has no wheezes.  Abdominal: Soft. Bowel sounds are normal. He exhibits no mass. There is no tenderness.  Musculoskeletal: He exhibits no edema.  Lymphadenopathy:    He has no cervical adenopathy.  Neurological: He is alert and oriented to person, place, and time.  Skin: Skin is warm and dry.  Psychiatric: He has a normal mood and affect. His behavior is normal.    BP 108/68 mmHg  Pulse 74  Temp(Src) 98.6 F (37 C) (Oral)  Ht 5\' 10"  (1.778 m)  Wt 173 lb 2 oz (78.529 kg)  BMI 24.84 kg/m2  SpO2 97% Wt Readings from Last 3 Encounters:  06/19/15 173 lb 2 oz (78.529 kg)  04/24/15 179 lb (81.194 kg)  05/10/14 191 lb (86.637 kg)     Lab Results  Component Value Date   WBC 7.6 04/24/2015   HGB 13.4 04/24/2015   HCT 40.1 04/24/2015   PLT 174.0 04/24/2015   GLUCOSE 103* 06/16/2015   CHOL 109 04/24/2015   TRIG 110.0 04/24/2015   HDL 48.20 04/24/2015   LDLCALC 38 04/24/2015   ALT 22 06/16/2015   AST 32 06/16/2015   NA 139 06/16/2015   K 4.5 06/16/2015   CL 107 06/16/2015   CREATININE 1.33 06/16/2015   BUN 20 06/16/2015   CO2 26 06/16/2015   TSH 2.80 04/24/2015   PSA 0.62 04/30/2013   HGBA1C 5.3 04/24/2015    Lab Results  Component Value Date   TSH 2.80 04/24/2015   Lab Results  Component Value Date   WBC 7.6 04/24/2015   HGB 13.4 04/24/2015   HCT 40.1 04/24/2015   MCV 94.8 04/24/2015   PLT 174.0 04/24/2015   Lab Results  Component Value Date   NA 139 06/16/2015   K 4.5 06/16/2015   CO2 26 06/16/2015   GLUCOSE 103* 06/16/2015   BUN 20 06/16/2015   CREATININE 1.33 06/16/2015   BILITOT 1.0 06/16/2015   ALKPHOS 133*  06/16/2015   AST 32 06/16/2015   ALT 22 06/16/2015   PROT 6.6 06/16/2015   ALBUMIN 3.7 06/16/2015   CALCIUM 9.2 06/16/2015   GFR 54.03* 06/16/2015   Lab Results  Component Value Date   CHOL 109 04/24/2015   Lab Results  Component Value Date   HDL 48.20 04/24/2015   Lab Results  Component Value Date   LDLCALC 38 04/24/2015   Lab Results  Component  Value Date   TRIG 110.0 04/24/2015   Lab Results  Component Value Date   CHOLHDL 2 04/24/2015   Lab Results  Component Value Date   HGBA1C 5.3 04/24/2015       Assessment & Plan:   Problem List Items Addressed This Visit    Medicare annual wellness visit, subsequent    Patient denies any difficulties at home. No trouble with ADLs, depression or falls. See EMR for functional status screen and depression screen. No recent changes to vision or hearing. Is UTD with immunizations. Is UTD with screening. Discussed Advanced Directives. Encouraged heart healthy diet, exercise as tolerated and adequate sleep. See patient's problem list for health risk factors to monitor. See AVS for preventative healthcare recommendation schedule. Given and reviewed copy of ACP documents from [employer] and encouraged to complete and return      Relevant Orders   TSH   CBC   Hemoglobin A1c   Lipid panel   Comprehensive metabolic panel   Uric acid   Hypothyroidism - Primary    On Levothyroxine, continue to monitor      Relevant Orders   TSH   CBC   Hemoglobin A1c   Lipid panel   Comprehensive metabolic panel   Uric acid   Hyperlipidemia    Tolerating statin, encouraged heart healthy diet, avoid trans fats, minimize simple carbs and saturated fats. Increase exercise as tolerated      Relevant Orders   TSH   CBC   Hemoglobin A1c   Lipid panel   Comprehensive metabolic panel   Uric acid   Hyperglycemia    hgba1c acceptable, minimize simple carbs. Increase exercise as tolerated. Continue current meds      Relevant  Orders   TSH   CBC   Hemoglobin A1c   Lipid panel   Comprehensive metabolic panel   Uric acid   HTN (hypertension)    Well controlled, no changes to meds. Encouraged heart healthy diet such as the DASH diet and exercise as tolerated.       Relevant Orders   TSH   CBC   Hemoglobin A1c   Lipid panel   Comprehensive metabolic panel   Uric acid   Gout    Good response to Allopurinol with drop in uric acid from 9 to 6.4. Continue same, asymptomatic      Relevant Orders   TSH   CBC   Hemoglobin A1c   Lipid panel   Comprehensive metabolic panel   Uric acid   CAD (coronary artery disease)    Asymptomatic following with cardiology.       Relevant Orders   TSH   CBC   Hemoglobin A1c   Lipid panel   Comprehensive metabolic panel   Uric acid      I am having Mr. Strimple maintain his clopidogrel, nitroGLYCERIN, MICRONIZED COLESTIPOL HCL, levothyroxine, atorvastatin, metoprolol tartrate, lisinopril, and allopurinol.  No orders of the defined types were placed in this encounter.     Penni Homans, MD

## 2015-06-19 NOTE — Assessment & Plan Note (Signed)
Patient denies any difficulties at home. No trouble with ADLs, depression or falls. See EMR for functional status screen and depression screen. No recent changes to vision or hearing. Is UTD with immunizations. Is UTD with screening. Discussed Advanced Directives. Encouraged heart healthy diet, exercise as tolerated and adequate sleep. See patient's problem list for health risk factors to monitor. See AVS for preventative healthcare recommendation schedule. Given and reviewed copy of ACP documents from [employer] Secretary of State and encouraged to complete and return 

## 2015-07-02 ENCOUNTER — Other Ambulatory Visit: Payer: Self-pay | Admitting: Family Medicine

## 2015-09-03 DIAGNOSIS — E785 Hyperlipidemia, unspecified: Secondary | ICD-10-CM | POA: Diagnosis not present

## 2015-09-03 DIAGNOSIS — N183 Chronic kidney disease, stage 3 (moderate): Secondary | ICD-10-CM | POA: Diagnosis not present

## 2015-09-03 DIAGNOSIS — I2510 Atherosclerotic heart disease of native coronary artery without angina pectoris: Secondary | ICD-10-CM | POA: Diagnosis not present

## 2015-09-03 DIAGNOSIS — I10 Essential (primary) hypertension: Secondary | ICD-10-CM | POA: Diagnosis not present

## 2015-09-03 DIAGNOSIS — I440 Atrioventricular block, first degree: Secondary | ICD-10-CM | POA: Diagnosis not present

## 2015-10-21 ENCOUNTER — Ambulatory Visit (HOSPITAL_BASED_OUTPATIENT_CLINIC_OR_DEPARTMENT_OTHER)
Admission: RE | Admit: 2015-10-21 | Discharge: 2015-10-21 | Disposition: A | Discharge status: Home / Self Care | Payer: Medicare Other | Source: Ambulatory Visit | Attending: Physician Assistant | Admitting: Physician Assistant

## 2015-10-21 ENCOUNTER — Encounter: Payer: Self-pay | Admitting: Physician Assistant

## 2015-10-21 ENCOUNTER — Ambulatory Visit (INDEPENDENT_AMBULATORY_CARE_PROVIDER_SITE_OTHER): Payer: Medicare Other | Admitting: Physician Assistant

## 2015-10-21 ENCOUNTER — Other Ambulatory Visit (INDEPENDENT_AMBULATORY_CARE_PROVIDER_SITE_OTHER): Payer: Medicare Other

## 2015-10-21 VITALS — BP 108/56 | HR 73 | Temp 98.3°F | Resp 16 | Ht 70.0 in | Wt 166.1 lb

## 2015-10-21 DIAGNOSIS — E039 Hypothyroidism, unspecified: Secondary | ICD-10-CM | POA: Diagnosis not present

## 2015-10-21 DIAGNOSIS — M109 Gout, unspecified: Secondary | ICD-10-CM

## 2015-10-21 DIAGNOSIS — M25552 Pain in left hip: Secondary | ICD-10-CM | POA: Diagnosis not present

## 2015-10-21 DIAGNOSIS — I70212 Atherosclerosis of native arteries of extremities with intermittent claudication, left leg: Secondary | ICD-10-CM | POA: Insufficient documentation

## 2015-10-21 DIAGNOSIS — Z0000 Encounter for general adult medical examination without abnormal findings: Secondary | ICD-10-CM

## 2015-10-21 DIAGNOSIS — I2510 Atherosclerotic heart disease of native coronary artery without angina pectoris: Secondary | ICD-10-CM

## 2015-10-21 DIAGNOSIS — E785 Hyperlipidemia, unspecified: Secondary | ICD-10-CM

## 2015-10-21 DIAGNOSIS — R739 Hyperglycemia, unspecified: Secondary | ICD-10-CM | POA: Diagnosis not present

## 2015-10-21 DIAGNOSIS — M810 Age-related osteoporosis without current pathological fracture: Secondary | ICD-10-CM | POA: Insufficient documentation

## 2015-10-21 DIAGNOSIS — I10 Essential (primary) hypertension: Secondary | ICD-10-CM

## 2015-10-21 LAB — COMPREHENSIVE METABOLIC PANEL
ALBUMIN: 3.2 g/dL — AB (ref 3.5–5.2)
ALK PHOS: 141 U/L — AB (ref 39–117)
ALT: 20 U/L (ref 0–53)
AST: 30 U/L (ref 0–37)
BUN: 17 mg/dL (ref 6–23)
CALCIUM: 8.5 mg/dL (ref 8.4–10.5)
CHLORIDE: 108 meq/L (ref 96–112)
CO2: 26 mEq/L (ref 19–32)
CREATININE: 1.35 mg/dL (ref 0.40–1.50)
GFR: 53.07 mL/min — ABNORMAL LOW (ref >60.00)
Glucose, Bld: 100 mg/dL — ABNORMAL HIGH (ref 70–99)
POTASSIUM: 4.1 meq/L (ref 3.5–5.1)
SODIUM: 140 meq/L (ref 135–145)
TOTAL PROTEIN: 6.2 g/dL (ref 6.0–8.3)
Total Bilirubin: 1.1 mg/dL (ref 0.2–1.2)

## 2015-10-21 LAB — LIPID PANEL
CHOLESTEROL: 91 mg/dL (ref 0–200)
HDL: 44.3 mg/dL (ref >39.00)
LDL CALC: 32 mg/dL (ref 0–99)
NonHDL: 46.52
TRIGLYCERIDES: 74 mg/dL (ref 0.0–149.0)
Total CHOL/HDL Ratio: 2
VLDL: 14.8 mg/dL (ref 0.0–40.0)

## 2015-10-21 LAB — CBC
HCT: 40.6 % (ref 39.0–52.0)
Hemoglobin: 13.7 g/dL (ref 13.0–17.0)
MCHC: 33.7 g/dL (ref 30.0–36.0)
MCV: 98.5 fl (ref 78.0–100.0)
PLATELETS: 151 10*3/uL (ref 150.0–400.0)
RBC: 4.12 Mil/uL — AB (ref 4.22–5.81)
RDW: 14.5 % (ref 11.5–15.5)
WBC: 6.6 10*3/uL (ref 4.0–10.5)

## 2015-10-21 LAB — HEMOGLOBIN A1C: HEMOGLOBIN A1C: 4.9 % (ref 4.6–6.5)

## 2015-10-21 LAB — TSH: TSH: 2.23 u[IU]/mL (ref 0.35–4.50)

## 2015-10-21 LAB — URIC ACID: URIC ACID, SERUM: 5.8 mg/dL (ref 4.0–7.8)

## 2015-10-21 NOTE — Progress Notes (Addendum)
Patient presents to clinic today c/o 1-2 months of left hip pain sometimes radiating to lumbar back pain. Endorses pain is worse after prolonged standing or walking. Typically relieved by rest but prolonged sitting will cause pain on standing. Pain is described as a soreness.  Ranked up to 7/10.  Has not taken anything for symptoms. Avoids OTC medication.  Past Medical History:  Diagnosis Date  . CAD (coronary artery disease)   . Chicken pox 82 yrs old  . Gout 04/24/2015  . High cholesterol   . Hypertension   . Measles as a child  . Medicare annual wellness visit, subsequent 10/31/2012   Living will wife and daughter have a copy, does not want heroic measures Sees cardiology his recent cardiologist is retiring his new doctor will be at [employer]   . Preventative health care 10/31/2012  . Thyroid disease     Current Outpatient Prescriptions on File Prior to Visit  Medication Sig Dispense Refill  . allopurinol (ZYLOPRIM) 100 MG tablet Take 1 tablet (100 mg total) by mouth daily. 30 tablet 6  . atorvastatin (LIPITOR) 10 MG tablet TAKE 1 TABLET BY MOUTH AT BEDTIME 30 tablet 4  . clopidogrel (PLAVIX) 75 MG tablet Take 75 mg by mouth daily.     Marland Kitchen levothyroxine (SYNTHROID, LEVOTHROID) 75 MCG tablet TAKE 1 TABLET BY MOUTH EVERY DAY 30 tablet 4  . lisinopril (PRINIVIL,ZESTRIL) 10 MG tablet TAKE 1 TABLET DAILY FOR BLOOD PRESSURE. 30 tablet 4  . MICRONIZED COLESTIPOL HCL 1 G tablet Take 1 g by mouth 2 (two) times daily.  2  . nitroGLYCERIN (NITROSTAT) 0.4 MG SL tablet Place 0.4 mg under the tongue every 5 (five) minutes as needed. For chest pain    . metoprolol tartrate (LOPRESSOR) 25 MG tablet TAKE 1 TABLET BY MOUTH EVERY DAY 30 tablet 4   No current facility-administered medications on file prior to visit.     Allergies  Allergen Reactions  . Aspirin Swelling    Family History  Problem Relation Age of Onset  . Stroke Mother   . Hypertension Mother   . Breast cancer Neg Hx   . Diabetes  Neg Hx   . Colon cancer Neg Hx   . Prostate cancer Neg Hx   . Cancer Maternal Grandmother   . Heart disease Father   . Cancer Daughter     Social History   Social History  . Marital status: Married    Spouse name: N/A  . Number of children: N/A  . Years of education: N/A   Social History Main Topics  . Smoking status: Never Smoker  . Smokeless tobacco: Never Used  . Alcohol use 1.2 oz/week    2 Shots of liquor per week     Comment: daily  . Drug use: No  . Sexual activity: Not Asked   Other Topics Concern  . None   Social History Narrative  . None   Review of Systems - See HPI.  All other ROS are negative.  BP (!) 108/56 (Patient Position: Sitting, Cuff Size: Normal)   Pulse 73   Temp 98.3 F (36.8 C) (Oral)   Resp 16   Ht 5\' 10"  (1.778 m)   Wt 166 lb 2 oz (75.4 kg)   SpO2 98%   BMI 23.84 kg/m   Physical Exam  Constitutional: He is well-developed, well-nourished, and in no distress.  HENT:  Head: Normocephalic and atraumatic.  Cardiovascular: Normal rate, regular rhythm, normal heart sounds and intact distal  pulses.   Pulmonary/Chest: Effort normal and breath sounds normal. No respiratory distress. He has no wheezes. He has no rales.  Musculoskeletal:       Right hip: Normal.       Left hip: Normal.       Cervical back: He exhibits normal range of motion and no tenderness.       Thoracic back: He exhibits normal range of motion and no tenderness.       Lumbar back: Normal. He exhibits normal range of motion and no tenderness.  Vitals reviewed.  Assessment/Plan: 1. Left hip pain Subacute - Chronic. Exam within normal limits. Characteristics of symptoms seem to fit osteoarthritis. Will obtain imaging. OTC medications reviewed with patient. Will begin PT.  - DG HIP UNILAT W OR W/O PELVIS MIN 4 VIEWS LEFT; Future - Ambulatory referral to Physical Therapy   Leeanne Rio, PA-C

## 2015-10-21 NOTE — Patient Instructions (Addendum)
Please go to [facility] for x-ray. The ladies up front will give you directions.  I will call with results. Start an over-the-counter Tylenol Arthritis once to twice daily as needed for pain.  You will be contacted for assessment by Physical therapy.

## 2015-10-21 NOTE — Progress Notes (Signed)
Pre visit review using our clinic review tool, if applicable. No additional management support is needed unless otherwise documented below in the visit note/SLS  

## 2015-10-23 ENCOUNTER — Other Ambulatory Visit: Payer: Self-pay | Admitting: Family Medicine

## 2015-10-23 DIAGNOSIS — R748 Abnormal levels of other serum enzymes: Secondary | ICD-10-CM

## 2015-10-24 ENCOUNTER — Other Ambulatory Visit: Payer: Self-pay | Admitting: Physician Assistant

## 2015-10-24 MED ORDER — TRAMADOL HCL 50 MG PO TABS: 50.0000 mg | ORAL_TABLET | Freq: Two times a day (BID) | ORAL | 0 refills | Status: DC | PRN

## 2015-10-24 NOTE — Progress Notes (Signed)
Faxed hardcopy for Tramadol to [facility]. In [place] Patient is aware

## 2015-10-27 ENCOUNTER — Telehealth: Payer: Self-pay | Admitting: Family Medicine

## 2015-10-27 DIAGNOSIS — M25551 Pain in right hip: Secondary | ICD-10-CM

## 2015-10-27 NOTE — Telephone Encounter (Signed)
I would recommend Physical therapy or an Orthopedist for further assessment.  Pain is secondary to arthritis. Discussed treatment options -- he noted OTC medications were insufficient.  I sent in one-time script for Tramadol and discussed if pain was continued and chronic medication needed to be discussed, he would have to discuss with PCP.

## 2015-10-27 NOTE — Telephone Encounter (Signed)
This patient stated he just did have a hip xray Raiford Noble PA-C). See 10/21/15 His pain starts in the morning and worsens as the day goes making it difficult to walk.

## 2015-10-27 NOTE — Telephone Encounter (Signed)
Please order him an xray of his right hip 2 view with pelvis for hip pain. It is unlikely to be cancer but not inconceiveable so we should xray to rule out. We will review that then make a plan. Need to know if pain is worsening, radiating, no falls?

## 2015-10-27 NOTE — Telephone Encounter (Signed)
Patient has been informed of provider instructions. He is seeing a PT tomorrow already/tramadol has not helped. He has an appt. With PCP the first part of November. But wanted to speak directly to her on the phone, but informed him she was unable to speak directly to patients on the phone due to schedule. The patient is concerned that the hip pan may be cancer since he does have a family history. He would like to know PCP's thoughts on this as he is concerned.  wondererd if xray/blood test would show cancer?

## 2015-10-27 NOTE — Telephone Encounter (Signed)
Thanks I just missed it. So it shows arthritis and joint degeneration as well as osteoporosis I would orer him a bone density test off of that to further investigate. He would need an appt to order an MRI to get insurance to pay for it. The xray shos nothing suggesting cancer.

## 2015-10-27 NOTE — Telephone Encounter (Signed)
Patient is in pain due to his hip and would like to know if there is anything he can do for it. Please advise.  Patient Phone: [phone] Patient Relation: Self

## 2015-10-28 ENCOUNTER — Other Ambulatory Visit: Payer: Self-pay | Admitting: Family Medicine

## 2015-10-28 ENCOUNTER — Ambulatory Visit: Payer: Medicare Other | Attending: Physician Assistant | Admitting: Physical Therapy

## 2015-10-28 DIAGNOSIS — M6281 Muscle weakness (generalized): Secondary | ICD-10-CM | POA: Insufficient documentation

## 2015-10-28 DIAGNOSIS — M25552 Pain in left hip: Secondary | ICD-10-CM | POA: Diagnosis not present

## 2015-10-28 DIAGNOSIS — R262 Difficulty in walking, not elsewhere classified: Secondary | ICD-10-CM

## 2015-10-28 DIAGNOSIS — M810 Age-related osteoporosis without current pathological fracture: Secondary | ICD-10-CM

## 2015-10-28 DIAGNOSIS — R2681 Unsteadiness on feet: Secondary | ICD-10-CM | POA: Diagnosis not present

## 2015-10-28 DIAGNOSIS — M25562 Pain in left knee: Secondary | ICD-10-CM | POA: Diagnosis not present

## 2015-10-28 NOTE — Telephone Encounter (Signed)
I have ordered bone density. Please let him know not clear if we schedule this or he calls them, please clarify

## 2015-10-28 NOTE — Telephone Encounter (Signed)
Patient informed of PCP instructions.  Please go ahead and order him bone density for now.

## 2015-10-28 NOTE — Therapy (Signed)
[facility] [address]  [place], [place], [address] Phone: [phone]   Fax:  [phone]  Physical Therapy Evaluation  Patient Details  Name: Fernando Liu MRN: [id number] Date of Birth: [birth year] Referring Provider: Brunetta Jeans, PA-C  Encounter Date: 10/28/2015      PT End of Session - 10/28/15 1022    Visit Number 1   Number of Visits 16   Date for PT Re-Evaluation 12/26/15   Authorization Type Medicare/AARP   PT Start Time 0934   PT Stop Time 1019   PT Time Calculation (min) 45 min   Activity Tolerance Patient tolerated treatment well   Behavior During Therapy [facility] for tasks assessed/performed      Past Medical History:  Diagnosis Date  . CAD (coronary artery disease)   . Chicken pox 82 yrs old  . Gout 04/24/2015  . High cholesterol   . Hypertension   . Measles as a child  . Medicare annual wellness visit, subsequent 10/31/2012   Living will wife and daughter have a copy, does not want heroic measures Sees cardiology his recent cardiologist is retiring his new doctor will be at [employer]   . Preventative health care 10/31/2012  . Thyroid disease     Past Surgical History:  Procedure Laterality Date  . CATARACT EXTRACTION, BILATERAL     August & November 2012  . HERNIA REPAIR    . PELVIC ABCESS DRAINAGE     states surgery affected control of BM  . TONSILLECTOMY      There were no vitals filed for this visit.       Subjective Assessment - 10/28/15 0938    Subjective Pt reports for the past couple of years pt reports limited ability to walk due tightness in back and chest. For the past 2-3 weeks, pt has begun to have L hip pain and now has L knee pain which has become even more limiting than the provious issues. Pain typically mildest in the morning and progresses as the day goes on.   Pertinent History Fall on ice in January of this year with closed fracture of right tibial plateau     Limitations Sitting;Standing;Walking   How long can you sit comfortably? unlimited as long as changing position   How long can you stand comfortably? <5 minutes   How long can you walk comfortably? <100 ft   Diagnostic tests 10/22/15 L hip x-ray: No fracture or dislocation. Mild symmetric narrowing both hip joints. Underlying osteoporosis. Arterial vascular calcifications/atherosclerosis noted.   Patient Stated Goals "get rid of the pain"   Currently in Pain? Yes   Pain Score 3   Least 0/10, Avg 5/10, Worst 8-9/10   Pain Location Knee   Pain Orientation Left;Anterior   Pain Descriptors / Indicators Aching;Throbbing   Pain Type Acute pain   Pain Radiating Towards hamstring when severe   Pain Onset 1 to 4 weeks ago   Pain Frequency Intermittent   Aggravating Factors  Prolonged sitting, standing or walking   Pain Relieving Factors rest, changing position of knee   Effect of Pain on Daily Activities has to move slower due to pain   Multiple Pain Sites Yes   Pain Score 0  Least 0/10, Avg 4-5/10, Worst 7/10   Pain Location Hip   Pain Orientation Left;Lateral   Pain Descriptors / Indicators Aching;Sore   Pain Type Acute pain   Pain Onset 1 to 4 weeks ago   Pain  Frequency Intermittent   Aggravating Factors  Prolonged sitting, standing or walking   Pain Relieving Factors rest, pressure against outside of hip   Effect of Pain on Daily Activities has to move slower due to pain            [facility] PT Assessment - 10/28/15 0934      Assessment   Medical Diagnosis L hip & knee pain   Referring Provider Brunetta Jeans, PA-C   Onset Date/Surgical Date --  2-3 weeks   Next MD Visit 12/01/15 with Dr. Charlett Blake   Prior Therapy [facility] PT after R LE fracture in January 2017     Precautions   Precautions Fall     Balance Screen   Has the patient fallen in the past 6 months No   Has the patient had a decrease in activity level because of a fear of falling?  No   Is the patient reluctant to leave  their home because of a fear of falling?  No     Home Environment   Living Environment Private residence   Living Arrangements Spouse/significant other   Type of [place] Access Level entry   [place] - single point;Walker - standard     Prior Function   Level of Independence Independent   Vocation Retired;Works at home   [employer]   Leisure reading, mostly sendentary; was swimming laps in apt pool over the summer     Observation/Other Assessments   Focus on Therapeutic Outcomes (FOTO)  Hip 40% (60% limitation); predicted 54% (46% limitation)     ROM / Strength   AROM / PROM / Strength AROM;PROM;Strength     AROM   Overall AROM Comments B hip ROM WFL    AROM Assessment Site Hip;Knee   Right/Left Hip Left;Right   Right/Left Knee Left;Right   Right Knee Extension 7   Right Knee Flexion 139   Left Knee Extension 3   Left Knee Flexion 140     PROM   PROM Assessment Site Knee   Right/Left Knee Left;Right   Right Knee Extension 0   Left Knee Extension 0     Strength   Strength Assessment Site Hip;Knee   Right/Left Hip Left;Right   Right Hip Flexion 4/5   Right Hip Extension 3+/5   Right Hip ABduction 4/5   Right Hip ADduction 4-/5   Left Hip Flexion 3+/5   Left Hip Extension 3+/5   Left Hip ABduction 4-/5   Left Hip ADduction 3+/5   Right/Left Knee Left;Right   Right Knee Flexion 4/5   Right Knee Extension 5/5   Left Knee Flexion 4/5   Left Knee Extension 4+/5     Flexibility   Soft Tissue Assessment /Muscle Length yes   Hamstrings mod tight B   Quadriceps extremely tight in B hip flexors   ITB mod tight B   Piriformis mild to mod tight B     Palpation   Palpation comment ttp over L greater trochanter                 PT Short Term Goals - 10/28/15 1022      PT SHORT TERM GOAL #1   Title Independent with initial HEP by 11/21/15   Status New     PT SHORT TERM GOAL #2   Title Complete  formal balance assessment as indicated by 11/21/15  PT Long Term Goals - 10/28/15 1022      PT LONG TERM GOAL #1   Title Independent with advanced HEP as indicated by 12/26/15   Status New     PT LONG TERM GOAL #2   Title B hip and knee strength >/= 4/5 for improved function by 12/26/15   Status New     PT LONG TERM GOAL #3   Title Pt will report ability to walk for >/= 500 ft w/o increased L hip or knee pain by 12/26/15   Status New               Plan - 10/28/15 1015    Clinical Impression Statement Fernando Liu is an 82 y/o male who presents to OP PT with 2-3 weeks h/o worsening L hip and knee pain. Pt states pain began in his hip but has since also started hurting in his knee, with the knee pain now worse than the hip. No known injury or trauma to L LE, but does report a fall on the ice in January of this year at which times he reports he suffered a fracture to the R LE but unable to localize where the fracture occurred ([place] records indicate closed R tibial plateau fracture). L knee pain 3/10 and L hip pain 0/10 at time of eval, with pain typically mildest in the morning (as low as 0/10 for both hip and knee), but progresses as the day goes on with average knee pain 5/10 and hip 4-5/10 and worst knee pain 8-9/10 and hip 7/10. Pain aggravated by prolonged sitting, standing or walking. Ttp noted over L greater trochanter but no ttp reported at knee. Assessment reveals limited AROM of B hip WFL and L knee at 3-140 vs 7-139 in R knee against gravity, but full B knee extension to 0 degrees available with LE's supported. Moderate to severe tightness noted t/o proximal B LE musculature, L > R, especially in hip flexors, hamstrings and ITB, and to a lesser degree in B piriformis. MMT reveals up to moderate weakness in B hip and knee, L > R with weakness greatest proximally (refer to above MMT). Pain limits pt functionally, causing him to move more slowly and creating increased  difficulty with walking. POC will focus on improving proximal LE soft tissue pliability, core/LE strengthening, gait training and balance/stability training to decrease fall risk as indicated, with manual therapy and modalities PRN for pain.    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, h/o limited ambulation tolerance due chest & back tightness   PT Frequency 2x / week   PT Duration 8 weeks   PT Treatment/Interventions Patient/family education;Therapeutic exercise;Manual techniques;Passive range of motion;Dry needling;Taping;Neuromuscular re-education;Balance training;Therapeutic activities;Functional mobility training;Gait training;Electrical Stimulation;Cryotherapy;Moist Heat;Iontophoresis 4mg /ml Dexamethasone;ADLs/Self Care Home Management   PT Next Visit Plan Create initial HEP for stretching and basic hip/knee strengthening; Possible ionto patch to L greater trochanter if approved by MD   Consulted and Agree with Plan of Care Patient      Patient will benefit from skilled therapeutic intervention in order to improve the following deficits and impairments:  Pain, Decreased strength, Decreased range of motion, Impaired flexibility, Decreased mobility, Decreased activity tolerance, Difficulty walking  Visit Diagnosis: Acute pain of left knee  Pain in left hip  Muscle weakness (generalized)  Difficulty in walking, not elsewhere classified  G-Codes - 10/28/15 1019    Functional Assessment Tool Used Hip FOTO = 40% (60% limitation)   Functional Limitation Mobility: Walking and moving around   Mobility: Walking and Moving Around Current Status [phone]) At least 60 percent but less than 80 percent impaired, limited or restricted   Mobility: Walking and Moving Around Goal Status [phone]) At least 40 percent but less than 60 percent impaired, limited or  restricted       Problem List Patient Active Problem List   Diagnosis Date Noted  . Gout 04/24/2015  . Medicare annual wellness visit, subsequent 10/31/2012  . Hyperlipidemia 12/06/2011  . Perirectal skin irritation 12/06/2011  . CAD (coronary artery disease) 04/03/2011  . Carotid stenosis 04/03/2011  . HTN (hypertension) 04/03/2011  . Hypothyroidism 04/03/2011  . Hyperglycemia 04/03/2011    Percival Spanish, PT, MPT 10/28/2015, 6:25 PM  [facility] [address]  [place], [place], [address] Phone: [phone]   Fax:  [phone]  Name: Fernando Liu MRN: [id number] Date of Birth: 1926-05-29

## 2015-10-28 NOTE — Telephone Encounter (Signed)
Patient informed bone density ordered and they will call and give him the appt.

## 2015-10-30 ENCOUNTER — Ambulatory Visit: Payer: Medicare Other | Admitting: Physical Therapy

## 2015-10-30 DIAGNOSIS — R262 Difficulty in walking, not elsewhere classified: Secondary | ICD-10-CM

## 2015-10-30 DIAGNOSIS — M25562 Pain in left knee: Secondary | ICD-10-CM

## 2015-10-30 DIAGNOSIS — M25552 Pain in left hip: Secondary | ICD-10-CM

## 2015-10-30 DIAGNOSIS — R2681 Unsteadiness on feet: Secondary | ICD-10-CM | POA: Diagnosis not present

## 2015-10-30 DIAGNOSIS — M6281 Muscle weakness (generalized): Secondary | ICD-10-CM

## 2015-10-30 NOTE — Therapy (Signed)
[facility] [address]  [place], [place], [address] Phone: [phone]   Fax:  [phone]  Physical Therapy Treatment  Patient Details  Name: Fernando Liu MRN: [id number] Date of Birth: 01-Dec-1926 Referring Provider: Brunetta Jeans, PA-C  Encounter Date: 10/30/2015      PT End of Session - 10/30/15 0933    Visit Number 2   Number of Visits 16   Date for PT Re-Evaluation 12/26/15   Authorization Type Medicare/AARP   PT Start Time 0933   PT Stop Time 1020   PT Time Calculation (min) 47 min   Activity Tolerance Patient tolerated treatment well   Behavior During Therapy [facility] for tasks assessed/performed      Past Medical History:  Diagnosis Date  . CAD (coronary artery disease)   . Chicken pox 82 yrs old  . Gout 04/24/2015  . High cholesterol   . Hypertension   . Measles as a child  . Medicare annual wellness visit, subsequent 10/31/2012   Living will wife and daughter have a copy, does not want heroic measures Sees cardiology his recent cardiologist is retiring his new doctor will be at [employer]   . Preventative health care 10/31/2012  . Thyroid disease     Past Surgical History:  Procedure Laterality Date  . CATARACT EXTRACTION, BILATERAL     August & November 2012  . HERNIA REPAIR    . PELVIC ABCESS DRAINAGE     states surgery affected control of BM  . TONSILLECTOMY      There were no vitals filed for this visit.      Subjective Assessment - 10/30/15 0938    Subjective Pt reports knee pain remains worst with static standing, and pain continues to progress throughout the day.   Currently in Pain? Yes   Pain Score --  6-7/10   Pain Location Knee   Pain Orientation Left   Pain Score 2   Pain Location Hip   Pain Orientation Left          Today's Treatment  TherEx NuStep - lvl 3 x 5' B Hamstring stretch 2x30" B ITB stretch with strap 2x30' B SKTC stretch 2x30" B KTOS piriformis  stretch 2x30" B Mod thomas hip flexor stretch 2x30" Hooklying Alt Hip ABD/ER - attempted w/ & w/o red TB but deferred d/t pt c/o L hip pain Bridge x5 - deferred after 5 reps d/t increased L hip pain Hooklying Hip ADD ball squeeze 10x5" Seated Alt Hip ABD/ER with red TB 10x3" Seated Alt Hip March with red TB 10x3"           PT Education - 10/30/15 1016    Education provided Yes   Education Details Initial HEP with red TB provided for home use   Person(s) Educated Patient   Methods Explanation;Demonstration;Handout;Verbal cues;Tactile cues   Comprehension Verbalized understanding;Returned demonstration;Verbal cues required;Tactile cues required;Need further instruction          PT Short Term Goals - 10/30/15 0940      PT SHORT TERM GOAL #1   Title Independent with initial HEP by 11/21/15   Status On-going     PT SHORT TERM GOAL #2   Title Complete formal balance assessment as indicated by 11/21/15   Status On-going           PT Long Term Goals - 10/30/15 0941      PT LONG TERM GOAL #1   Title Independent with advanced HEP  as indicated by 12/26/15   Status On-going     PT LONG TERM GOAL #2   Title B hip and knee strength >/= 4/5 for improved function by 12/26/15   Status On-going     PT LONG TERM GOAL #3   Title Pt will report ability to walk for >/= 500 ft w/o increased L hip or knee pain by 12/26/15   Status On-going               Plan - 10/30/15 0941    Clinical Impression Statement Pt reporting pain worst with static standing, esp at knee, but also worsening as day progresses. Created initial HEP for proximal LE stretching and basic hip/core strengthening. Pt reporting increased L hip pain with some hooklying exercises, but able to complete similar exercises in sitting w/o increased pain. Pt instructed to defer any HEP exercise if it increases his pain and will plan to formally review HEP at next visit.   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, h/o limited ambulation tolerance due chest & back tightness   PT Treatment/Interventions Patient/family education;Therapeutic exercise;Manual techniques;Passive range of motion;Dry needling;Taping;Neuromuscular re-education;Balance training;Therapeutic activities;Functional mobility training;Gait training;Electrical Stimulation;Cryotherapy;Moist Heat;Iontophoresis 4mg /ml Dexamethasone;ADLs/Self Care Home Management   PT Next Visit Plan Review initial HEP for stretching and basic hip/knee strengthening; Possible ionto patch to L greater trochanter if approved by MD   Consulted and Agree with Plan of Care Patient      Patient will benefit from skilled therapeutic intervention in order to improve the following deficits and impairments:  Pain, Decreased strength, Decreased range of motion, Impaired flexibility, Decreased mobility, Decreased activity tolerance, Difficulty walking  Visit Diagnosis: Acute pain of left knee  Pain in left hip  Muscle weakness (generalized)  Difficulty in walking, not elsewhere classified     Problem List Patient Active Problem List   Diagnosis Date Noted  . Gout 04/24/2015  . Medicare annual wellness visit, subsequent 10/31/2012  . Hyperlipidemia 12/06/2011  . Perirectal skin irritation 12/06/2011  . CAD (coronary artery disease) 04/03/2011  . Carotid stenosis 04/03/2011  . HTN (hypertension) 04/03/2011  . Hypothyroidism 04/03/2011  . Hyperglycemia 04/03/2011    Percival Spanish, PT, MPT 10/30/2015, 10:35 AM  [facility] [address]  [place], [place], [address] Phone: [phone]   Fax:  [phone]  Name: Fernando Liu MRN: [id number] Date of Birth: [birth year]

## 2015-10-31 ENCOUNTER — Ambulatory Visit (HOSPITAL_BASED_OUTPATIENT_CLINIC_OR_DEPARTMENT_OTHER)
Admission: RE | Admit: 2015-10-31 | Discharge: 2015-10-31 | Disposition: A | Discharge status: Home / Self Care | Payer: Medicare Other | Source: Ambulatory Visit | Attending: Family Medicine | Admitting: Family Medicine

## 2015-10-31 ENCOUNTER — Other Ambulatory Visit: Payer: Self-pay | Admitting: Family Medicine

## 2015-10-31 DIAGNOSIS — M810 Age-related osteoporosis without current pathological fracture: Secondary | ICD-10-CM | POA: Diagnosis not present

## 2015-10-31 MED ORDER — ALENDRONATE SODIUM 70 MG PO TABS: 70.0000 mg | ORAL_TABLET | ORAL | 4 refills | Status: DC

## 2015-11-04 ENCOUNTER — Ambulatory Visit: Payer: Medicare Other | Admitting: Physical Therapy

## 2015-11-04 DIAGNOSIS — M25552 Pain in left hip: Secondary | ICD-10-CM

## 2015-11-04 DIAGNOSIS — R262 Difficulty in walking, not elsewhere classified: Secondary | ICD-10-CM

## 2015-11-04 DIAGNOSIS — M6281 Muscle weakness (generalized): Secondary | ICD-10-CM | POA: Diagnosis not present

## 2015-11-04 DIAGNOSIS — M25562 Pain in left knee: Secondary | ICD-10-CM | POA: Diagnosis not present

## 2015-11-04 DIAGNOSIS — R2681 Unsteadiness on feet: Secondary | ICD-10-CM | POA: Diagnosis not present

## 2015-11-04 NOTE — Therapy (Signed)
[facility] [address]  [place], [place], [address] Phone: [phone]   Fax:  [phone]  Physical Therapy Treatment  Patient Details  Name: Fernando Liu MRN: [id number] Date of Birth: [birth year] Referring Provider: Brunetta Jeans, PA-C  Encounter Date: 11/04/2015      PT End of Session - 11/04/15 0901    Visit Number 3   Number of Visits 16   Date for PT Re-Evaluation 12/26/15   Authorization Type Medicare/AARP   PT Start Time 0901   PT Stop Time 0941   PT Time Calculation (min) 40 min   Activity Tolerance Patient tolerated treatment well   Behavior During Therapy [facility] for tasks assessed/performed      Past Medical History:  Diagnosis Date  . CAD (coronary artery disease)   . Chicken pox 82 yrs old  . Gout 04/24/2015  . High cholesterol   . Hypertension   . Measles as a child  . Medicare annual wellness visit, subsequent 10/31/2012   Living will wife and daughter have a copy, does not want heroic measures Sees cardiology his recent cardiologist is retiring his new doctor will be at [employer]   . Preventative health care 10/31/2012  . Thyroid disease     Past Surgical History:  Procedure Laterality Date  . CATARACT EXTRACTION, BILATERAL     August & November 2012  . HERNIA REPAIR    . PELVIC ABCESS DRAINAGE     states surgery affected control of BM  . TONSILLECTOMY      There were no vitals filed for this visit.      Subjective Assessment - 11/04/15 0905    Subjective Pt reports he has been completing the HEP 1x/day at present and notes some increased muscle soreness in the hip adductors which he is not sure if this is a result of the HEP exercises. Also notes his bone density testing last week revealed osteoporosis.   Patient Stated Goals "get rid of the pain"   Currently in Pain? Yes   Pain Score --  3-4/10 while exercising or with static standing, no pain at rest   Pain Location Knee   Pain Orientation Left           Today's Treatment  TherEx NuStep - lvl 4 x 5' B Hamstring stretch x30" B ITB stretch with strap x30' B SKTC stretch x30" B KTOS piriformis stretch x30" B Mod thomas hip flexor stretch x30" Hooklying Hip ADD ball squeeze 10x5" Seated Alt Hip ABD/ER with red TB 10x3" Seated Alt Hip March with red TB 10x3" Seated Fitter Leg Press (2 blue) x10 Heel Raises x10 Counter partial squat x10 Standing at counter:    Alt Hip ABD & Extension SLR x10 each    Alt Hip Flexion march x10 BATCA Knee Flexion 15# x10 L TKE with green TB x10            PT Short Term Goals - 11/04/15 0946      PT SHORT TERM GOAL #1   Title Independent with initial HEP by 11/21/15   Status Achieved     PT SHORT TERM GOAL #2   Title Complete formal balance assessment as indicated by 11/21/15   Status On-going           PT Long Term Goals - 10/30/15 0941      PT LONG TERM GOAL #1   Title Independent with advanced HEP as indicated by 12/26/15  Status On-going     PT LONG TERM GOAL #2   Title B hip and knee strength >/= 4/5 for improved function by 12/26/15   Status On-going     PT LONG TERM GOAL #3   Title Pt will report ability to walk for >/= 500 ft w/o increased L hip or knee pain by 12/26/15   Status On-going               Plan - 11/04/15 0909    Clinical Impression Statement Pt demonstrating good recall of HEP exercises, esp good awareness of pacing and hold times, with only minor corrections necessary. Able to tolerate exercise progress exercises with some c/o muscle fatigue but no increased pain.   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, h/o limited ambulation tolerance due chest & back tightness   PT Treatment/Interventions Patient/family education;Therapeutic exercise;Manual techniques;Passive range of motion;Dry  needling;Taping;Neuromuscular re-education;Balance training;Therapeutic activities;Functional mobility training;Gait training;Electrical Stimulation;Cryotherapy;Moist Heat;Iontophoresis 4mg /ml Dexamethasone;ADLs/Self Care Home Management   PT Next Visit Plan Hip/knee strengthening; Possible ionto patch to L greater trochanter as indicated by pain if approved by MD   Consulted and Agree with Plan of Care Patient      Patient will benefit from skilled therapeutic intervention in order to improve the following deficits and impairments:  Pain, Decreased strength, Decreased range of motion, Impaired flexibility, Decreased mobility, Decreased activity tolerance, Difficulty walking  Visit Diagnosis: Acute pain of left knee  Pain in left hip  Muscle weakness (generalized)  Difficulty in walking, not elsewhere classified     Problem List Patient Active Problem List   Diagnosis Date Noted  . Gout 04/24/2015  . Medicare annual wellness visit, subsequent 10/31/2012  . Hyperlipidemia 12/06/2011  . Perirectal skin irritation 12/06/2011  . CAD (coronary artery disease) 04/03/2011  . Carotid stenosis 04/03/2011  . HTN (hypertension) 04/03/2011  . Hypothyroidism 04/03/2011  . Hyperglycemia 04/03/2011    Percival Spanish, PT, MPT 11/04/2015, 9:47 AM  [facility] [address]  [place], [place], [address] Phone: [phone]   Fax:  [phone]  Name: Fernando Liu MRN: [id number] Date of Birth: 01-Sep-1926

## 2015-11-06 ENCOUNTER — Ambulatory Visit: Payer: Medicare Other

## 2015-11-06 DIAGNOSIS — M25552 Pain in left hip: Secondary | ICD-10-CM | POA: Diagnosis not present

## 2015-11-06 DIAGNOSIS — R262 Difficulty in walking, not elsewhere classified: Secondary | ICD-10-CM

## 2015-11-06 DIAGNOSIS — M25562 Pain in left knee: Secondary | ICD-10-CM | POA: Diagnosis not present

## 2015-11-06 DIAGNOSIS — M6281 Muscle weakness (generalized): Secondary | ICD-10-CM | POA: Diagnosis not present

## 2015-11-06 DIAGNOSIS — R2681 Unsteadiness on feet: Secondary | ICD-10-CM | POA: Diagnosis not present

## 2015-11-06 NOTE — Therapy (Signed)
[facility] [address]  [place], [place], [address] Phone: [phone]   Fax:  [phone]  Physical Therapy Treatment  Patient Details  Name: Fernando Liu MRN: [id number] Date of Birth: [birth year] Referring Provider: Brunetta Jeans, PA-C  Encounter Date: 11/06/2015      PT End of Session - 11/06/15 0944    Visit Number 4   Number of Visits 16   Date for PT Re-Evaluation 12/26/15   Authorization Type Medicare/AARP   PT Start Time 0939   PT Stop Time 1017   PT Time Calculation (min) 38 min   Activity Tolerance Patient tolerated treatment well   Behavior During Therapy [facility] for tasks assessed/performed      Past Medical History:  Diagnosis Date  . CAD (coronary artery disease)   . Chicken pox 82 yrs old  . Gout 04/24/2015  . High cholesterol   . Hypertension   . Measles as a child  . Medicare annual wellness visit, subsequent 10/31/2012   Living will wife and daughter have a copy, does not want heroic measures Sees cardiology his recent cardiologist is retiring his new doctor will be at [employer]   . Preventative health care 10/31/2012  . Thyroid disease     Past Surgical History:  Procedure Laterality Date  . CATARACT EXTRACTION, BILATERAL     August & November 2012  . HERNIA REPAIR    . PELVIC ABCESS DRAINAGE     states surgery affected control of BM  . TONSILLECTOMY      There were no vitals filed for this visit.      Subjective Assessment - 11/06/15 1012    Subjective Pt. reporting he wasn't sore following last treatment.     Patient Stated Goals "get rid of the pain"   Currently in Pain? No/denies   Pain Score 0-No pain   Multiple Pain Sites No      Today's Treatment:  TherEx: Hooklying Hip ADD ball squeeze 10 x 5" Hooklying alternating hip abd/ER with green TB x 15 reps Hooklying alternating brace march with green TB x 10 reps each  Standing L knee extension ball squeeze against  wall 5" x 10 reps Seated Fitter L Leg Press (2 blue) x 12 reps  Standing at counter:      Alt Hip ABD & Extension SLR 1# x 12 each      Alt Hip Flexion march 1# x 12 reps Counter partial squat x 12 reps   Ionto Patch (# 1 of 6) to L greater trochanteric area - Dexamethasone 1 mL, 80 mA-min, 4-6 hr wear time          PT Short Term Goals - 11/04/15 0946      PT SHORT TERM GOAL #1   Title Independent with initial HEP by 11/21/15   Status Achieved     PT SHORT TERM GOAL #2   Title Complete formal balance assessment as indicated by 11/21/15   Status On-going           PT Long Term Goals - 10/30/15 0941      PT LONG TERM GOAL #1   Title Independent with advanced HEP as indicated by 12/26/15   Status On-going     PT LONG TERM GOAL #2   Title B hip and knee strength >/= 4/5 for improved function by 12/26/15   Status On-going     PT LONG TERM GOAL #3   Title Pt will  report ability to walk for >/= 500 ft w/o increased L hip or knee pain by 12/26/15   Status On-going               Plan - 11/06/15 1014    Clinical Impression Statement  Pt. reporting he felt good following new exercises last treatment.  Pt. tolerated mild progression in hip/knee strengthening activities well today however did report some L hip pain following standing activities.  Ionto patch 1#/6 applied to L greater trochanteric area today.  Will plan to monitor response to ionto patch next treatment and progress hip/knee strengthening as tolerated.      PT Treatment/Interventions Patient/family education;Therapeutic exercise;Manual techniques;Passive range of motion;Dry needling;Taping;Neuromuscular re-education;Balance training;Therapeutic activities;Functional mobility training;Gait training;Electrical Stimulation;Cryotherapy;Moist Heat;Iontophoresis 4mg /ml Dexamethasone;ADLs/Self Care Home Management   PT Next Visit Plan Hip/knee strengthening; Monitor response to ionto patch #1 to L greater trochanter       Patient will benefit from skilled therapeutic intervention in order to improve the following deficits and impairments:  Pain, Decreased strength, Decreased range of motion, Impaired flexibility, Decreased mobility, Decreased activity tolerance, Difficulty walking  Visit Diagnosis: Acute pain of left knee  Pain in left hip  Muscle weakness (generalized)  Difficulty in walking, not elsewhere classified     Problem List Patient Active Problem List   Diagnosis Date Noted  . Gout 04/24/2015  . Medicare annual wellness visit, subsequent 10/31/2012  . Hyperlipidemia 12/06/2011  . Perirectal skin irritation 12/06/2011  . CAD (coronary artery disease) 04/03/2011  . Carotid stenosis 04/03/2011  . HTN (hypertension) 04/03/2011  . Hypothyroidism 04/03/2011  . Hyperglycemia 04/03/2011    Bess Harvest, PTA 11/06/2015, 12:27 PM  [facility] [address]  [place], [place], [address] Phone: [phone]   Fax:  [phone]  Name: Fernando Liu MRN: [id number] Date of Birth: [birth year]

## 2015-11-11 ENCOUNTER — Other Ambulatory Visit: Payer: Self-pay | Admitting: Family Medicine

## 2015-11-11 ENCOUNTER — Ambulatory Visit: Payer: Medicare Other | Admitting: Physical Therapy

## 2015-11-11 DIAGNOSIS — M6281 Muscle weakness (generalized): Secondary | ICD-10-CM | POA: Diagnosis not present

## 2015-11-11 DIAGNOSIS — M25562 Pain in left knee: Secondary | ICD-10-CM

## 2015-11-11 DIAGNOSIS — R262 Difficulty in walking, not elsewhere classified: Secondary | ICD-10-CM | POA: Diagnosis not present

## 2015-11-11 DIAGNOSIS — M25552 Pain in left hip: Secondary | ICD-10-CM | POA: Diagnosis not present

## 2015-11-11 DIAGNOSIS — R2681 Unsteadiness on feet: Secondary | ICD-10-CM | POA: Diagnosis not present

## 2015-11-11 NOTE — Therapy (Signed)
[facility] [address]  [place], [place], [address] Phone: [phone]   Fax:  [phone]  Physical Therapy Treatment  Patient Details  Name: Fernando Liu MRN: [id number] Date of Birth: Oct 23, 1926 Referring Provider: Brunetta Jeans, PA-C  Encounter Date: 11/11/2015      PT End of Session - 11/11/15 0934    Visit Number 5   Number of Visits 16   Date for PT Re-Evaluation 12/26/15   Authorization Type Medicare/AARP   PT Start Time 0934   PT Stop Time 1018   PT Time Calculation (min) 44 min   Activity Tolerance Patient tolerated treatment well   Behavior During Therapy [facility] for tasks assessed/performed      Past Medical History:  Diagnosis Date  . CAD (coronary artery disease)   . Chicken pox 82 yrs old  . Gout 04/24/2015  . High cholesterol   . Hypertension   . Measles as a child  . Medicare annual wellness visit, subsequent 10/31/2012   Living will wife and daughter have a copy, does not want heroic measures Sees cardiology his recent cardiologist is retiring his new doctor will be at [employer]   . Preventative health care 10/31/2012  . Thyroid disease     Past Surgical History:  Procedure Laterality Date  . CATARACT EXTRACTION, BILATERAL     August & November 2012  . HERNIA REPAIR    . PELVIC ABCESS DRAINAGE     states surgery affected control of BM  . TONSILLECTOMY      There were no vitals filed for this visit.      Subjective Assessment - 11/11/15 0934    Subjective Pt not sure if any benefit from ionto patch as the hip was not hurting when he left PT last visit. Does continue to note pain in L groin when walking to mailbox.   Patient Stated Goals "get rid of the pain"   Currently in Pain? No/denies          Today's Treatment  TherEx NuStep - lvl 4 x 5' Seated Alt Hip ABD/ER with green TB 15x3" Seated Alt Hip March with green TB 15x3" Hooklying B Hip ADD stretch x30" Bridge  10x3" Bridge + Hip ADD ball squeeze 10x3" Standing at counter:    Alt Hip ABD & Extension SLR with yellow TB x10 each    Alt Hip Flexion march x10    Heel Raises x15    Partial squat x10  (cues to avoid knees passing toes) B TKE with small ball on wall 10x3"           PT Education - 11/11/15 1018    Education provided Yes   Education Details Upgraded theraband to green for HEP & added basic standing exercises w/o added resistance   Person(s) Educated Patient   Methods Explanation;Demonstration;Handout   Comprehension Verbalized understanding;Returned demonstration;Need further instruction          PT Short Term Goals - 11/04/15 0946      PT SHORT TERM GOAL #1   Title Independent with initial HEP by 11/21/15   Status Achieved     PT SHORT TERM GOAL #2   Title Complete formal balance assessment as indicated by 11/21/15   Status On-going           PT Long Term Goals - 10/30/15 0941      PT LONG TERM GOAL #1   Title Independent with advanced HEP as indicated by  12/26/15   Status On-going     PT LONG TERM GOAL #2   Title B hip and knee strength >/= 4/5 for improved function by 12/26/15   Status On-going     PT LONG TERM GOAL #3   Title Pt will report ability to walk for >/= 500 ft w/o increased L hip or knee pain by 12/26/15   Status On-going               Plan - 11/11/15 0942    Clinical Impression Statement Pt unsure of benefit from ionto patch as not experiencing any lateral hip pain at time of application or otherwise recently. Did note some mild L groin pain when walking to mailbox after last therapy session but not sure if it was related to exercises performed during therapy session. Pt desiring to have more exercises to attempt at home therefore provided basic standing exercises w/o resistance but cautioned pt to defer exercises if pain increases during performance of exercises and to alternate days with prior HEP if too fatigued when completing all  exercises daily. Pt continues to note some balance issues with minor LOB noted during session today when walking across gym, therefore will plan to complete formal balance assessment and update goals according to findings within next few visits.   PT Treatment/Interventions Patient/family education;Therapeutic exercise;Manual techniques;Passive range of motion;Dry needling;Taping;Neuromuscular re-education;Balance training;Therapeutic activities;Functional mobility training;Gait training;Electrical Stimulation;Cryotherapy;Moist Heat;Iontophoresis 4mg /ml Dexamethasone;ADLs/Self Care Home Management   PT Next Visit Plan Complete balance assessment and update goals as indicated based of results of testing; Hip/knee strengthening; Modalities as indicated including Ionto patch #2 of 6   Consulted and Agree with Plan of Care Patient      Patient will benefit from skilled therapeutic intervention in order to improve the following deficits and impairments:  Pain, Decreased strength, Decreased range of motion, Impaired flexibility, Decreased mobility, Decreased activity tolerance, Difficulty walking  Visit Diagnosis: Acute pain of left knee  Pain in left hip  Muscle weakness (generalized)  Difficulty in walking, not elsewhere classified     Problem List Patient Active Problem List   Diagnosis Date Noted  . Gout 04/24/2015  . Medicare annual wellness visit, subsequent 10/31/2012  . Hyperlipidemia 12/06/2011  . Perirectal skin irritation 12/06/2011  . CAD (coronary artery disease) 04/03/2011  . Carotid stenosis 04/03/2011  . HTN (hypertension) 04/03/2011  . Hypothyroidism 04/03/2011  . Hyperglycemia 04/03/2011    Percival Spanish, PT, MPT 11/11/2015, 10:37 AM  [facility] [address]  [place], [place], [address] Phone: [phone]   Fax:  [phone]  Name: Fernando Liu MRN: [id number] Date of Birth: Sep 11, 1926

## 2015-11-13 ENCOUNTER — Ambulatory Visit: Payer: Medicare Other | Admitting: Physical Therapy

## 2015-11-13 DIAGNOSIS — R2681 Unsteadiness on feet: Secondary | ICD-10-CM

## 2015-11-13 DIAGNOSIS — M6281 Muscle weakness (generalized): Secondary | ICD-10-CM | POA: Diagnosis not present

## 2015-11-13 DIAGNOSIS — M25552 Pain in left hip: Secondary | ICD-10-CM | POA: Diagnosis not present

## 2015-11-13 DIAGNOSIS — R262 Difficulty in walking, not elsewhere classified: Secondary | ICD-10-CM | POA: Diagnosis not present

## 2015-11-13 DIAGNOSIS — M25562 Pain in left knee: Secondary | ICD-10-CM

## 2015-11-13 NOTE — Therapy (Signed)
[facility] [address]  [place], [place], [address] Phone: [phone]   Fax:  [phone]  Physical Therapy Treatment  Patient Details  Name: Fernando Liu MRN: [id number] Date of Birth: 12-28-1926 Referring Provider: Brunetta Jeans, PA-C  Encounter Date: 11/13/2015      PT End of Session - 11/13/15 0932    Visit Number 6   Number of Visits 16   Date for PT Re-Evaluation 12/26/15   Authorization Type Medicare/AARP   PT Start Time 0932   PT Stop Time 1016   PT Time Calculation (min) 44 min   Activity Tolerance Patient tolerated treatment well   Behavior During Therapy [facility] for tasks assessed/performed      Past Medical History:  Diagnosis Date  . CAD (coronary artery disease)   . Chicken pox 82 yrs old  . Gout 04/24/2015  . High cholesterol   . Hypertension   . Measles as a child  . Medicare annual wellness visit, subsequent 10/31/2012   Living will wife and daughter have a copy, does not want heroic measures Sees cardiology his recent cardiologist is retiring his new doctor will be at [employer]   . Preventative health care 10/31/2012  . Thyroid disease     Past Surgical History:  Procedure Laterality Date  . CATARACT EXTRACTION, BILATERAL     August & November 2012  . HERNIA REPAIR    . PELVIC ABCESS DRAINAGE     states surgery affected control of BM  . TONSILLECTOMY      There were no vitals filed for this visit.      Subjective Assessment - 11/13/15 0935    Subjective Pt reports able to complete new HEP without difficulty other than TKE (unable to find open space on wall/door), but did note that exercises made him "work".    Patient Stated Goals "get rid of the pain"   Currently in Pain? No/denies            [facility] PT Assessment - 11/13/15 0932      Standardized Balance Assessment   Standardized Balance Assessment Berg Balance Test;10 meter walk test   10 Meter Walk 2.82 ft/sec   11.62"     Berg Balance Test   Sit to Stand Able to stand  independently using hands   Standing Unsupported Able to stand safely 2 minutes   Sitting with Back Unsupported but Feet Supported on Floor or Stool Able to sit safely and securely 2 minutes   Stand to Sit Controls descent by using hands   Transfers Able to transfer safely, definite need of hands   Standing Unsupported with Eyes Closed Able to stand 10 seconds with supervision   Standing Ubsupported with Feet Together Able to place feet together independently and stand for 1 minute with supervision   From Standing, Reach Forward with Outstretched Arm Can reach forward >12 cm safely (5")   From Standing Position, Pick up Object from Floor Able to pick up shoe, needs supervision   From Standing Position, Turn to Look Behind Over each Shoulder Turn sideways only but maintains balance   Turn 360 Degrees Able to turn 360 degrees safely but slowly   Standing Unsupported, Alternately Place Feet on Step/Stool Able to complete 4 steps without aid or supervision   Standing Unsupported, One Foot in Front Able to take small step independently and hold 30 seconds   Standing on One Leg Tries to lift leg/unable to hold 3  seconds but remains standing independently   Total Score 38   Berg comment: 37-45 significant fall risk (>80%)      Functional Gait  Assessment   Gait assessed  Yes   Gait Level Surface Walks 20 ft, slow speed, abnormal gait pattern, evidence for imbalance or deviates 10-15 in outside of the 12 in walkway width. Requires more than 7 sec to ambulate 20 ft.   Change in Gait Speed Able to change speed, demonstrates mild gait deviations, deviates 6-10 in outside of the 12 in walkway width, or no gait deviations, unable to achieve a major change in velocity, or uses a change in velocity, or uses an assistive device.   Gait with Horizontal Head Turns Performs head turns smoothly with slight change in gait velocity (eg, minor disruption  to smooth gait path), deviates 6-10 in outside 12 in walkway width, or uses an assistive device.   Gait with Vertical Head Turns Performs task with moderate change in gait velocity, slows down, deviates 10-15 in outside 12 in walkway width but recovers, can continue to walk.   Gait and Pivot Turn Pivot turns safely in greater than 3 sec and stops with no loss of balance, or pivot turns safely within 3 sec and stops with mild imbalance, requires small steps to catch balance.   Step Over Obstacle Is able to step over one shoe box (4.5 in total height) but must slow down and adjust steps to clear box safely. May require verbal cueing.   Gait with Narrow Base of Support Ambulates 4-7 steps.   Gait with Eyes Closed Walks 20 ft, slow speed, abnormal gait pattern, evidence for imbalance, deviates 10-15 in outside 12 in walkway width. Requires more than 9 sec to ambulate 20 ft.   Ambulating Backwards Walks 20 ft, slow speed, abnormal gait pattern, evidence for imbalance, deviates 10-15 in outside 12 in walkway width.   Steps Cannot do safely.  knees buckled on ascent   Total Score 12   FGA comment: < 19 = high risk fall       Today's Treatment  TherEx NuStep - lvl 5x 5' Bridge + Hip ADD ball squeeze 10x3" B TKE with small ball on wall 10x3" Standing at counter: Alt Hip ABD &Extension SLR with yellow TB x10 each Alt Hip Flexion march x10    Heel Raises x15    Partial squat x10  (cues to avoid knees passing toes)   PPT Berg Balance Test Functional Gait Assessment           PT Education - 11/13/15 1016    Education provided Yes   Education Details Results of balance assessment and plan to incorporate balance and dynamic gait activities into POC   Person(s) Educated Patient   Methods Explanation   Comprehension Verbalized understanding          PT Short Term Goals - 11/13/15 1015      PT SHORT TERM GOAL #1   Title Independent with initial HEP by 11/21/15   Status  Achieved     PT SHORT TERM GOAL #2   Title Complete formal balance assessment as indicated by 11/21/15   Status Achieved           PT Long Term Goals - 11/13/15 1015      PT LONG TERM GOAL #1   Title Independent with advanced HEP as indicated by 12/26/15   Status On-going     PT LONG TERM GOAL #2   Title B  hip and knee strength >/= 4/5 for improved function by 12/26/15   Status On-going     PT LONG TERM GOAL #3   Title Pt will report ability to walk for >/= 500 ft w/o increased L hip or knee pain by 12/26/15   Status On-going     PT LONG TERM GOAL #4   Title Increase Berg to >/= 48/56 to decrease fall risk by 12/26/15   Status New     PT LONG TERM GOAL #5   Title Increase FGA to >/= 20/30 to improve gait safety and stability by 12/26/15   Status New               Plan - 11/13/15 0932    Clinical Impression Statement Pt noting faitgue from addition of standing exercises to HEP, with some discomfort in low back noted with review of standing exercises reqruiring correction of posture and body mechanics. Completed balance assessment via Berg (38/56) and dynamic gait assessment via FGA with both assessments indicating a significant to high fall risk, therefore will incorporate balance and dynmaic gait training into POC.   PT Treatment/Interventions Patient/family education;Therapeutic exercise;Manual techniques;Passive range of motion;Dry needling;Taping;Neuromuscular re-education;Balance training;Therapeutic activities;Functional mobility training;Gait training;Electrical Stimulation;Cryotherapy;Moist Heat;Iontophoresis 4mg /ml Dexamethasone;ADLs/Self Care Home Management   PT Next Visit Plan Hip/knee strengthening; Balance and dynamic gait activities; Modalities as indicated including Ionto patch #2 of 6   Consulted and Agree with Plan of Care Patient      Patient will benefit from skilled therapeutic intervention in order to improve the following deficits and impairments:   Pain, Decreased strength, Decreased range of motion, Impaired flexibility, Decreased mobility, Decreased activity tolerance, Difficulty walking  Visit Diagnosis: Acute pain of left knee  Pain in left hip  Muscle weakness (generalized)  Difficulty in walking, not elsewhere classified  Unsteadiness on feet     Problem List Patient Active Problem List   Diagnosis Date Noted  . Gout 04/24/2015  . Medicare annual wellness visit, subsequent 10/31/2012  . Hyperlipidemia 12/06/2011  . Perirectal skin irritation 12/06/2011  . CAD (coronary artery disease) 04/03/2011  . Carotid stenosis 04/03/2011  . HTN (hypertension) 04/03/2011  . Hypothyroidism 04/03/2011  . Hyperglycemia 04/03/2011    Percival Spanish, PT, MPT 11/13/2015, 12:18 PM  [facility] [address]  [place], [place], [address] Phone: [phone]   Fax:  [phone]  Name: Fernando Liu MRN: [id number] Date of Birth: [birth year]

## 2015-11-18 ENCOUNTER — Ambulatory Visit: Payer: Medicare Other

## 2015-11-18 DIAGNOSIS — M25552 Pain in left hip: Secondary | ICD-10-CM | POA: Diagnosis not present

## 2015-11-18 DIAGNOSIS — M6281 Muscle weakness (generalized): Secondary | ICD-10-CM

## 2015-11-18 DIAGNOSIS — R262 Difficulty in walking, not elsewhere classified: Secondary | ICD-10-CM | POA: Diagnosis not present

## 2015-11-18 DIAGNOSIS — M25562 Pain in left knee: Secondary | ICD-10-CM | POA: Diagnosis not present

## 2015-11-18 DIAGNOSIS — R2681 Unsteadiness on feet: Secondary | ICD-10-CM | POA: Diagnosis not present

## 2015-11-18 NOTE — Therapy (Signed)
[facility] [address]  [place], [place], [address] Phone: [phone]   Fax:  [phone]  Physical Therapy Treatment  Patient Details  Name: Fernando Liu MRN: [id number] Date of Birth: [birth year] Referring Provider: Brunetta Jeans, PA-C  Encounter Date: 11/18/2015      PT End of Session - 11/18/15 0943    Visit Number 7   Number of Visits 16   Date for PT Re-Evaluation 12/26/15   Authorization Type Medicare/AARP   PT Start Time 0938   PT Stop Time 1016   PT Time Calculation (min) 38 min   Activity Tolerance Patient tolerated treatment well   Behavior During Therapy [facility] for tasks assessed/performed      Past Medical History:  Diagnosis Date  . CAD (coronary artery disease)   . Chicken pox 82 yrs old  . Gout 04/24/2015  . High cholesterol   . Hypertension   . Measles as a child  . Medicare annual wellness visit, subsequent 10/31/2012   Living will wife and daughter have a copy, does not want heroic measures Sees cardiology his recent cardiologist is retiring his new doctor will be at [employer]   . Preventative health care 10/31/2012  . Thyroid disease     Past Surgical History:  Procedure Laterality Date  . CATARACT EXTRACTION, BILATERAL     August & November 2012  . HERNIA REPAIR    . PELVIC ABCESS DRAINAGE     states surgery affected control of BM  . TONSILLECTOMY      There were no vitals filed for this visit.      Subjective Assessment - 11/18/15 0942    Subjective Pt. reporting he had soreness on and off over the weekend from last treatment however was able to still perform HEP most days.     Patient Stated Goals "get rid of the pain"   Currently in Pain? No/denies   Pain Score 0-No pain   Multiple Pain Sites No      Today's Treatment  TherEx NuStep - lvl 6x 5' Seated in  Standing at treadmill:    Alt Hip ABD &Extension SLR with yellow TB x 12 each     Alt Hip Flexion  march x 12 reps Seated hip adduction 2 x 10 reps 5"; resting between activities in standing  Standing at treadmill:     Heel Raises x 15 repetitions    Partial squat x 12  Reps; pt. with improved technique knees over toes x 1 cue  Seated alternating high knee march with green TB around knees x 10 reps each side  Seated alternating side step with green TB around knees x 10 reps each side  Neuro re-education: Standing alternating toe-touch to 10" step standing beside treadmill x 10 reps each side; close supervision from therapist; 1 UE occasional touch down on treadmill   Therex: Standing holding onto treadmill toe raises x 20 reps         PT Short Term Goals - 11/13/15 1015      PT SHORT TERM GOAL #1   Title Independent with initial HEP by 11/21/15   Status Achieved     PT SHORT TERM GOAL #2   Title Complete formal balance assessment as indicated by 11/21/15   Status Achieved           PT Long Term Goals - 11/18/15 1316      PT LONG TERM GOAL #1   Title Independent  with advanced HEP as indicated by 12/26/15   Status On-going     PT LONG TERM GOAL #2   Title B hip and knee strength >/= 4/5 for improved function by 12/26/15   Status On-going     PT LONG TERM GOAL #3   Title Pt will report ability to walk for >/= 500 ft w/o increased L hip or knee pain by 12/26/15   Status On-going     PT LONG TERM GOAL #4   Title Increase Berg to >/= 48/56 to decrease fall risk by 12/26/15   Status On-going     PT LONG TERM GOAL #5   Title Increase FGA to >/= 20/30 to improve gait safety and stability by 12/26/15   Status On-going               Plan - 11/18/15 0944    Clinical Impression Statement  Pt. tolerated progression of standing hip strengthening activity today well without report of fatigue.  Pt. reporting L hip pain has been gone over the last few days.  Pt. without pain complaint today and seems to be progressing well able to perform HEP over weekend despite  some soreness following last treatment.   PT Treatment/Interventions Patient/family education;Therapeutic exercise;Manual techniques;Passive range of motion;Dry needling;Taping;Neuromuscular re-education;Balance training;Therapeutic activities;Functional mobility training;Gait training;Electrical Stimulation;Cryotherapy;Moist Heat;Iontophoresis 4mg /ml Dexamethasone;ADLs/Self Care Home Management   PT Next Visit Plan Hip/knee strengthening; Balance and dynamic gait activities; Modalities as indicated including Ionto patch #2 of 6      Patient will benefit from skilled therapeutic intervention in order to improve the following deficits and impairments:  Pain, Decreased strength, Decreased range of motion, Impaired flexibility, Decreased mobility, Decreased activity tolerance, Difficulty walking  Visit Diagnosis: Acute pain of left knee  Pain in left hip  Muscle weakness (generalized)  Difficulty in walking, not elsewhere classified     Problem List Patient Active Problem List   Diagnosis Date Noted  . Gout 04/24/2015  . Medicare annual wellness visit, subsequent 10/31/2012  . Hyperlipidemia 12/06/2011  . Perirectal skin irritation 12/06/2011  . CAD (coronary artery disease) 04/03/2011  . Carotid stenosis 04/03/2011  . HTN (hypertension) 04/03/2011  . Hypothyroidism 04/03/2011  . Hyperglycemia 04/03/2011    Bess Harvest, PTA 11/18/15 1:16 PM  [facility] [address]  [place], [place], [address] Phone: [phone]   Fax:  [phone]  Name: Fernando Liu MRN: [id number] Date of Birth: 03-Jul-1926

## 2015-11-19 ENCOUNTER — Other Ambulatory Visit: Payer: Self-pay | Admitting: Family Medicine

## 2015-11-20 ENCOUNTER — Ambulatory Visit: Payer: Medicare Other | Admitting: Physical Therapy

## 2015-11-20 DIAGNOSIS — R262 Difficulty in walking, not elsewhere classified: Secondary | ICD-10-CM | POA: Diagnosis not present

## 2015-11-20 DIAGNOSIS — M6281 Muscle weakness (generalized): Secondary | ICD-10-CM

## 2015-11-20 DIAGNOSIS — M25562 Pain in left knee: Secondary | ICD-10-CM | POA: Diagnosis not present

## 2015-11-20 DIAGNOSIS — M25552 Pain in left hip: Secondary | ICD-10-CM

## 2015-11-20 DIAGNOSIS — R2681 Unsteadiness on feet: Secondary | ICD-10-CM

## 2015-11-20 NOTE — Therapy (Signed)
[facility] [address]  [place], [place], [address] Phone: [phone]   Fax:  [phone]  Physical Therapy Treatment  Patient Details  Name: Fernando Liu MRN: [id number] Date of Birth: 80-Sep-1928 Referring Provider: Brunetta Jeans, PA-C  Encounter Date: 11/20/2015      PT End of Session - 11/20/15 0932    Visit Number 8   Number of Visits 16   Date for PT Re-Evaluation 12/26/15   Authorization Type Medicare/AARP   PT Start Time 0932   PT Stop Time 1011   PT Time Calculation (min) 39 min   Activity Tolerance Patient tolerated treatment well   Behavior During Therapy [facility] for tasks assessed/performed      Past Medical History:  Diagnosis Date  . CAD (coronary artery disease)   . Chicken pox 82 yrs old  . Gout 04/24/2015  . High cholesterol   . Hypertension   . Measles as a child  . Medicare annual wellness visit, subsequent 10/31/2012   Living will wife and daughter have a copy, does not want heroic measures Sees cardiology his recent cardiologist is retiring his new doctor will be at [employer]   . Preventative health care 10/31/2012  . Thyroid disease     Past Surgical History:  Procedure Laterality Date  . CATARACT EXTRACTION, BILATERAL     August & November 2012  . HERNIA REPAIR    . PELVIC ABCESS DRAINAGE     states surgery affected control of BM  . TONSILLECTOMY      There were no vitals filed for this visit.      Subjective Assessment - 11/20/15 0936    Subjective Pt reports he woke up this morning with some pain in his posterior L hip and buttock which he describes as a "nuisance more than anything". Notes this pain is most common after he has been sitting in his recliner for a longer period of time.   Patient Stated Goals "get rid of the pain"   Currently in Pain? Yes   Pain Score 0-No pain   Pain Score 4   Pain Location Hip  & buttock   Pain Orientation Left;Posterior   Pain  Descriptors / Indicators --  "nuisance"            [facility] PT Assessment - 11/20/15 0932      Assessment   Medical Diagnosis L hip & knee pain   Referring Provider Brunetta Jeans, PA-C   Onset Date/Surgical Date --  2-3 weeks   Next MD Visit 12/01/15 with Dr. Charlett Blake     AROM   AROM Assessment Site Knee   Right Knee Extension 6   Left Knee Extension 5     Strength   Overall Strength Comments Tested in sitting   Right Hip Flexion 4+/5   Right Hip Extension 4-/5   Right Hip ABduction 4+/5   Right Hip ADduction 4/5   Left Hip Flexion 4-/5   Left Hip Extension 4-/5   Left Hip ABduction 4/5   Left Hip ADduction 4-/5   Right Knee Flexion 4/5   Right Knee Extension 5/5   Left Knee Flexion 4/5   Left Knee Extension 5/5           Today's Treatment  TherEx NuStep - lvl 6x 5' BATCA Hamstring curl 20# x10 B con/ecc, 20# B con/Alt SL ecc Standing at counter:    Alt Fwd Lunge x10 each    Heel  Raises x20    Squat x15    B Sidestepping with yellow TB 3 x 31f    B Lat Step-up to aerobic step (no risers) x10            PT Short Term Goals - 11/13/15 1015      PT SHORT TERM GOAL #1   Title Independent with initial HEP by 11/21/15   Status Achieved     PT SHORT TERM GOAL #2   Title Complete formal balance assessment as indicated by 11/21/15   Status Achieved           PT Long Term Goals - 11/20/15 0952      PT LONG TERM GOAL #1   Title Independent with advanced HEP as indicated by 12/26/15   Status On-going     PT LONG TERM GOAL #2   Title B hip and knee strength >/= 4/5 for improved function by 12/26/15   Status Partially Met     PT LONG TERM GOAL #3   Title Pt will report ability to walk for >/= 500 ft w/o increased L hip or knee pain by 12/26/15   Status On-going     PT LONG TERM GOAL #4   Title Increase Berg to >/= 48/56 to decrease fall risk by 12/26/15   Status On-going     PT LONG TERM GOAL #5   Title Increase FGA to >/= 20/30 to  improve gait safety and stability by 12/26/15   Status On-going               Plan - 11/20/15 0953    Clinical Impression Statement Strength improving overall but pt remains slightly weaker in L hip vs R and in hamstrings vs quads. He continues to note standing HEP is challenging but feels like he is tolerating other exercises well. Given recently identified balance deficits indicating higher risk for falls, will introduce [place] Fall Prevention program at next visit in addition to continued strengthening targeting areas of continued weakness.   PT Treatment/Interventions Patient/family education;Therapeutic exercise;Manual techniques;Passive range of motion;Dry needling;Taping;Neuromuscular re-education;Balance training;Therapeutic activities;Functional mobility training;Gait training;Electrical Stimulation;Cryotherapy;Moist Heat;Iontophoresis 429mml Dexamethasone;ADLs/Self Care Home Management   PT Next Visit Plan Introduce [facility]; Hip/knee strengthening; Balance and dynamic gait activities; Modalities as indicated including Ionto patch #2 of 6   Consulted and Agree with Plan of Care Patient      Patient will benefit from skilled therapeutic intervention in order to improve the following deficits and impairments:  Pain, Decreased strength, Decreased range of motion, Impaired flexibility, Decreased mobility, Decreased activity tolerance, Difficulty walking  Visit Diagnosis: Acute pain of left knee  Pain in left hip  Muscle weakness (generalized)  Difficulty in walking, not elsewhere classified  Unsteadiness on feet     Problem List Patient Active Problem List   Diagnosis Date Noted  . Gout 04/24/2015  . Medicare annual wellness visit, subsequent 10/31/2012  . Hyperlipidemia 12/06/2011  . Perirectal skin irritation 12/06/2011  . CAD (coronary artery disease) 04/03/2011  . Carotid stenosis 04/03/2011  . HTN (hypertension) 04/03/2011  . Hypothyroidism  04/03/2011  . Hyperglycemia 04/03/2011    JoPercival SpanishPT, MPT 11/20/2015, 10:16 AM  [facility]ona StreetSuPyoteiThree Rocks[place]aska[address]hone: [phone] Fax:  [phone]: RaTierre NettoRN: [id number] of Birth: 3/Jan 09, 1927

## 2015-11-22 ENCOUNTER — Other Ambulatory Visit: Payer: Self-pay | Admitting: Family Medicine

## 2015-11-25 ENCOUNTER — Ambulatory Visit: Payer: Medicare Other

## 2015-11-27 ENCOUNTER — Ambulatory Visit: Payer: Medicare Other | Attending: Physician Assistant | Admitting: Physical Therapy

## 2015-11-27 DIAGNOSIS — R2681 Unsteadiness on feet: Secondary | ICD-10-CM | POA: Diagnosis not present

## 2015-11-27 DIAGNOSIS — R262 Difficulty in walking, not elsewhere classified: Secondary | ICD-10-CM | POA: Diagnosis not present

## 2015-11-27 DIAGNOSIS — M6281 Muscle weakness (generalized): Secondary | ICD-10-CM | POA: Diagnosis not present

## 2015-11-27 DIAGNOSIS — M25562 Pain in left knee: Secondary | ICD-10-CM | POA: Insufficient documentation

## 2015-11-27 DIAGNOSIS — M25552 Pain in left hip: Secondary | ICD-10-CM | POA: Diagnosis not present

## 2015-11-27 NOTE — Therapy (Signed)
[facility] [address]  [place], [place], [address] Phone: [phone]   Fax:  [phone]  Physical Therapy Treatment  Patient Details  Name: Fernando Liu MRN: [id number] Date of Birth: Jun 09, 1926 Referring Provider: Brunetta Jeans, PA-C  Encounter Date: 11/27/2015      PT End of Session - 11/27/15 0931    Visit Number 9   Number of Visits 16   Date for PT Re-Evaluation 12/26/15   Authorization Type Medicare/AARP   PT Start Time 0931   PT Stop Time 1013   PT Time Calculation (min) 42 min   Activity Tolerance Patient tolerated treatment well   Behavior During Therapy [facility] for tasks assessed/performed      Past Medical History:  Diagnosis Date  . CAD (coronary artery disease)   . Chicken pox 82 yrs old  . Gout 04/24/2015  . High cholesterol   . Hypertension   . Measles as a child  . Medicare annual wellness visit, subsequent 10/31/2012   Living will wife and daughter have a copy, does not want heroic measures Sees cardiology his recent cardiologist is retiring his new doctor will be at [employer]   . Preventative health care 10/31/2012  . Thyroid disease     Past Surgical History:  Procedure Laterality Date  . CATARACT EXTRACTION, BILATERAL     August & November 2012  . HERNIA REPAIR    . PELVIC ABCESS DRAINAGE     states surgery affected control of BM  . TONSILLECTOMY      There were no vitals filed for this visit.      Subjective Assessment - 11/27/15 0936    Subjective Last PT visit cancelled due to pt feeling poorly and reporting dark stools since starting osteoporosis meds. Encouraged pt to f/u with MD, but pt wanting to wait until he sees MD at scheduled visit next Monday. Pt reports feeling better today but noting more fatigue with running errands yesterday to the point that he was too tired to complete HEP. Pt reports hip pain that he originally came to PT for has mostly resolved, but noting  variable pain in other areas such as back and sacpula.   Patient Stated Goals "get rid of the pain"   Currently in Pain? No/denies   Pain Score 1   while using NuStep   Pain Location Knee   Pain Orientation Left   Pain Score 0   Pain Location Hip   Pain Orientation Left            [facility] PT Assessment - 11/27/15 0931      Assessment   Medical Diagnosis L hip & knee pain   Referring Provider Brunetta Jeans, PA-C   Onset Date/Surgical Date --  2-3 weeks   Next MD Visit 12/01/15 with Dr. Charlett Blake     AROM   AROM Assessment Site Knee   Right Knee Extension 4   Left Knee Extension 1     Strength   Overall Strength Comments Tested in sitting   Right Hip Flexion 4+/5   Right Hip Extension 4-/5   Right Hip ABduction 4+/5   Right Hip ADduction 4/5   Left Hip Flexion 4/5   Left Hip Extension 4-/5   Left Hip ABduction 4/5   Left Hip ADduction 4/5   Right Knee Flexion 4+/5   Right Knee Extension 5/5   Left Knee Flexion 4+/5   Left Knee Extension 5/5  Standardized Balance Assessment   Standardized Balance Assessment Berg Balance Test;10 meter walk test   10 Meter Walk 3.47 ft/sec  9.44"     Berg Balance Test   Sit to Stand Able to stand  independently using hands   Standing Unsupported Able to stand safely 2 minutes   Sitting with Back Unsupported but Feet Supported on Floor or Stool Able to sit safely and securely 2 minutes   Stand to Sit Controls descent by using hands   Transfers Able to transfer safely, minor use of hands   Standing Unsupported with Eyes Closed Able to stand 10 seconds with supervision   Standing Ubsupported with Feet Together Able to place feet together independently and stand for 1 minute with supervision   From Standing, Reach Forward with Outstretched Arm Can reach forward >12 cm safely (5")   From Standing Position, Pick up Object from Floor Able to pick up shoe, needs supervision   From Standing Position, Turn to Look Behind Over each  Shoulder Looks behind one side only/other side shows less weight shift   Turn 360 Degrees Able to turn 360 degrees safely one side only in 4 seconds or less   Standing Unsupported, Alternately Place Feet on Step/Stool Able to stand independently and complete 8 steps >20 seconds   Standing Unsupported, One Foot in Front Able to take small step independently and hold 30 seconds   Standing on One Leg Tries to lift leg/unable to hold 3 seconds but remains standing independently   Total Score 42     Functional Gait  Assessment   Gait Level Surface Walks 20 ft in less than 7 sec but greater than 5.5 sec, uses assistive device, slower speed, mild gait deviations, or deviates 6-10 in outside of the 12 in walkway width.   Change in Gait Speed Able to change speed, demonstrates mild gait deviations, deviates 6-10 in outside of the 12 in walkway width, or no gait deviations, unable to achieve a major change in velocity, or uses a change in velocity, or uses an assistive device.   Gait with Horizontal Head Turns Performs head turns smoothly with slight change in gait velocity (eg, minor disruption to smooth gait path), deviates 6-10 in outside 12 in walkway width, or uses an assistive device.   Gait with Vertical Head Turns Performs task with slight change in gait velocity (eg, minor disruption to smooth gait path), deviates 6 - 10 in outside 12 in walkway width or uses assistive device   Gait and Pivot Turn Pivot turns safely in greater than 3 sec and stops with no loss of balance, or pivot turns safely within 3 sec and stops with mild imbalance, requires small steps to catch balance.   Step Over Obstacle Is able to step over one shoe box (4.5 in total height) but must slow down and adjust steps to clear box safely. May require verbal cueing.   Gait with Narrow Base of Support Ambulates 4-7 steps.   Gait with Eyes Closed Walks 20 ft, slow speed, abnormal gait pattern, evidence for imbalance, deviates 10-15 in  outside 12 in walkway width. Requires more than 9 sec to ambulate 20 ft.   Ambulating Backwards Walks 20 ft, slow speed, abnormal gait pattern, evidence for imbalance, deviates 10-15 in outside 12 in walkway width.   Steps Two feet to a stair, must use rail.   Total Score 15            Today's Treatment  TherEx NuStep -  lvl 6x 5'   PPT Berg Balance Test Functional Gait Assessment 10 MWT           PT Short Term Goals - 11/13/15 1015      PT SHORT TERM GOAL #1   Title Independent with initial HEP by 11/21/15   Status Achieved     PT SHORT TERM GOAL #2   Title Complete formal balance assessment as indicated by 11/21/15   Status Achieved           PT Long Term Goals - 11/27/15 0944      PT LONG TERM GOAL #1   Title Independent with advanced HEP as indicated by 12/26/15   Status Partially Met  Met for current HEP     PT LONG TERM GOAL #2   Title B hip and knee strength >/= 4/5 for improved function by 12/26/15   Status Partially Met  Met except B hip extension 4-/5     PT LONG TERM GOAL #3   Title Pt will report ability to walk for >/= 500 ft w/o increased L hip or knee pain by 12/26/15   Status On-going     PT LONG TERM GOAL #4   Title Increase Berg to >/= 48/56 to decrease fall risk by 12/26/15   Status On-going     PT LONG TERM GOAL #5   Title Increase FGA to >/= 20/30 to improve gait safety and stability by 12/26/15   Status On-going               Plan - 11/27/15 1010    Clinical Impression Statement Pt has demonstrated good initial progress with PT, noting decreased hip and knee pain, but still reporting variable pain in other areas inlcuding back and scapular region. LE strength has improved but L hip remains slighty weaker than R overall. Balance activities more recently added to [place] but already noting some improvement with standardized balance and functional gait testing. Pt continues report significant fatigue with activities/walking as  well as HEP, which seems to have worsened somewhat since starting osteoporosis meds with pt also noting dark stools.   PT Treatment/Interventions Patient/family education;Therapeutic exercise;Manual techniques;Passive range of motion;Dry needling;Taping;Neuromuscular re-education;Balance training;Therapeutic activities;Functional mobility training;Gait training;Electrical Stimulation;Cryotherapy;Moist Heat;Iontophoresis 60m/ml Dexamethasone;ADLs/Self Care Home Management   PT Next Visit Plan Introduce [facility]; Hip/knee strengthening; Balance and dynamic gait activities; Modalities as indicated including Ionto patch #2 of 6   Consulted and Agree with Plan of Care Patient      Patient will benefit from skilled therapeutic intervention in order to improve the following deficits and impairments:  Pain, Decreased strength, Decreased range of motion, Impaired flexibility, Decreased mobility, Decreased activity tolerance, Difficulty walking  Visit Diagnosis: Acute pain of left knee  Pain in left hip  Muscle weakness (generalized)  Difficulty in walking, not elsewhere classified  Unsteadiness on feet     Problem List Patient Active Problem List   Diagnosis Date Noted  . Gout 04/24/2015  . Medicare annual wellness visit, subsequent 10/31/2012  . Hyperlipidemia 12/06/2011  . Perirectal skin irritation 12/06/2011  . CAD (coronary artery disease) 04/03/2011  . Carotid stenosis 04/03/2011  . HTN (hypertension) 04/03/2011  . Hypothyroidism 04/03/2011  . Hyperglycemia 04/03/2011    JPercival Spanish PT, MPT 11/27/2015, 10:21 AM  [facility] [address]Phone: [phone]  Fax:  [phone] Name: Fernando DiamantMRN: [id number] of Birth:  [birth year]

## 2015-12-01 ENCOUNTER — Ambulatory Visit (INDEPENDENT_AMBULATORY_CARE_PROVIDER_SITE_OTHER): Payer: Medicare Other | Admitting: Family Medicine

## 2015-12-01 ENCOUNTER — Encounter: Payer: Self-pay | Admitting: Family Medicine

## 2015-12-01 VITALS — BP 92/44 | HR 96 | Temp 98.2°F | Wt 163.8 lb

## 2015-12-01 DIAGNOSIS — R739 Hyperglycemia, unspecified: Secondary | ICD-10-CM

## 2015-12-01 DIAGNOSIS — I2510 Atherosclerotic heart disease of native coronary artery without angina pectoris: Secondary | ICD-10-CM

## 2015-12-01 DIAGNOSIS — I10 Essential (primary) hypertension: Secondary | ICD-10-CM | POA: Diagnosis not present

## 2015-12-01 DIAGNOSIS — M810 Age-related osteoporosis without current pathological fracture: Secondary | ICD-10-CM | POA: Diagnosis not present

## 2015-12-01 DIAGNOSIS — M109 Gout, unspecified: Secondary | ICD-10-CM

## 2015-12-01 DIAGNOSIS — E039 Hypothyroidism, unspecified: Secondary | ICD-10-CM

## 2015-12-01 DIAGNOSIS — E785 Hyperlipidemia, unspecified: Secondary | ICD-10-CM | POA: Diagnosis not present

## 2015-12-01 HISTORY — DX: Age-related osteoporosis without current pathological fracture: M81.0

## 2015-12-01 LAB — COMPREHENSIVE METABOLIC PANEL
ALBUMIN: 3.2 g/dL — AB (ref 3.5–5.2)
ALT: 25 U/L (ref 0–53)
AST: 29 U/L (ref 0–37)
Alkaline Phosphatase: 138 U/L — ABNORMAL HIGH (ref 39–117)
BUN: 24 mg/dL — ABNORMAL HIGH (ref 6–23)
CALCIUM: 8.9 mg/dL (ref 8.4–10.5)
CHLORIDE: 110 meq/L (ref 96–112)
CO2: 25 mEq/L (ref 19–32)
Creatinine, Ser: 1.48 mg/dL (ref 0.40–1.50)
GFR: 47.71 mL/min — AB (ref >60.00)
Glucose, Bld: 103 mg/dL — ABNORMAL HIGH (ref 70–99)
POTASSIUM: 4.6 meq/L (ref 3.5–5.1)
Sodium: 141 mEq/L (ref 135–145)
Total Bilirubin: 0.5 mg/dL (ref 0.2–1.2)
Total Protein: 5.8 g/dL — ABNORMAL LOW (ref 6.0–8.3)

## 2015-12-01 LAB — VITAMIN D 25 HYDROXY (VIT D DEFICIENCY, FRACTURES): VITD: 44.86 ng/mL (ref 30.00–100.00)

## 2015-12-01 LAB — CBC
HCT: 32.4 % — ABNORMAL LOW (ref 39.0–52.0)
HEMOGLOBIN: 10.8 g/dL — AB (ref 13.0–17.0)
MCHC: 33.4 g/dL (ref 30.0–36.0)
MCV: 99.2 fl (ref 78.0–100.0)
PLATELETS: 166 10*3/uL (ref 150.0–400.0)
RBC: 3.27 Mil/uL — ABNORMAL LOW (ref 4.22–5.81)
RDW: 14.5 % (ref 11.5–15.5)
WBC: 6.7 10*3/uL (ref 4.0–10.5)

## 2015-12-01 MED ORDER — METOPROLOL TARTRATE 25 MG PO TABS: 12.5000 mg | ORAL_TABLET | Freq: Two times a day (BID) | ORAL | 4 refills | Status: DC

## 2015-12-01 NOTE — Assessment & Plan Note (Signed)
Did not tolerate Fosamax caused black stools daily til he stopped Fosamax and it has now resolved. Check Vitamin D level today they attempt PA for Prolia.

## 2015-12-01 NOTE — Patient Instructions (Signed)
Hypotension  As your heart beats, it forces blood through your arteries. This force is your blood pressure. If your blood pressure is too low for you to go about your normal activities or to support the organs of your body, you have hypotension. Hypotension is also referred to as low blood pressure. When your blood pressure becomes too low, you may not get enough blood to your brain. As a result, you may feel weak, feel lightheaded, or develop a rapid heart rate. In a more severe case, you may faint.  CAUSES  Various conditions can cause hypotension. These include:  · Blood loss.  · Dehydration.  · Heart or endocrine problems.  · Pregnancy.  · Severe infection.  · Not having a well-balanced diet filled with needed nutrients.  · Severe allergic reactions (anaphylaxis).  Some medicines, such as blood pressure medicine or water pills (diuretics), may lower your blood pressure below normal. Sometimes taking too much medicine or taking medicine not as directed can cause hypotension.  TREATMENT   Hospitalization is sometimes required for hypotension if fluid or blood replacement is needed, if time is needed for medicines to wear off, or if further monitoring is needed. Treatment might include changing your diet, changing your medicines (including medicines aimed at raising your blood pressure), and use of support stockings.  HOME CARE INSTRUCTIONS   · Drink enough fluids to keep your urine clear or pale yellow.  · Take your medicines as directed by your health care provider.  · Get up slowly from reclining or sitting positions. This gives your blood pressure a chance to adjust.  · Wear support stockings as directed by your health care provider.  · Maintain a healthy diet by including nutritious food, such as fruits, vegetables, nuts, whole grains, and lean meats.  SEEK MEDICAL CARE IF:  · You have vomiting or diarrhea.  · You have a fever for more than 2-3 days.  · You feel more thirsty than usual.  · You feel weak and  tired.  SEEK IMMEDIATE MEDICAL CARE IF:   · You have chest pain or a fast or irregular heartbeat.  · You have a loss of feeling in some part of your body, or you lose movement in your arms or legs.  · You have trouble speaking.  · You become sweaty or feel lightheaded.  · You faint.  MAKE SURE YOU:   · Understand these instructions.  · Will watch your condition.  · Will get help right away if you are not doing well or get worse.     This information is not intended to replace advice given to you by your health care provider. Make sure you discuss any questions you have with your health care provider.     Document Released: 01/11/2005 Document Revised: 11/01/2012 Document Reviewed: 07/14/2012  Elsevier Interactive Patient Education ©2016 [employer]

## 2015-12-01 NOTE — Assessment & Plan Note (Signed)
Low blood pressure today hold the Lisinopril for now. RN BP check tomorrow

## 2015-12-01 NOTE — Progress Notes (Signed)
Pre visit review using our clinic review tool, if applicable. No additional management support is needed unless otherwise documented below in the visit note. 

## 2015-12-01 NOTE — Assessment & Plan Note (Signed)
Good response to Allopurinol, numbers improved

## 2015-12-01 NOTE — Progress Notes (Addendum)
Patient ID: Fernando Liu, male   DOB: [birth year], 82 y.o.   MRN: [id number]   Subjective:    Patient ID: Fernando Liu, male    DOB: 01-02-1927, 82 y.o.   MRN: [id number]  Chief Complaint  Patient presents with  . Follow-up    HPI Patient is in today for a follow up patient c/o right hip pain today which is new. Historically he has had left hip and knee pain and is working with PT to improve his pain and get stronger. Unfortunately he felt a little wobbly and got himself with his right leg earlier and it has been hurting since then. Not severe. No recent falls or syncope. He was taking Fosamax but stopped it a week ago when he developed dark stool. Now off of it the dark stool has resolved. No abdominal pain or anorexia. Denies CP/palp/SOB/HA/congestion/fevers or GU c/o. Taking meds as prescribed  Past Medical History:  Diagnosis Date  . CAD (coronary artery disease)   . Chicken pox 82 yrs old  . Gout 04/24/2015  . High cholesterol   . Hypertension   . Measles as a child  . Medicare annual wellness visit, subsequent 10/31/2012   Living will wife and daughter have a copy, does not want heroic measures Sees cardiology his recent cardiologist is retiring his new doctor will be at [employer]   . Osteoporosis 12/01/2015  . Preventative health care 10/31/2012  . Thyroid disease     Past Surgical History:  Procedure Laterality Date  . CATARACT EXTRACTION, BILATERAL     August & November 2012  . HERNIA REPAIR    . PELVIC ABCESS DRAINAGE     states surgery affected control of BM  . TONSILLECTOMY      Family History  Problem Relation Age of Onset  . Stroke Mother   . Hypertension Mother   . Breast cancer Neg Hx   . Diabetes Neg Hx   . Colon cancer Neg Hx   . Prostate cancer Neg Hx   . Cancer Maternal Grandmother   . Heart disease Father   . Cancer Daughter     Social History   Social History  . Marital status: Married    Spouse name: N/A  . Number of children: N/A  . Years of  education: N/A   Occupational History  . Not on file.   Social History Main Topics  . Smoking status: Never Smoker  . Smokeless tobacco: Never Used  . Alcohol use 1.2 oz/week    2 Shots of liquor per week     Comment: daily  . Drug use: No  . Sexual activity: Not on file   Other Topics Concern  . Not on file   Social History Narrative  . No narrative on file    Outpatient Medications Prior to Visit  Medication Sig Dispense Refill  . alendronate (FOSAMAX) 70 MG tablet Take 1 tablet (70 mg total) by mouth once a week. Take with a full glass of water on an empty stomach. 4 tablet 4  . allopurinol (ZYLOPRIM) 100 MG tablet TAKE 1 TABLET (100 MG TOTAL) BY MOUTH DAILY. 30 tablet 6  . atorvastatin (LIPITOR) 10 MG tablet TAKE 1 TABLET BY MOUTH AT BEDTIME 30 tablet 4  . clopidogrel (PLAVIX) 75 MG tablet Take 75 mg by mouth daily.     Marland Kitchen levothyroxine (SYNTHROID, LEVOTHROID) 75 MCG tablet TAKE 1 TABLET BY MOUTH EVERY DAY 30 tablet 4  . MICRONIZED COLESTIPOL HCL 1 G tablet Take  1 g by mouth 2 (two) times daily.  2  . nitroGLYCERIN (NITROSTAT) 0.4 MG SL tablet Place 0.4 mg under the tongue every 5 (five) minutes as needed. For chest pain    . lisinopril (PRINIVIL,ZESTRIL) 10 MG tablet TAKE 1 TABLET DAILY FOR BLOOD PRESSURE. 30 tablet 4  . metoprolol tartrate (LOPRESSOR) 25 MG tablet TAKE 1 TABLET BY MOUTH EVERY DAY 30 tablet 4  . traMADol (ULTRAM) 50 MG tablet Take 1 tablet (50 mg total) by mouth every 12 (twelve) hours as needed. (Patient not taking: Reported on 12/01/2015) 10 tablet 0   No facility-administered medications prior to visit.     Allergies  Allergen Reactions  . Aspirin Swelling    Review of Systems  Constitutional: Positive for malaise/fatigue. Negative for fever.  Eyes: Negative for blurred vision.  Respiratory: Negative for cough and shortness of breath.   Cardiovascular: Negative for chest pain and palpitations.  Gastrointestinal: Negative for vomiting.    Musculoskeletal: Positive for back pain and joint pain.  Skin: Negative for rash.  Neurological: Positive for dizziness and weakness. Negative for loss of consciousness and headaches.       Objective:    Physical Exam  Constitutional: He is oriented to person, place, and time. He appears well-developed and well-nourished. No distress.  HENT:  Head: Normocephalic and atraumatic.  Eyes: Conjunctivae are normal.  Neck: Normal range of motion. No thyromegaly present.  Cardiovascular: Normal rate and regular rhythm.   Murmur heard. Pulmonary/Chest: Effort normal and breath sounds normal. He has no wheezes.  Abdominal: Soft. Bowel sounds are normal. There is no tenderness.  Musculoskeletal: Normal range of motion. He exhibits no edema or deformity.  Neurological: He is alert and oriented to person, place, and time.  Skin: Skin is warm and dry. He is not diaphoretic.  Psychiatric: He has a normal mood and affect.    BP (!) 92/44   Pulse 96   Temp 98.2 F (36.8 C) (Oral)   Wt 163 lb 12.8 oz (74.3 kg)   SpO2 99%   BMI 23.50 kg/m  Wt Readings from Last 3 Encounters:  12/01/15 163 lb 12.8 oz (74.3 kg)  10/21/15 166 lb 2 oz (75.4 kg)  06/19/15 173 lb 2 oz (78.5 kg)     Lab Results  Component Value Date   WBC 6.7 12/01/2015   HGB 10.8 (L) 12/01/2015   HCT 32.4 (L) 12/01/2015   PLT 166.0 12/01/2015   GLUCOSE 103 (H) 12/01/2015   CHOL 91 10/21/2015   TRIG 74.0 10/21/2015   HDL 44.30 10/21/2015   LDLCALC 32 10/21/2015   ALT 25 12/01/2015   AST 29 12/01/2015   NA 141 12/01/2015   K 4.6 12/01/2015   CL 110 12/01/2015   CREATININE 1.48 12/01/2015   BUN 24 (H) 12/01/2015   CO2 25 12/01/2015   TSH 2.23 10/21/2015   PSA 0.62 04/30/2013   HGBA1C 4.9 10/21/2015    Lab Results  Component Value Date   TSH 2.23 10/21/2015   Lab Results  Component Value Date   WBC 6.7 12/01/2015   HGB 10.8 (L) 12/01/2015   HCT 32.4 (L) 12/01/2015   MCV 99.2 12/01/2015   PLT 166.0  12/01/2015   Lab Results  Component Value Date   NA 141 12/01/2015   K 4.6 12/01/2015   CO2 25 12/01/2015   GLUCOSE 103 (H) 12/01/2015   BUN 24 (H) 12/01/2015   CREATININE 1.48 12/01/2015   BILITOT 0.5 12/01/2015   ALKPHOS 138 (  H) 12/01/2015   AST 29 12/01/2015   ALT 25 12/01/2015   PROT 5.8 (L) 12/01/2015   ALBUMIN 3.2 (L) 12/01/2015   CALCIUM 8.9 12/01/2015   GFR 47.71 (L) 12/01/2015   Lab Results  Component Value Date   CHOL 91 10/21/2015   Lab Results  Component Value Date   HDL 44.30 10/21/2015   Lab Results  Component Value Date   LDLCALC 32 10/21/2015   Lab Results  Component Value Date   TRIG 74.0 10/21/2015   Lab Results  Component Value Date   CHOLHDL 2 10/21/2015   Lab Results  Component Value Date   HGBA1C 4.9 10/21/2015       Assessment & Plan:   HTN (hypertension) Low blood pressure today hold the Lisinopril for now. RN BP check tomorrow  Osteoporosis Did not tolerate Fosamax caused black stools daily til he stopped Fosamax and it has now resolved. Check Vitamin D level today they attempt PA for Prolia.   Gout Good response to Allopurinol, numbers improved  Hyperglycemia hgba1c acceptable, minimize simple carbs. Increase exercise as tolerated. Continue current meds  Hypothyroidism On Levothyroxine, continue to monitor  Hyperlipidemia Tolerating statin, encouraged heart healthy diet, avoid trans fats, minimize simple carbs and saturated fats. Increase exercise as tolerated   I have discontinued Mr. Oppedisano's traMADol and lisinopril. I have also changed his metoprolol tartrate. Additionally, I am having him maintain his clopidogrel, nitroGLYCERIN, MICRONIZED COLESTIPOL HCL, alendronate, allopurinol, levothyroxine, and atorvastatin.  Meds ordered this encounter  Medications  . metoprolol tartrate (LOPRESSOR) 25 MG tablet    Sig: Take 0.5 tablets (12.5 mg total) by mouth 2 (two) times daily. Take 1/2 a tablet twice a day    Dispense:   30 tablet    Refill:  4     Penni Homans, MD

## 2015-12-02 ENCOUNTER — Other Ambulatory Visit: Payer: Self-pay | Admitting: Family Medicine

## 2015-12-02 ENCOUNTER — Ambulatory Visit: Payer: Medicare Other | Admitting: Physical Therapy

## 2015-12-02 ENCOUNTER — Ambulatory Visit (INDEPENDENT_AMBULATORY_CARE_PROVIDER_SITE_OTHER): Payer: Medicare Other | Admitting: Family Medicine

## 2015-12-02 VITALS — BP 128/70

## 2015-12-02 VITALS — BP 104/25 | HR 64

## 2015-12-02 DIAGNOSIS — M25562 Pain in left knee: Secondary | ICD-10-CM | POA: Diagnosis not present

## 2015-12-02 DIAGNOSIS — R2681 Unsteadiness on feet: Secondary | ICD-10-CM | POA: Diagnosis not present

## 2015-12-02 DIAGNOSIS — R262 Difficulty in walking, not elsewhere classified: Secondary | ICD-10-CM | POA: Diagnosis not present

## 2015-12-02 DIAGNOSIS — I2510 Atherosclerotic heart disease of native coronary artery without angina pectoris: Secondary | ICD-10-CM

## 2015-12-02 DIAGNOSIS — K625 Hemorrhage of anus and rectum: Secondary | ICD-10-CM

## 2015-12-02 DIAGNOSIS — M6281 Muscle weakness (generalized): Secondary | ICD-10-CM

## 2015-12-02 DIAGNOSIS — D539 Nutritional anemia, unspecified: Secondary | ICD-10-CM

## 2015-12-02 DIAGNOSIS — M25552 Pain in left hip: Secondary | ICD-10-CM | POA: Diagnosis not present

## 2015-12-02 DIAGNOSIS — I10 Essential (primary) hypertension: Secondary | ICD-10-CM | POA: Diagnosis not present

## 2015-12-02 NOTE — Progress Notes (Signed)
Pre visit review using our clinic tool,if applicable. No additional management support is needed unless otherwise documented below in the visit note.   Per Dr. Charlett Blake patient in for BP check. BP = 104/25 Pulse = 64  Patient to return for follow up on 12/12/15. No changes made on this visit.  Nurse blood pressure check note reviewed. Agree with documention and plan.   Penni Homans, MD

## 2015-12-02 NOTE — Therapy (Signed)
[facility] [address]  [place], [place], [address] Phone: [phone]   Fax:  [phone]  Physical Therapy Treatment  Patient Details  Name: Fernando Liu MRN: [id number] Date of Birth: 13-Feb-1926 Referring Provider: Brunetta Jeans, PA-C  Encounter Date: 12/02/2015      PT End of Session - 12/02/15 0934    Visit Number 10   Number of Visits 16   Date for PT Re-Evaluation 12/26/15   Authorization Type Medicare/AARP   PT Start Time 0934   PT Stop Time 1016   PT Time Calculation (min) 42 min   Activity Tolerance Patient tolerated treatment well   Behavior During Therapy [facility] for tasks assessed/performed      Past Medical History:  Diagnosis Date  . CAD (coronary artery disease)   . Chicken pox 82 yrs old  . Gout 04/24/2015  . High cholesterol   . Hypertension   . Measles as a child  . Medicare annual wellness visit, subsequent 10/31/2012   Living will wife and daughter have a copy, does not want heroic measures Sees cardiology his recent cardiologist is retiring his new doctor will be at [employer]   . Osteoporosis 12/01/2015  . Preventative health care 10/31/2012  . Thyroid disease     Past Surgical History:  Procedure Laterality Date  . CATARACT EXTRACTION, BILATERAL     August & November 2012  . HERNIA REPAIR    . PELVIC ABCESS DRAINAGE     states surgery affected control of BM  . TONSILLECTOMY      Vitals:   12/02/15 0937  BP: 128/70        Subjective Assessment - 12/02/15 0940    Subjective Pt reports when he saw the MD yesterday, his BP was very low (90/40) so his PCP is working on adjusting his BP meds. Notes pain has now shifted to his R hip as of yesterday but is basically unnoticable today.   Patient Stated Goals "get rid of the pain"   Currently in Pain? No/denies            [facility] PT Assessment - 12/02/15 0934      Observation/Other Assessments   Focus on Therapeutic  Outcomes (FOTO)  Hip 45% (55% limitation)         Today's Treatment  TherEx NuStep - lvl 6x 5'  Neuro OTAGO Fall Prevention Program (refer to flowsheet)         Balance Exercises - 12/02/15 0934      OTAGO PROGRAM   Head Movements Standing;5 reps   Neck Movements Standing;5 reps   Back Extension Standing;5 reps   Trunk Movements Standing;5 reps   Knee Extensor 20 reps   Knee Flexor 20 reps   Ankle Plantorflexors 20 reps, support   Ankle Dorsiflexors 20 reps, support   Knee Bends 20 reps, support   Backwards Walking Support   Walking and Turning Around No assistive device   Sideways Walking No assistive device   Tandem Stance 10 seconds, support   Tandem Walk Support   One Leg Stand 10 seconds, support   Heel Walking Support   Toe Walk Support   Sit to Stand 5 reps, one support           PT Education - 12/02/15 1015    Education provided Yes   Education Details OTAGO Fall Prevention Program - HEP activities highlighted; Pt to perform OTAGO on alteranting days with existing HEP  Person(s) Educated Patient   Methods Explanation;Demonstration;Handout   Comprehension Verbalized understanding;Returned demonstration;Need further instruction          PT Short Term Goals - 11/13/15 1015      PT SHORT TERM GOAL #1   Title Independent with initial HEP by 11/21/15   Status Achieved     PT SHORT TERM GOAL #2   Title Complete formal balance assessment as indicated by 11/21/15   Status Achieved           PT Long Term Goals - 11/27/15 0944      PT LONG TERM GOAL #1   Title Independent with advanced HEP as indicated by 12/26/15   Status Partially Met  Met for current HEP     PT LONG TERM GOAL #2   Title B hip and knee strength >/= 4/5 for improved function by 12/26/15   Status Partially Met  Met except B hip extension 4-/5     PT LONG TERM GOAL #3   Title Pt will report ability to walk for >/= 500 ft w/o increased L hip or knee pain by 12/26/15    Status On-going     PT LONG TERM GOAL #4   Title Increase Berg to >/= 48/56 to decrease fall risk by 12/26/15   Status On-going     PT LONG TERM GOAL #5   Title Increase FGA to >/= 20/30 to improve gait safety and stability by 12/26/15   Status On-going               Plan - 2015/12/21 1016    Clinical Impression Statement Pt reporting low BP noted during MD visit yesterday, therefore BP checked before initiating PT and was found to be WNL (128/70). Introduced [employer] and highlighted appropriate activities for pt to work on at home, with instructions for pt to alternate days with current HEP exercises.   PT Treatment/Interventions Patient/family education;Therapeutic exercise;Manual techniques;Passive range of motion;Dry needling;Taping;Neuromuscular re-education;Balance training;Therapeutic activities;Functional mobility training;Gait training;Electrical Stimulation;Cryotherapy;Moist Heat;Iontophoresis 31m/ml Dexamethasone;ADLs/Self Care Home Management   PT Next Visit Plan [facility] & [facility] as appropriate; Hip/knee strengthening; Balance and dynamic gait activities; Modalities as indicated including Ionto patch #2 of 6   Consulted and Agree with Plan of Care Patient      Patient will benefit from skilled therapeutic intervention in order to improve the following deficits and impairments:  Pain, Decreased strength, Decreased range of motion, Impaired flexibility, Decreased mobility, Decreased activity tolerance, Difficulty walking  Visit Diagnosis: Acute pain of left knee  Pain in left hip  Muscle weakness (generalized)  Difficulty in walking, not elsewhere classified  Unsteadiness on feet       G-Codes - 12017/11/260945    Functional Assessment Tool Used Hip FOTO = 45% (55% limitation)   Functional Limitation Mobility: Walking and moving around   Mobility: Walking and Moving Around Current Status (([id number] At least 40 percent  but less than 60 percent impaired, limited or restricted   Mobility: Walking and Moving Around Goal Status [phone] At least 40 percent but less than 60 percent impaired, limited or restricted      Problem List Patient Active Problem List   Diagnosis Date Noted  . Osteoporosis 12/01/2015  . Gout 04/24/2015  . Medicare annual wellness visit, subsequent 10/31/2012  . Hyperlipidemia 12/06/2011  . CAD (coronary artery disease) 04/03/2011  . Carotid stenosis 04/03/2011  . HTN (hypertension) 04/03/2011  . Hypothyroidism 04/03/2011  . Hyperglycemia 04/03/2011  Percival Spanish, PT, MPT 12/02/2015, 10:48 AM  [facility] [address]  [place], [place], [address] Phone: [phone]   Fax:  [phone]  Name: Fernando Liu MRN: [id number] Date of Birth: 03/15/1926

## 2015-12-02 NOTE — Patient Instructions (Signed)
  No changes with medications. Do not take Lisinopril as discussed at last visit. Return on scheduled appointment date.

## 2015-12-04 ENCOUNTER — Ambulatory Visit: Payer: Medicare Other

## 2015-12-04 DIAGNOSIS — M25552 Pain in left hip: Secondary | ICD-10-CM | POA: Diagnosis not present

## 2015-12-04 DIAGNOSIS — R262 Difficulty in walking, not elsewhere classified: Secondary | ICD-10-CM | POA: Diagnosis not present

## 2015-12-04 DIAGNOSIS — M6281 Muscle weakness (generalized): Secondary | ICD-10-CM

## 2015-12-04 DIAGNOSIS — M25562 Pain in left knee: Secondary | ICD-10-CM | POA: Diagnosis not present

## 2015-12-04 DIAGNOSIS — R2681 Unsteadiness on feet: Secondary | ICD-10-CM | POA: Diagnosis not present

## 2015-12-04 NOTE — Therapy (Signed)
[facility] [address]  [place], [place], [address] Phone: [phone]   Fax:  [phone]  Physical Therapy Treatment  Patient Details  Name: Fernando Liu MRN: [id number] Date of Birth: 01/11/1927 Referring Provider: Brunetta Jeans, PA-C  Encounter Date: 12/04/2015      PT End of Session - 12/04/15 0953    Visit Number 11   Number of Visits 16   Date for PT Re-Evaluation 12/26/15   Authorization Type Medicare/AARP   PT Start Time 0932   PT Stop Time 1012   PT Time Calculation (min) 40 min   Activity Tolerance Patient tolerated treatment well   Behavior During Therapy [facility] for tasks assessed/performed      Past Medical History:  Diagnosis Date  . CAD (coronary artery disease)   . Chicken pox 82 yrs old  . Gout 04/24/2015  . High cholesterol   . Hypertension   . Measles as a child  . Medicare annual wellness visit, subsequent 10/31/2012   Living will wife and daughter have a copy, does not want heroic measures Sees cardiology his recent cardiologist is retiring his new doctor will be at [employer]   . Osteoporosis 12/01/2015  . Preventative health care 10/31/2012  . Thyroid disease     Past Surgical History:  Procedure Laterality Date  . CATARACT EXTRACTION, BILATERAL     August & November 2012  . HERNIA REPAIR    . PELVIC ABCESS DRAINAGE     states surgery affected control of BM  . TONSILLECTOMY      There were no vitals filed for this visit.      Subjective Assessment - 12/04/15 0935    Subjective Pt. reporting he has been feeling good since having light-headed spell on Monday without having another spell since MD medication change.      Patient Stated Goals "get rid of the pain"   Currently in Pain? No/denies   Pain Score 0-No pain   Multiple Pain Sites No            [facility] PT Assessment - 12/04/15 1012      Assessment   Medical Diagnosis L hip & knee pain   Referring Provider  Fernando Jeans, PA-C   Next MD Visit 12/12/15 with Dr. Charlett Liu              Balance Exercises - 12/04/15 [place]   Head Movements Standing;5 reps   Neck Movements Standing;5 reps   Back Extension Standing;5 reps   Trunk Movements Standing;5 reps   Knee Extensor 20 reps   Knee Flexor 20 reps   Ankle Plantorflexors 20 reps, support   Ankle Dorsiflexors 20 reps, support   Knee Bends 20 reps, support   Walking and Turning Around No assistive device   Sideways Walking No assistive device   Tandem Stance 10 seconds, support  20 sec hold    Tandem Walk Support   One Leg Stand 10 seconds, support  2 sets   Heel Walking Support   Toe Walk Support   Sit to Stand 5 reps, one support  3 sets              PT Short Term Goals - 11/13/15 1015      PT SHORT TERM GOAL #1   Title Independent with initial HEP by 11/21/15   Status Achieved     PT SHORT TERM GOAL #2  Title Complete formal balance assessment as indicated by 11/21/15   Status Achieved           PT Long Term Goals - 11/27/15 0944      PT LONG TERM GOAL #1   Title Independent with advanced HEP as indicated by 12/26/15   Status Partially Met  Met for current HEP     PT LONG TERM GOAL #2   Title B hip and knee strength >/= 4/5 for improved function by 12/26/15   Status Partially Met  Met except B hip extension 4-/5     PT LONG TERM GOAL #3   Title Pt will report ability to walk for >/= 500 ft w/o increased L hip or knee pain by 12/26/15   Status On-going     PT LONG TERM GOAL #4   Title Increase Berg to >/= 48/56 to decrease fall risk by 12/26/15   Status On-going     PT LONG TERM GOAL #5   Title Increase FGA to >/= 20/30 to improve gait safety and stability by 12/26/15   Status On-going               Plan - 12/04/15 0953    Clinical Impression Statement Pt. reporting no more episodes of lightheadedness since Monday feeling like he is responding good to BP medication change  by MD.  Pt. with MD f/u regarding PT on 12/12/15.  Pt. able to progress reps and sets with OTAGO fall prevention program today however reporting he has not yet attempted program at home.  Pt. still requiring one hand push-off with sit<>stand, unable to perform without UE support.  Pt. progressing well at this point reporting he has been pain free today.     PT Treatment/Interventions Patient/family education;Therapeutic exercise;Manual techniques;Passive range of motion;Dry needling;Taping;Neuromuscular re-education;Balance training;Therapeutic activities;Functional mobility training;Gait training;Electrical Stimulation;Cryotherapy;Moist Heat;Iontophoresis 33m/ml Dexamethasone;ADLs/Self Care Home Management   PT Next Visit Plan Continue [facility] as appropriate; Hip/knee strengthening; Balance and dynamic gait activities; Modalities as indicated including Ionto patch #2 of 6      Patient will benefit from skilled therapeutic intervention in order to improve the following deficits and impairments:  Pain, Decreased strength, Decreased range of motion, Impaired flexibility, Decreased mobility, Decreased activity tolerance, Difficulty walking  Visit Diagnosis: Acute pain of left knee  Pain in left hip  Muscle weakness (generalized)  Difficulty in walking, not elsewhere classified  Unsteadiness on feet     Problem List Patient Active Problem List   Diagnosis Date Noted  . Osteoporosis 12/01/2015  . Gout 04/24/2015  . Medicare annual wellness visit, subsequent 10/31/2012  . Hyperlipidemia 12/06/2011  . CAD (coronary artery disease) 04/03/2011  . Carotid stenosis 04/03/2011  . HTN (hypertension) 04/03/2011  . Hypothyroidism 04/03/2011  . Hyperglycemia 04/03/2011    Fernando Liu 12/04/15 10:57 AM   [facility] [place] [address]Phone: [phone]   Fax:  [phone] Name: Fernando LutyMRN: [id number] of Birth: [birth year]

## 2015-12-07 NOTE — Assessment & Plan Note (Signed)
hgba1c acceptable, minimize simple carbs. Increase exercise as tolerated. Continue current meds 

## 2015-12-07 NOTE — Assessment & Plan Note (Signed)
On Levothyroxine, continue to monitor 

## 2015-12-07 NOTE — Assessment & Plan Note (Signed)
Tolerating statin, encouraged heart healthy diet, avoid trans fats, minimize simple carbs and saturated fats. Increase exercise as tolerated 

## 2015-12-09 ENCOUNTER — Other Ambulatory Visit (INDEPENDENT_AMBULATORY_CARE_PROVIDER_SITE_OTHER): Payer: Medicare Other

## 2015-12-09 ENCOUNTER — Other Ambulatory Visit: Payer: Self-pay | Admitting: Family Medicine

## 2015-12-09 ENCOUNTER — Ambulatory Visit: Payer: Medicare Other

## 2015-12-09 DIAGNOSIS — M25562 Pain in left knee: Secondary | ICD-10-CM | POA: Diagnosis not present

## 2015-12-09 DIAGNOSIS — M6281 Muscle weakness (generalized): Secondary | ICD-10-CM | POA: Diagnosis not present

## 2015-12-09 DIAGNOSIS — D539 Nutritional anemia, unspecified: Secondary | ICD-10-CM

## 2015-12-09 DIAGNOSIS — M25552 Pain in left hip: Secondary | ICD-10-CM

## 2015-12-09 DIAGNOSIS — R2681 Unsteadiness on feet: Secondary | ICD-10-CM | POA: Diagnosis not present

## 2015-12-09 DIAGNOSIS — R262 Difficulty in walking, not elsewhere classified: Secondary | ICD-10-CM | POA: Diagnosis not present

## 2015-12-09 DIAGNOSIS — K625 Hemorrhage of anus and rectum: Secondary | ICD-10-CM | POA: Diagnosis not present

## 2015-12-09 DIAGNOSIS — D649 Anemia, unspecified: Secondary | ICD-10-CM

## 2015-12-09 LAB — CBC WITH DIFFERENTIAL/PLATELET
BASOS ABS: 0 10*3/uL (ref 0.0–0.1)
Basophils Relative: 0.5 % (ref 0.0–3.0)
EOS ABS: 0.5 10*3/uL (ref 0.0–0.7)
Eosinophils Relative: 6.6 % — ABNORMAL HIGH (ref 0.0–5.0)
HEMATOCRIT: 35.3 % — AB (ref 39.0–52.0)
Hemoglobin: 11.7 g/dL — ABNORMAL LOW (ref 13.0–17.0)
LYMPHS ABS: 1.2 10*3/uL (ref 0.7–4.0)
LYMPHS PCT: 14.8 % (ref 12.0–46.0)
MCHC: 33.1 g/dL (ref 30.0–36.0)
MCV: 99.1 fl (ref 78.0–100.0)
Monocytes Absolute: 0.8 10*3/uL (ref 0.1–1.0)
Monocytes Relative: 9.7 % (ref 3.0–12.0)
NEUTROS ABS: 5.5 10*3/uL (ref 1.4–7.7)
NEUTROS PCT: 68.4 % (ref 43.0–77.0)
PLATELETS: 198 10*3/uL (ref 150.0–400.0)
RBC: 3.56 Mil/uL — AB (ref 4.22–5.81)
RDW: 14 % (ref 11.5–15.5)
WBC: 8.1 10*3/uL (ref 4.0–10.5)

## 2015-12-09 LAB — FECAL OCCULT BLOOD, IMMUNOCHEMICAL: Fecal Occult Bld: POSITIVE — AB

## 2015-12-09 LAB — VITAMIN B12: Vitamin B-12: 620 pg/mL (ref 211–911)

## 2015-12-09 MED ORDER — FERROUS FUMARATE 324 (106 FE) MG PO TABS: 1.0000 | ORAL_TABLET | Freq: Every day | ORAL | 2 refills | Status: DC

## 2015-12-09 MED ORDER — RANITIDINE HCL 150 MG PO CAPS
150.0000 mg | ORAL_CAPSULE | Freq: Every evening | ORAL | 2 refills | Status: DC
Start: 2015-12-09 — End: 2015-12-12

## 2015-12-09 NOTE — Therapy (Signed)
[facility] [address]  [place], [place], [address] Phone: [phone]   Fax:  [phone]  Physical Therapy Treatment  Patient Details  Name: Fernando Liu MRN: [id number] Date of Birth: 15-Jun-1926 Referring Provider: Brunetta Jeans, PA-C  Encounter Date: 12/09/2015      PT End of Session - 12/09/15 0947    Visit Number 12   Number of Visits 16   Date for PT Re-Evaluation 12/26/15   Authorization Type Medicare/AARP   PT Start Time 0932   PT Stop Time 1012   PT Time Calculation (min) 40 min   Activity Tolerance Patient tolerated treatment well   Behavior During Therapy [facility] for tasks assessed/performed      Past Medical History:  Diagnosis Date  . CAD (coronary artery disease)   . Chicken pox 82 yrs old  . Gout 04/24/2015  . High cholesterol   . Hypertension   . Measles as a child  . Medicare annual wellness visit, subsequent 10/31/2012   Living will wife and daughter have a copy, does not want heroic measures Sees cardiology his recent cardiologist is retiring his new doctor will be at [employer]   . Osteoporosis 12/01/2015  . Preventative health care 10/31/2012  . Thyroid disease     Past Surgical History:  Procedure Laterality Date  . CATARACT EXTRACTION, BILATERAL     August & November 2012  . HERNIA REPAIR    . PELVIC ABCESS DRAINAGE     states surgery affected control of BM  . TONSILLECTOMY      There were no vitals filed for this visit.      Subjective Assessment - 12/09/15 0942    Subjective Pt. reporting he feels a bit weak this morning due to giving blood earlier this morning.     Patient Stated Goals "get rid of the pain"   Currently in Pain? No/denies   Pain Score 0-No pain   Multiple Pain Sites No   Pain Score 0            [facility] PT Assessment - 12/09/15 1029      Strength   Overall Strength Comments --   Right Hip Flexion 4+/5   Right Hip Extension 4/5  tested in  standing due to pt. LBP in prone    Right Hip ABduction 4+/5   Right Hip ADduction 4/5   Left Hip Flexion 4/5   Left Hip Extension 4/5  tested in standing due to pt. LBP in prone    Left Hip ABduction 4/5   Left Hip ADduction 4/5   Right Knee Flexion 4+/5   Right Knee Extension 5/5   Left Knee Flexion 4+/5   Left Knee Extension 5/5       Today's Treatment:  TherEx: NuStep - lvl 6x  5' Hooklying bridge x 10 reps  Hooklying bridge with B abduction/ER with green TB x 10 reps  Strength testing   Goal testing   Gait training:  Pt. Able to ambulate 550 ft without AD and supervision from therapist with only mild L calf pain and mild fatigue reported; pt. pain free in L knee and hip   Self-care management:  Floor <> mat table transfer (due to pt. report of difficulty getting up after episode of, "controlled fall" a few weeks ago):        Pt. Able to transfer floor <>mat table however required instruction for proper technique and demo'd mod/max difficulty with this; only  supervision provided from therapist        PT Short Term Goals - 11/13/15 1015      PT SHORT TERM GOAL #1   Title Independent with initial HEP by 11/21/15   Status Achieved     PT SHORT TERM GOAL #2   Title Complete formal balance assessment as indicated by 11/21/15   Status Achieved           PT Long Term Goals - 12/09/15 0953      PT LONG TERM GOAL #1   Title Independent with advanced HEP as indicated by 12/26/15   Status Achieved  Met for OTAGO activities and original standing activities.       PT LONG TERM GOAL #2   Title B hip and knee strength >/= 4/5 for improved function by 12/26/15   Status Achieved     PT LONG TERM GOAL #3   Title Pt will report ability to walk for >/= 500 ft w/o increased L hip or knee pain by 12/26/15   Status Achieved  slight L calf pain following ambulation of 550 ft without AD.      PT LONG TERM GOAL #4   Title Increase Berg to >/= 48/56 to decrease fall risk  by 12/26/15   Status On-going     PT LONG TERM GOAL #5   Title Increase FGA to >/= 20/30 to improve gait safety and stability by 12/26/15   Status On-going               Plan - 12/09/15 0948    Clinical Impression Statement Pt. pain free today in hips and knees however reporting mild soreness in low back without assigning a number to this.  Pt. able to meet ambulation goal of 500 ft without AD with only mild L calf pain and some unsteadiness noted however otherwise pain free.  Pt. able to demo B hip extension strength of 4/5 today meeting hip/knee strength goal.  Floor transfers reviewed with pt. upon pt. request today due to report of difficulty rising from floor after what was described as, "controlled fall" a few weeks ago.  Pt. demonstrating mod/max difficulty with this and would benefit from further skilled instruction to maximize pt. ability to recover following fall.  FGA and BERG testing not performed today due to time constraints.  Will plan to perform this testing next visit in preparation for MD f/u on 12/12/15.   PT Treatment/Interventions Patient/family education;Therapeutic exercise;Manual techniques;Passive range of motion;Dry needling;Taping;Neuromuscular re-education;Balance training;Therapeutic activities;Functional mobility training;Gait training;Electrical Stimulation;Cryotherapy;Moist Heat;Iontophoresis 52m/ml Dexamethasone;ADLs/Self Care Home Management   PT Next Visit Plan FGA and BERG balance TESTING; Review floor transfers prn; Continue reviewing & progressin O[facility] Homesas appropriate; Hip/knee strengthening; Balance and dynamic gait activities; Modalities as indicated including Ionto patch #2 of 6      Patient will benefit from skilled therapeutic intervention in order to improve the following deficits and impairments:  Pain, Decreased strength, Decreased range of motion, Impaired flexibility, Decreased mobility, Decreased activity tolerance,  Difficulty walking  Visit Diagnosis: Acute pain of left knee  Pain in left hip  Muscle weakness (generalized)  Difficulty in walking, not elsewhere classified  Unsteadiness on feet     Problem List Patient Active Problem List   Diagnosis Date Noted  . Osteoporosis 12/01/2015  . Gout 04/24/2015  . Medicare annual wellness visit, subsequent 10/31/2012  . Hyperlipidemia 12/06/2011  . CAD (coronary artery disease) 04/03/2011  . Carotid stenosis 04/03/2011  . HTN (  hypertension) 04/03/2011  . Hypothyroidism 04/03/2011  . Hyperglycemia 04/03/2011    Bess Harvest, PTA 12/09/15 10:56 AM  [facility] [address]  [place], [place], [address] Phone: [phone]   Fax:  [phone]  Name: Fernando Liu MRN: [id number] Date of Birth: 02-01-1926

## 2015-12-11 ENCOUNTER — Ambulatory Visit: Payer: Medicare Other

## 2015-12-11 DIAGNOSIS — R262 Difficulty in walking, not elsewhere classified: Secondary | ICD-10-CM

## 2015-12-11 DIAGNOSIS — M6281 Muscle weakness (generalized): Secondary | ICD-10-CM | POA: Diagnosis not present

## 2015-12-11 DIAGNOSIS — M25562 Pain in left knee: Secondary | ICD-10-CM | POA: Diagnosis not present

## 2015-12-11 DIAGNOSIS — R2681 Unsteadiness on feet: Secondary | ICD-10-CM

## 2015-12-11 DIAGNOSIS — M25552 Pain in left hip: Secondary | ICD-10-CM | POA: Diagnosis not present

## 2015-12-11 NOTE — Therapy (Signed)
[facility] [address]  [place], [place], [address] Phone: [phone]   Fax:  [phone]  Physical Therapy Treatment  Patient Details  Name: Fernando Liu MRN: [id number] Date of Birth: 07-Jun-1926 Referring Provider: Brunetta Jeans, PA-C  Encounter Date: 12/11/2015      PT End of Session - 12/11/15 1056    Visit Number 13   Number of Visits 16   Date for PT Re-Evaluation 12/26/15   Authorization Type Medicare/AARP   PT Start Time 0933   PT Stop Time 1022   PT Time Calculation (min) 49 min   Activity Tolerance Patient tolerated treatment well   Behavior During Therapy [facility] for tasks assessed/performed      Past Medical History:  Diagnosis Date  . CAD (coronary artery disease)   . Chicken pox 82 yrs old  . Gout 04/24/2015  . High cholesterol   . Hypertension   . Measles as a child  . Medicare annual wellness visit, subsequent 10/31/2012   Living will wife and daughter have a copy, does not want heroic measures Sees cardiology his recent cardiologist is retiring his new doctor will be at [employer]   . Osteoporosis 12/01/2015  . Preventative health care 10/31/2012  . Thyroid disease     Past Surgical History:  Procedure Laterality Date  . CATARACT EXTRACTION, BILATERAL     August & November 2012  . HERNIA REPAIR    . PELVIC ABCESS DRAINAGE     states surgery affected control of BM  . TONSILLECTOMY      There were no vitals filed for this visit.      Subjective Assessment - 12/11/15 1126    Subjective Pt. reporting mild L anterior hip pain today however unable to assign a number to this.  Pt. reporting he is feeling a little tired today following performance of entire OTAGO balance HEP yesterday.     Patient Stated Goals "get rid of the pain"   Currently in Pain? No/denies   Pain Score 0-No pain   Multiple Pain Sites No            [facility] PT Assessment - 12/11/15 1005      Berg Balance  Test   Sit to Stand Able to stand  independently using hands   Standing Unsupported Able to stand safely 2 minutes   Sitting with Back Unsupported but Feet Supported on Floor or Stool Able to sit safely and securely 2 minutes   Stand to Sit Sits safely with minimal use of hands   Transfers Able to transfer safely, minor use of hands   Standing Unsupported with Eyes Closed Able to stand 10 seconds safely   Standing Ubsupported with Feet Together Able to place feet together independently and stand for 1 minute with supervision   From Standing, Reach Forward with Outstretched Arm Can reach forward >12 cm safely (5")   From Standing Position, Pick up Object from Floor Able to pick up shoe, needs supervision   From Standing Position, Turn to Look Behind Over each Shoulder Looks behind one side only/other side shows less weight shift   Turn 360 Degrees Able to turn 360 degrees safely in 4 seconds or less   Standing Unsupported, Alternately Place Feet on Step/Stool Able to stand independently and safely and complete 8 steps in 20 seconds   Standing Unsupported, One Foot in Front Able to plae foot ahead of the other independently and hold 30 seconds  Standing on One Leg Tries to lift leg/unable to hold 3 seconds but remains standing independently   Total Score 47     Functional Gait  Assessment   Gait assessed  Yes   Gait Level Surface Walks 20 ft in less than 5.5 sec, no assistive devices, good speed, no evidence for imbalance, normal gait pattern, deviates no more than 6 in outside of the 12 in walkway width.   Change in Gait Speed Able to change speed, demonstrates mild gait deviations, deviates 6-10 in outside of the 12 in walkway width, or no gait deviations, unable to achieve a major change in velocity, or uses a change in velocity, or uses an assistive device.   Gait with Horizontal Head Turns Performs head turns smoothly with slight change in gait velocity (eg, minor disruption to smooth gait  path), deviates 6-10 in outside 12 in walkway width, or uses an assistive device.   Gait with Vertical Head Turns Performs task with slight change in gait velocity (eg, minor disruption to smooth gait path), deviates 6 - 10 in outside 12 in walkway width or uses assistive device   Gait and Pivot Turn Pivot turns safely within 3 sec and stops quickly with no loss of balance.   Step Over Obstacle Is able to step over one shoe box (4.5 in total height) but must slow down and adjust steps to clear box safely. May require verbal cueing.   Gait with Narrow Base of Support Ambulates 4-7 steps.   Gait with Eyes Closed Walks 20 ft, slow speed, abnormal gait pattern, evidence for imbalance, deviates 10-15 in outside 12 in walkway width. Requires more than 9 sec to ambulate 20 ft.   Ambulating Backwards Walks 20 ft, slow speed, abnormal gait pattern, evidence for imbalance, deviates 10-15 in outside 12 in walkway width.   Steps Two feet to a stair, must use rail.   Total Score 17              PT Short Term Goals - 11/13/15 1015      PT SHORT TERM GOAL #1   Title Independent with initial HEP by 11/21/15   Status Achieved     PT SHORT TERM GOAL #2   Title Complete formal balance assessment as indicated by 11/21/15   Status Achieved           PT Long Term Goals - 12/11/15 1135      PT LONG TERM GOAL #1   Title Independent with advanced HEP as indicated by 12/26/15   Status Achieved  Met for OTAGO activities and original standing activities.       PT LONG TERM GOAL #2   Title B hip and knee strength >/= 4/5 for improved function by 12/26/15   Status Achieved     PT LONG TERM GOAL #3   Title Pt will report ability to walk for >/= 500 ft w/o increased L hip or knee pain by 12/26/15   Status Achieved  slight L calf pain following ambulation of 550 ft without AD.      PT LONG TERM GOAL #4   Title Increase Berg to >/= 48/56 to decrease fall risk by 12/26/15   Status On-going  11.16.17:  47/56 score      PT LONG TERM GOAL #5   Title Increase FGA to >/= 20/30 to improve gait safety and stability by 12/26/15   Status On-going  11.16.17: score of 17/30.  Plan - 12/11/15 1136    Clinical Impression Statement Pt. reporting mild L anterior hip soreness today and B LE tiredness today following performance of OTAGO balance HEP yesterday.  Pt. able to demo improvement on BERG Balance testing today by 5 points from 42/56 taken on 11/27/15 to 47/56 score today.  Pt. also able to demo mild improvement on FGA testing today with a score of 15/30 on 11/27/15 and a 17/30 today.  Pt. with MD f/u tomorrow (12/12/15) and seems to be progressing well at this point.     PT Treatment/Interventions Patient/family education;Therapeutic exercise;Manual techniques;Passive range of motion;Dry needling;Taping;Neuromuscular re-education;Balance training;Therapeutic activities;Functional mobility training;Gait training;Electrical Stimulation;Cryotherapy;Moist Heat;Iontophoresis 45m/ml Dexamethasone;ADLs/Self Care Home Management   PT Next Visit Plan Review floor transfers prn; [facility]; Hip/knee strengthening; Balance and dynamic gait activities; Modalities as indicated including Ionto patch #2 of 6      Patient will benefit from skilled therapeutic intervention in order to improve the following deficits and impairments:  Pain, Decreased strength, Decreased range of motion, Impaired flexibility, Decreased mobility, Decreased activity tolerance, Difficulty walking  Visit Diagnosis: Acute pain of left knee  Pain in left hip  Muscle weakness (generalized)  Difficulty in walking, not elsewhere classified  Unsteadiness on feet     Problem List Patient Active Problem List   Diagnosis Date Noted  . Osteoporosis 12/01/2015  . Gout 04/24/2015  . Medicare annual wellness visit, subsequent 10/31/2012  . Hyperlipidemia  12/06/2011  . CAD (coronary artery disease) 04/03/2011  . Carotid stenosis 04/03/2011  . HTN (hypertension) 04/03/2011  . Hypothyroidism 04/03/2011  . Hyperglycemia 04/03/2011    MBess Harvest PTA 12/11/15 11:41 AM  [facility] [address] [place] [address]Phone: [phone]  Fax:  [phone] Name: Fernando KautzmanMRN: [id number] of Birth: [birth year]

## 2015-12-12 ENCOUNTER — Ambulatory Visit (INDEPENDENT_AMBULATORY_CARE_PROVIDER_SITE_OTHER): Payer: Medicare Other | Admitting: Family Medicine

## 2015-12-12 ENCOUNTER — Encounter: Payer: Self-pay | Admitting: Family Medicine

## 2015-12-12 VITALS — BP 112/52 | HR 71 | Temp 97.8°F | Wt 166.4 lb

## 2015-12-12 DIAGNOSIS — I10 Essential (primary) hypertension: Secondary | ICD-10-CM

## 2015-12-12 DIAGNOSIS — M810 Age-related osteoporosis without current pathological fracture: Secondary | ICD-10-CM | POA: Diagnosis not present

## 2015-12-12 DIAGNOSIS — E785 Hyperlipidemia, unspecified: Secondary | ICD-10-CM | POA: Diagnosis not present

## 2015-12-12 DIAGNOSIS — K625 Hemorrhage of anus and rectum: Secondary | ICD-10-CM | POA: Diagnosis not present

## 2015-12-12 DIAGNOSIS — I2510 Atherosclerotic heart disease of native coronary artery without angina pectoris: Secondary | ICD-10-CM | POA: Diagnosis not present

## 2015-12-12 DIAGNOSIS — D509 Iron deficiency anemia, unspecified: Secondary | ICD-10-CM

## 2015-12-12 MED ORDER — HYDROCORTISONE ACETATE 25 MG RE SUPP: 25.0000 mg | Freq: Every evening | RECTAL | 0 refills | Status: DC | PRN

## 2015-12-12 MED ORDER — RANITIDINE HCL 150 MG PO CAPS: 150.0000 mg | ORAL_CAPSULE | Freq: Every evening | ORAL | 2 refills | Status: AC

## 2015-12-12 MED ORDER — FERROUS FUMARATE 324 (106 FE) MG PO TABS: 1.0000 | ORAL_TABLET | Freq: Every day | ORAL | 2 refills | Status: DC

## 2015-12-12 NOTE — Progress Notes (Signed)
Pre visit review using our clinic review tool, if applicable. No additional management support is needed unless otherwise documented below in the visit note. 

## 2015-12-12 NOTE — Patient Instructions (Signed)
Prolia injections twice yearly at the office Osteoporosis Osteoporosis is the thinning and loss of density in the bones. Osteoporosis makes the bones more brittle, fragile, and likely to break (fracture). Over time, osteoporosis can cause the bones to become so weak that they fracture after a simple fall. The bones most likely to fracture are the bones in the hip, wrist, and spine. What are the causes? The exact cause is not known. What increases the risk? Anyone can develop osteoporosis. You may be at greater risk if you have a family history of the condition or have poor nutrition. You may also have a higher risk if you are:  Male.  80 years old or older.  A smoker.  Not physically active.  White or Asian.  Slender. What are the signs or symptoms? A fracture might be the first sign of the disease, especially if it results from a fall or injury that would not usually cause a bone to break. Other signs and symptoms include:  Low back and neck pain.  Stooped posture.  Height loss. How is this diagnosed? To make a diagnosis, your health care provider may:  Take a medical history.  Perform a physical exam.  Order tests, such as:  A bone mineral density test.  A dual-energy X-ray absorptiometry test. How is this treated? The goal of osteoporosis treatment is to strengthen your bones to reduce your risk of a fracture. Treatment may involve:  Making lifestyle changes, such as:  Eating a diet rich in calcium.  Doing weight-bearing and muscle-strengthening exercises.  Stopping tobacco use.  Limiting alcohol intake.  Taking medicine to slow the process of bone loss or to increase bone density.  Monitoring your levels of calcium and vitamin D. Follow these instructions at home:  Include calcium and vitamin D in your diet. Calcium is important for bone health, and vitamin D helps the body absorb calcium.  Perform weight-bearing and muscle-strengthening exercises as  directed by your health care provider.  Do not use any tobacco products, including cigarettes, chewing tobacco, and electronic cigarettes. If you need help quitting, ask your health care provider.  Limit your alcohol intake.  Take medicines only as directed by your health care provider.  Keep all follow-up visits as directed by your health care provider. This is important.  Take precautions at home to lower your risk of falling, such as:  Keeping rooms well lit and clutter free.  Installing safety rails on stairs.  Using rubber mats in the bathroom and other areas that are often wet or slippery. Get help right away if: You fall or injure yourself. This information is not intended to replace advice given to you by your health care provider. Make sure you discuss any questions you have with your health care provider. Document Released: 10/21/2004 Document Revised: 06/16/2015 Document Reviewed: 06/21/2013 Elsevier Interactive Patient Education  2017 [employer].

## 2015-12-16 ENCOUNTER — Ambulatory Visit: Payer: Medicare Other | Admitting: Physical Therapy

## 2015-12-16 DIAGNOSIS — R2681 Unsteadiness on feet: Secondary | ICD-10-CM | POA: Diagnosis not present

## 2015-12-16 DIAGNOSIS — M25552 Pain in left hip: Secondary | ICD-10-CM

## 2015-12-16 DIAGNOSIS — M25562 Pain in left knee: Secondary | ICD-10-CM | POA: Diagnosis not present

## 2015-12-16 DIAGNOSIS — R262 Difficulty in walking, not elsewhere classified: Secondary | ICD-10-CM

## 2015-12-16 DIAGNOSIS — M6281 Muscle weakness (generalized): Secondary | ICD-10-CM | POA: Diagnosis not present

## 2015-12-16 NOTE — Therapy (Signed)
[facility] [address]  [place], [place], [address] Phone: [phone]   Fax:  [phone]  Physical Therapy Treatment  Patient Details  Name: Fernando Liu MRN: [id number] Date of Birth: 05/24/1926 Referring Provider: Brunetta Jeans, PA-C  Encounter Date: 12/16/2015      PT End of Session - 12/16/15 0928    Visit Number 14   Number of Visits 16   Date for PT Re-Evaluation 12/26/15   Authorization Type Medicare/AARP   PT Start Time 0928   PT Stop Time 1012   PT Time Calculation (min) 44 min   Activity Tolerance Patient tolerated treatment well   Behavior During Therapy [facility] for tasks assessed/performed      Past Medical History:  Diagnosis Date  . CAD (coronary artery disease)   . Chicken pox 82 yrs old  . Gout 04/24/2015  . High cholesterol   . Hypertension   . Measles as a child  . Medicare annual wellness visit, subsequent 10/31/2012   Living will wife and daughter have a copy, does not want heroic measures Sees cardiology his recent cardiologist is retiring his new doctor will be at [employer]   . Osteoporosis 12/01/2015  . Preventative health care 10/31/2012  . Thyroid disease     Past Surgical History:  Procedure Laterality Date  . CATARACT EXTRACTION, BILATERAL     August & November 2012  . HERNIA REPAIR    . PELVIC ABCESS DRAINAGE     states surgery affected control of BM  . TONSILLECTOMY      There were no vitals filed for this visit.      Subjective Assessment - 12/16/15 0930    Subjective Pt reports no lasting pain recently like what brought him to PT, but still experiences brief pain in L low back with bending over or trying to lift leg. Reports he went with his wife to [place] [employer] but had to walk ~1 mile from where they had to park, which greatly fatigued him.   Patient Stated Goals "get rid of the pain"   Currently in Pain? No/denies          Today's  Treatment  TherEx NuStep - lvl 6 x 5' Seated Alt Hip ABD/ER with blue TB 10x3" Seated Alt Hip March with blue TB 10x3" Standing at counter: Alt 3 way Hip (Flexion, ABD &Extension) SLR with yellow TB x10 each Alt Hip Flexion march x10    Heel Raises x20    Toe Raises x25    Squat x20 (cues for posture & alignment)    Alt Fwd Lunge x10 each    Alt Fwd Step-up to aerobic step (no risers) x15    B Lat Step-up to aerobic step (no risers) x15 B TKE with small ball on wall 10x3"           PT Education - 12/16/15 1010    Education provided Yes   Education Details Progressed TB to blue for seated HEP exercises & added yellow TB to standing 3 way hip SLR   Person(s) Educated Patient   Methods Explanation;Demonstration;Handout   Comprehension Verbalized understanding;Returned demonstration          PT Short Term Goals - 11/13/15 1015      PT SHORT TERM GOAL #1   Title Independent with initial HEP by 11/21/15   Status Achieved     PT SHORT TERM GOAL #2   Title Complete formal balance  assessment as indicated by 11/21/15   Status Achieved           PT Long Term Goals - 12/11/15 1135      PT LONG TERM GOAL #1   Title Independent with advanced HEP as indicated by 12/26/15   Status Achieved  Met for OTAGO activities and original standing activities.       PT LONG TERM GOAL #2   Title B hip and knee strength >/= 4/5 for improved function by 12/26/15   Status Achieved     PT LONG TERM GOAL #3   Title Pt will report ability to walk for >/= 500 ft w/o increased L hip or knee pain by 12/26/15   Status Achieved  slight L calf pain following ambulation of 550 ft without AD.      PT LONG TERM GOAL #4   Title Increase Berg to >/= 48/56 to decrease fall risk by 12/26/15   Status On-going  11.16.17: 47/56 score      PT LONG TERM GOAL #5   Title Increase FGA to >/= 20/30 to improve gait safety and stability by 12/26/15   Status On-going  11.16.17: score of 17/30.                 Plan - 12/16/15 0937    Clinical Impression Statement Initiated HEP review as pt nearing end of current POC and able to progress resistance to blue TB with seated exercises as well as add yellow TB to standing 3 way SLR. Will plan for review/update of OTAGO at next visit and then determine readiness for D/C vs need for recert.   PT Treatment/Interventions Patient/family education;Therapeutic exercise;Manual techniques;Passive range of motion;Dry needling;Taping;Neuromuscular re-education;Balance training;Therapeutic activities;Functional mobility training;Gait training;Electrical Stimulation;Cryotherapy;Moist Heat;Iontophoresis 58m/ml Dexamethasone;ADLs/Self Care Home Management   PT Next Visit Plan Review floor transfers prn; Continue reviewing & progressing [facility] Homesas appropriate; Hip/knee strengthening; Balance and dynamic gait activities; Modalities as indicated including Ionto patch #2 of 6   Consulted and Agree with Plan of Care Patient      Patient will benefit from skilled therapeutic intervention in order to improve the following deficits and impairments:  Pain, Decreased strength, Decreased range of motion, Impaired flexibility, Decreased mobility, Decreased activity tolerance, Difficulty walking  Visit Diagnosis: Acute pain of left knee  Pain in left hip  Muscle weakness (generalized)  Difficulty in walking, not elsewhere classified  Unsteadiness on feet     Problem List Patient Active Problem List   Diagnosis Date Noted  . Osteoporosis 12/01/2015  . Gout 04/24/2015  . Medicare annual wellness visit, subsequent 10/31/2012  . Hyperlipidemia 12/06/2011  . CAD (coronary artery disease) 04/03/2011  . Carotid stenosis 04/03/2011  . HTN (hypertension) 04/03/2011  . Hypothyroidism 04/03/2011  . Hyperglycemia 04/03/2011    JPercival Spanish PT, MPT 12/16/2015, 12:00 PM  [facility] [place] [address]Phone: [phone]  Fax:  [phone] Name: RCyncere SontagMRN: [id number] of Birth: [birth year]

## 2015-12-23 ENCOUNTER — Ambulatory Visit: Payer: Medicare Other | Admitting: Physical Therapy

## 2015-12-25 ENCOUNTER — Ambulatory Visit: Payer: Medicare Other | Admitting: Physical Therapy

## 2015-12-25 ENCOUNTER — Telehealth: Payer: Self-pay | Admitting: Family Medicine

## 2015-12-25 DIAGNOSIS — M25552 Pain in left hip: Secondary | ICD-10-CM

## 2015-12-25 DIAGNOSIS — R2681 Unsteadiness on feet: Secondary | ICD-10-CM

## 2015-12-25 DIAGNOSIS — R262 Difficulty in walking, not elsewhere classified: Secondary | ICD-10-CM | POA: Diagnosis not present

## 2015-12-25 DIAGNOSIS — M6281 Muscle weakness (generalized): Secondary | ICD-10-CM | POA: Diagnosis not present

## 2015-12-25 DIAGNOSIS — M25562 Pain in left knee: Secondary | ICD-10-CM

## 2015-12-25 NOTE — Telephone Encounter (Signed)
As long as no fevers or bloody stool he can take one or two doses of Imodium daily to help manage the diarrhea no concerns with his meds

## 2015-12-25 NOTE — Therapy (Addendum)
[facility] [address]  [place], [place], [address] Phone: [phone]   Fax:  [phone]  Physical Therapy Treatment  Patient Details  Name: Fernando Liu MRN: [id number] Date of Birth: 12-20-1926 Referring Provider: Brunetta Jeans, PA-C  Encounter Date: 12/25/2015      PT End of Session - 12/25/15 0934    Visit Number 15   Number of Visits 16   Date for PT Re-Evaluation 12/26/15   Authorization Type Medicare/AARP   PT Start Time 0934   PT Stop Time 1016   PT Time Calculation (min) 42 min   Activity Tolerance Patient tolerated treatment well   Behavior During Therapy [facility] for tasks assessed/performed      Past Medical History:  Diagnosis Date  . CAD (coronary artery disease)   . Chicken pox 82 yrs old  . Gout 04/24/2015  . High cholesterol   . Hypertension   . Measles as a child  . Medicare annual wellness visit, subsequent 10/31/2012   Living will wife and daughter have a copy, does not want heroic measures Sees cardiology his recent cardiologist is retiring his new doctor will be at [employer]   . Osteoporosis 12/01/2015  . Preventative health care 10/31/2012  . Thyroid disease     Past Surgical History:  Procedure Laterality Date  . CATARACT EXTRACTION, BILATERAL     August & November 2012  . HERNIA REPAIR    . PELVIC ABCESS DRAINAGE     states surgery affected control of BM  . TONSILLECTOMY      There were no vitals filed for this visit.      Subjective Assessment - 12/25/15 0934    Subjective Pt missed last appt due to "dribbling diarrhea" which is still somewhat of an issue today. Pt would like to try continuing on his own with his HEP, but remain on hold for 30 days in case issues arise.   Patient Stated Goals "get rid of the pain"   Currently in Pain? No/denies            [facility] PT Assessment - 12/25/15 0934      Assessment   Medical Diagnosis L hip & knee pain   Referring  Provider Brunetta Jeans, PA-C     Observation/Other Assessments   Focus on Therapeutic Outcomes (FOTO)  Hip 43% (57% limitation)     Strength   Overall Strength Comments MMT completed in seated position   Right Hip Flexion 4+/5   Right Hip Extension 4+/5   Right Hip ABduction 4+/5   Right Hip ADduction 4+/5   Left Hip Flexion 4/5   Left Hip Extension 4+/5   Left Hip ABduction 4+/5   Left Hip ADduction 4+/5   Right Knee Flexion 4+/5   Right Knee Extension 5/5   Left Knee Flexion 4+/5   Left Knee Extension 5/5     Berg Balance Test   Sit to Stand Able to stand without using hands and stabilize independently   Standing Unsupported Able to stand safely 2 minutes   Sitting with Back Unsupported but Feet Supported on Floor or Stool Able to sit safely and securely 2 minutes   Stand to Sit Sits safely with minimal use of hands   Transfers Able to transfer safely, minor use of hands   Standing Unsupported with Eyes Closed Able to stand 10 seconds safely   Standing Ubsupported with Feet Together Able to place feet together independently and stand  1 minute safely   From Standing, Reach Forward with Outstretched Arm Can reach forward >12 cm safely (5")   From Standing Position, Pick up Object from Floor Able to pick up shoe, needs supervision   From Standing Position, Turn to Look Behind Over each Shoulder Looks behind one side only/other side shows less weight shift   Turn 360 Degrees Able to turn 360 degrees safely in 4 seconds or less   Standing Unsupported, Alternately Place Feet on Step/Stool Able to stand independently and safely and complete 8 steps in 20 seconds   Standing Unsupported, One Foot in Front Able to plae foot ahead of the other independently and hold 30 seconds   Standing on One Leg Tries to lift leg/unable to hold 3 seconds but remains standing independently   Total Score 49     Functional Gait  Assessment   Gait Level Surface Walks 20 ft in less than 5.5 sec, no  assistive devices, good speed, no evidence for imbalance, normal gait pattern, deviates no more than 6 in outside of the 12 in walkway width.   Change in Gait Speed Able to smoothly change walking speed without loss of balance or gait deviation. Deviate no more than 6 in outside of the 12 in walkway width.   Gait with Horizontal Head Turns Performs head turns smoothly with slight change in gait velocity (eg, minor disruption to smooth gait path), deviates 6-10 in outside 12 in walkway width, or uses an assistive device.   Gait with Vertical Head Turns Performs task with slight change in gait velocity (eg, minor disruption to smooth gait path), deviates 6 - 10 in outside 12 in walkway width or uses assistive device   Gait and Pivot Turn Pivot turns safely within 3 sec and stops quickly with no loss of balance.   Step Over Obstacle Is able to step over one shoe box (4.5 in total height) without changing gait speed. No evidence of imbalance.   Gait with Narrow Base of Support Ambulates 4-7 steps.   Gait with Eyes Closed Walks 20 ft, uses assistive device, slower speed, mild gait deviations, deviates 6-10 in outside 12 in walkway width. Ambulates 20 ft in less than 9 sec but greater than 7 sec.   Ambulating Backwards Walks 20 ft, uses assistive device, slower speed, mild gait deviations, deviates 6-10 in outside 12 in walkway width.   Steps Alternating feet, must use rail.   Total Score 22                          Balance Exercises - 12/25/15 0934      OTAGO PROGRAM   Ankle Plantorflexors 20 reps, no support   Ankle Dorsiflexors 20 reps, no support   Knee Bends 20 reps, no support   Backwards Walking No support   Walking and Turning Around No assistive device   Tandem Stance 10 seconds, support  working on weaning from support   Tandem Walk Support   One Leg Stand 10 seconds, support  working on weaning from support   [employer] Support  working on weaning from support    [employer] Support  working on weaning from support   [employer] Backward --  Level C - support   Sit to Stand 10 reps, no support             PT Short Term Goals - 11/13/15 1015      PT SHORT  TERM GOAL #1   Title Independent with initial HEP by 11/21/15   Status Achieved     PT SHORT TERM GOAL #2   Title Complete formal balance assessment as indicated by 11/21/15   Status Achieved           PT Long Term Goals - 01/13/16 0944      PT LONG TERM GOAL #1   Title Independent with advanced HEP as indicated by 12/26/15   Status Achieved     PT LONG TERM GOAL #2   Title B hip and knee strength >/= 4/5 for improved function by 12/26/15   Status Achieved     PT LONG TERM GOAL #3   Title Pt will report ability to walk for >/= 500 ft w/o increased L hip or knee pain by 12/26/15   Status Achieved     PT LONG TERM GOAL #4   Title Increase Berg to >/= 48/56 to decrease fall risk by 12/26/15   Status Achieved  Berg = 49/56     PT LONG TERM GOAL #5   Title Increase FGA to >/= 20/30 to improve gait safety and stability by 12/26/15   Status Achieved  FGA = 22/30               Plan - 01-13-16 1016    Clinical Impression Statement Pt has demonstrated good progress with PT with no recent hip or knee pain reported, Improved LE strength to 4+/5 or greater with exception of L hip flexion at 4/5, and improved walking tolerance. Pt has also demonstrated significant improvement in static and dynamic balance with Merrilee Jansky now 49/56 and FGA now 22/30, both of which indicate a reduced fall risk. All goals met for this episode and pt ready to transition to HEP but would like to remain on hold for 30 days in the event that issues arise with HEP.   PT Treatment/Interventions Patient/family education;Therapeutic exercise;Manual techniques;Passive range of motion;Dry needling;Taping;Neuromuscular re-education;Balance training;Therapeutic activities;Functional mobility training;Gait  training;Electrical Stimulation;Cryotherapy;Moist Heat;Iontophoresis '4mg'$ /ml Dexamethasone;ADLs/Self Care Home Management   PT Next Visit Plan 30 day hold   Consulted and Agree with Plan of Care Patient      Patient will benefit from skilled therapeutic intervention in order to improve the following deficits and impairments:  Pain, Decreased strength, Decreased range of motion, Impaired flexibility, Decreased mobility, Decreased activity tolerance, Difficulty walking  Visit Diagnosis: Acute pain of left knee  Pain in left hip  Muscle weakness (generalized)  Difficulty in walking, not elsewhere classified  Unsteadiness on feet       G-Codes - 01-13-2016 1016    Functional Assessment Tool Used Hip FOTO = 43% (57% limitation)   Functional Limitation Mobility: Walking and moving around   Mobility: Walking and Moving Around Goal Status [phone]) At least 40 percent but less than 60 percent impaired, limited or restricted   Mobility: Walking and Moving Around Discharge Status [id number]) At least 40 percent but less than 60 percent impaired, limited or restricted      Problem List Patient Active Problem List   Diagnosis Date Noted  . Osteoporosis 12/01/2015  . Gout 04/24/2015  . Medicare annual wellness visit, subsequent 10/31/2012  . Hyperlipidemia 12/06/2011  . CAD (coronary artery disease) 04/03/2011  . Carotid stenosis 04/03/2011  . HTN (hypertension) 04/03/2011  . Hypothyroidism 04/03/2011  . Hyperglycemia 04/03/2011    Percival Spanish, PT, MPT Jan 13, 2016, 1:23 PM  [facility] [address]  [address]  [place], [place], [address] Phone: [phone]   Fax:  [phone]  Name: Kaydenn Mclear MRN: [id number] Date of Birth: 06-05-1926   PHYSICAL THERAPY DISCHARGE SUMMARY  Visits from Start of Care: 15  Current functional level related to goals / functional outcomes:    Refer to above clinical impression. Pt has not  needed to return in >30 days, therefore will proceed with discharge.   Remaining deficits:    As above.   Education / Equipment:    HEPs  Plan: Patient agrees to discharge.  Patient goals were met. Patient is being discharged due to meeting the stated rehab goals.  ?????    Percival Spanish, PT, MPT 01/27/16, 10:59 AM  [facility] [place], [place], [address] Phone: [phone]   Fax:  [phone]

## 2015-12-25 NOTE — Telephone Encounter (Signed)
Patient has had for about 4 days some slight diarrhea. Is it ok for him to take something OTC to help??suggestions please, he was concerned to not have any interactions with any meds he may be taking.

## 2015-12-25 NOTE — Telephone Encounter (Signed)
Patient informed of PCP instructions. 

## 2015-12-28 ENCOUNTER — Encounter: Payer: Self-pay | Admitting: Family Medicine

## 2015-12-28 DIAGNOSIS — D649 Anemia, unspecified: Secondary | ICD-10-CM

## 2015-12-28 HISTORY — DX: Anemia, unspecified: D64.9

## 2015-12-28 NOTE — Assessment & Plan Note (Signed)
Tried Fosamax but anemia worsened and patient found to FOB positive. D/c Fosamax and monitor. Encouraged to get adequate exercise, calcium and vitamin d intake

## 2015-12-28 NOTE — Assessment & Plan Note (Signed)
Long standing and multifactorial but worsened with Fosamax and FOB positive. Stop Fosamax and with iron supplement improving. Will continue to monitor

## 2015-12-28 NOTE — Progress Notes (Signed)
Patient ID: Fernando Liu, male   DOB: [birth year], 82 y.o.   MRN: [id number]   Subjective:    Patient ID: Fernando Liu, male    DOB: 1926/08/30, 82 y.o.   MRN: [id number]  Chief Complaint  Patient presents with  . Follow-up    HPI Patient is in today for follow up. He is feeling well and notes long historyof anemia. Denies any bloody or tarry stool. No abdominal pain, nausea or vomtiting. Is tolerating iron supplement. Has needed Colace once daily. Left hip pain improved but still occurs occasionally. Denies CP/palp/SOB/HA/congestion/fevers or GU c/o. Taking meds as prescribed  Past Medical History:  Diagnosis Date  . Anemia 12/28/2015  . CAD (coronary artery disease)   . Chicken pox 82 yrs old  . Gout 04/24/2015  . High cholesterol   . Hypertension   . Measles as a child  . Medicare annual wellness visit, subsequent 10/31/2012   Living will wife and daughter have a copy, does not want heroic measures Sees cardiology his recent cardiologist is retiring his new doctor will be at [employer]   . Osteoporosis 12/01/2015  . Preventative health care 10/31/2012  . Thyroid disease     Past Surgical History:  Procedure Laterality Date  . CATARACT EXTRACTION, BILATERAL     August & November 2012  . HERNIA REPAIR    . PELVIC ABCESS DRAINAGE     states surgery affected control of BM  . TONSILLECTOMY      Family History  Problem Relation Age of Onset  . Stroke Mother   . Hypertension Mother   . Breast cancer Neg Hx   . Diabetes Neg Hx   . Colon cancer Neg Hx   . Prostate cancer Neg Hx   . Cancer Maternal Grandmother   . Heart disease Father   . Cancer Daughter     Social History   Social History  . Marital status: Married    Spouse name: N/A  . Number of children: N/A  . Years of education: N/A   Occupational History  . Not on file.   Social History Main Topics  . Smoking status: Never Smoker  . Smokeless tobacco: Never Used  . Alcohol use 1.2 oz/week    2 Shots of liquor  per week     Comment: daily  . Drug use: No  . Sexual activity: Not on file   Other Topics Concern  . Not on file   Social History Narrative  . No narrative on file    Outpatient Medications Prior to Visit  Medication Sig Dispense Refill  . allopurinol (ZYLOPRIM) 100 MG tablet TAKE 1 TABLET (100 MG TOTAL) BY MOUTH DAILY. 30 tablet 6  . atorvastatin (LIPITOR) 10 MG tablet TAKE 1 TABLET BY MOUTH AT BEDTIME 30 tablet 4  . clopidogrel (PLAVIX) 75 MG tablet Take 75 mg by mouth daily.     Marland Kitchen levothyroxine (SYNTHROID, LEVOTHROID) 75 MCG tablet TAKE 1 TABLET BY MOUTH EVERY DAY 30 tablet 4  . metoprolol tartrate (LOPRESSOR) 25 MG tablet Take 0.5 tablets (12.5 mg total) by mouth 2 (two) times daily. Take 1/2 a tablet twice a day 30 tablet 4  . MICRONIZED COLESTIPOL HCL 1 G tablet Take 1 g by mouth 2 (two) times daily.  2  . nitroGLYCERIN (NITROSTAT) 0.4 MG SL tablet Place 0.4 mg under the tongue every 5 (five) minutes as needed. For chest pain    . alendronate (FOSAMAX) 70 MG tablet Take 1 tablet (70 mg total)  by mouth once a week. Take with a full glass of water on an empty stomach. 4 tablet 4  . Ferrous Fumarate (HEMOCYTE) 324 (106 Fe) MG TABS tablet Take 1 tablet (106 mg of iron total) by mouth daily. 30 tablet 2  . ranitidine (ZANTAC) 150 MG capsule Take 1 capsule (150 mg total) by mouth every evening. 30 capsule 2   No facility-administered medications prior to visit.     Allergies  Allergen Reactions  . Aspirin Swelling    Review of Systems  Constitutional: Negative for fever and malaise/fatigue.  HENT: Negative for congestion.   Eyes: Negative for blurred vision.  Respiratory: Negative for shortness of breath.   Cardiovascular: Negative for chest pain, palpitations and leg swelling.  Gastrointestinal: Positive for constipation. Negative for abdominal pain, blood in stool and nausea.  Genitourinary: Negative for dysuria and frequency.  Musculoskeletal: Positive for joint pain.  Negative for falls.  Skin: Negative for rash.  Neurological: Negative for dizziness, loss of consciousness and headaches.  Endo/Heme/Allergies: Negative for environmental allergies.  Psychiatric/Behavioral: Negative for depression. The patient is not nervous/anxious.        Objective:    Physical Exam  Constitutional: He is oriented to person, place, and time. He appears well-developed and well-nourished. No distress.  HENT:  Head: Normocephalic and atraumatic.  Nose: Nose normal.  Eyes: Right eye exhibits no discharge. Left eye exhibits no discharge.  Neck: Normal range of motion. Neck supple.  Cardiovascular: Normal rate and regular rhythm.   Pulmonary/Chest: Effort normal and breath sounds normal.  Abdominal: Soft. Bowel sounds are normal. There is no tenderness.  Musculoskeletal: He exhibits no edema.  Neurological: He is alert and oriented to person, place, and time.  Skin: Skin is warm and dry.  Psychiatric: He has a normal mood and affect.  Nursing note and vitals reviewed.   BP (!) 112/52 (BP Location: Left Arm, Patient Position: Sitting, Cuff Size: Normal)   Pulse 71   Temp 97.8 F (36.6 C) (Oral)   Wt 166 lb 6.4 oz (75.5 kg)   SpO2 98%   BMI 23.88 kg/m  Wt Readings from Last 3 Encounters:  12/12/15 166 lb 6.4 oz (75.5 kg)  12/01/15 163 lb 12.8 oz (74.3 kg)  10/21/15 166 lb 2 oz (75.4 kg)     Lab Results  Component Value Date   WBC 8.1 12/09/2015   HGB 11.7 (L) 12/09/2015   HCT 35.3 (L) 12/09/2015   PLT 198.0 12/09/2015   GLUCOSE 103 (H) 12/01/2015   CHOL 91 10/21/2015   TRIG 74.0 10/21/2015   HDL 44.30 10/21/2015   LDLCALC 32 10/21/2015   ALT 25 12/01/2015   AST 29 12/01/2015   NA 141 12/01/2015   K 4.6 12/01/2015   CL 110 12/01/2015   CREATININE 1.48 12/01/2015   BUN 24 (H) 12/01/2015   CO2 25 12/01/2015   TSH 2.23 10/21/2015   PSA 0.62 04/30/2013   HGBA1C 4.9 10/21/2015    Lab Results  Component Value Date   TSH 2.23 10/21/2015   Lab  Results  Component Value Date   WBC 8.1 12/09/2015   HGB 11.7 (L) 12/09/2015   HCT 35.3 (L) 12/09/2015   MCV 99.1 12/09/2015   PLT 198.0 12/09/2015   Lab Results  Component Value Date   NA 141 12/01/2015   K 4.6 12/01/2015   CO2 25 12/01/2015   GLUCOSE 103 (H) 12/01/2015   BUN 24 (H) 12/01/2015   CREATININE 1.48 12/01/2015   BILITOT  0.5 12/01/2015   ALKPHOS 138 (H) 12/01/2015   AST 29 12/01/2015   ALT 25 12/01/2015   PROT 5.8 (L) 12/01/2015   ALBUMIN 3.2 (L) 12/01/2015   CALCIUM 8.9 12/01/2015   GFR 47.71 (L) 12/01/2015   Lab Results  Component Value Date   CHOL 91 10/21/2015   Lab Results  Component Value Date   HDL 44.30 10/21/2015   Lab Results  Component Value Date   LDLCALC 32 10/21/2015   Lab Results  Component Value Date   TRIG 74.0 10/21/2015   Lab Results  Component Value Date   CHOLHDL 2 10/21/2015   Lab Results  Component Value Date   HGBA1C 4.9 10/21/2015       Assessment & Plan:   Problem List Items Addressed This Visit    HTN (hypertension)    Well controlled, no changes to meds. Encouraged heart healthy diet such as the DASH diet and exercise as tolerated.       Hyperlipidemia    Tolerating statin, encouraged heart healthy diet, avoid trans fats, minimize simple carbs and saturated fats. Increase exercise as tolerated      Osteoporosis    Tried Fosamax but anemia worsened and patient found to FOB positive. D/c Fosamax and monitor. Encouraged to get adequate exercise, calcium and vitamin d intake      Anemia    Long standing and multifactorial but worsened with Fosamax and FOB positive. Stop Fosamax and with iron supplement improving. Will continue to monitor      Relevant Medications   Ferrous Fumarate (HEMOCYTE) 324 (106 Fe) MG TABS tablet    Other Visit Diagnoses    Rectal bleeding    -  Primary   Relevant Medications   hydrocortisone (ANUSOL-HC) 25 MG suppository      I have discontinued Mr. Garant's alendronate. I have  also changed his Ferrous Fumarate and ranitidine. Additionally, I am having him start on hydrocortisone. Lastly, I am having him maintain his clopidogrel, nitroGLYCERIN, MICRONIZED COLESTIPOL HCL, allopurinol, levothyroxine, atorvastatin, and metoprolol tartrate.  Meds ordered this encounter  Medications  . hydrocortisone (ANUSOL-HC) 25 MG suppository    Sig: Place 1 suppository (25 mg total) rectally at bedtime as needed for hemorrhoids or itching.    Dispense:  12 suppository    Refill:  0  . Ferrous Fumarate (HEMOCYTE) 324 (106 Fe) MG TABS tablet    Sig: Take 1 tablet (106 mg of iron total) by mouth daily. Iron supplement for anemia    Dispense:  30 tablet    Refill:  2  . ranitidine (ZANTAC) 150 MG capsule    Sig: Take 1 capsule (150 mg total) by mouth every evening. For acid protection    Dispense:  30 capsule    Refill:  2     Penni Homans, MD

## 2015-12-28 NOTE — Assessment & Plan Note (Signed)
Well controlled, no changes to meds. Encouraged heart healthy diet such as the DASH diet and exercise as tolerated.  °

## 2015-12-28 NOTE — Assessment & Plan Note (Signed)
Tolerating statin, encouraged heart healthy diet, avoid trans fats, minimize simple carbs and saturated fats. Increase exercise as tolerated 

## 2016-01-16 DIAGNOSIS — S90425A Blister (nonthermal), left lesser toe(s), initial encounter: Secondary | ICD-10-CM | POA: Diagnosis not present

## 2016-01-16 DIAGNOSIS — M79672 Pain in left foot: Secondary | ICD-10-CM | POA: Diagnosis not present

## 2016-01-16 DIAGNOSIS — S90422A Blister (nonthermal), left great toe, initial encounter: Secondary | ICD-10-CM | POA: Diagnosis not present

## 2016-01-16 DIAGNOSIS — S99922A Unspecified injury of left foot, initial encounter: Secondary | ICD-10-CM | POA: Diagnosis not present

## 2016-01-23 ENCOUNTER — Emergency Department (HOSPITAL_BASED_OUTPATIENT_CLINIC_OR_DEPARTMENT_OTHER)
Admission: EM | Admit: 2016-01-23 | Discharge: 2016-01-23 | Disposition: A | Discharge status: Home / Self Care | Payer: Medicare Other | Attending: Emergency Medicine | Admitting: Emergency Medicine

## 2016-01-23 ENCOUNTER — Encounter (HOSPITAL_BASED_OUTPATIENT_CLINIC_OR_DEPARTMENT_OTHER): Payer: Self-pay | Admitting: *Deleted

## 2016-01-23 DIAGNOSIS — Y939 Activity, unspecified: Secondary | ICD-10-CM | POA: Diagnosis not present

## 2016-01-23 DIAGNOSIS — Y929 Unspecified place or not applicable: Secondary | ICD-10-CM | POA: Insufficient documentation

## 2016-01-23 DIAGNOSIS — S99921A Unspecified injury of right foot, initial encounter: Secondary | ICD-10-CM | POA: Diagnosis present

## 2016-01-23 DIAGNOSIS — E039 Hypothyroidism, unspecified: Secondary | ICD-10-CM | POA: Diagnosis not present

## 2016-01-23 DIAGNOSIS — I10 Essential (primary) hypertension: Secondary | ICD-10-CM | POA: Insufficient documentation

## 2016-01-23 DIAGNOSIS — S90421A Blister (nonthermal), right great toe, initial encounter: Secondary | ICD-10-CM | POA: Diagnosis not present

## 2016-01-23 DIAGNOSIS — Y999 Unspecified external cause status: Secondary | ICD-10-CM | POA: Diagnosis not present

## 2016-01-23 DIAGNOSIS — X58XXXA Exposure to other specified factors, initial encounter: Secondary | ICD-10-CM | POA: Diagnosis not present

## 2016-01-23 DIAGNOSIS — S90821A Blister (nonthermal), right foot, initial encounter: Secondary | ICD-10-CM | POA: Diagnosis not present

## 2016-01-23 DIAGNOSIS — T148XXA Other injury of unspecified body region, initial encounter: Secondary | ICD-10-CM

## 2016-01-23 NOTE — ED Triage Notes (Signed)
Here for injury of his right toe. States it had a blood blister that he thinks burst. Last week he was seen at [facility] for injury of his left toe that had a blood blister that burst.

## 2016-01-23 NOTE — ED Provider Notes (Signed)
[facility] Provider Note   CSN: [id number] Arrival date & time: 01/23/16  1310  By signing my name below, I, Fernando Liu, attest that this documentation has been prepared under the direction and in the presence of [name], MD. Electronically Signed: [name], Education administrator. 01/23/16. 2:05 PM.  History   Chief Complaint Chief Complaint  Patient presents with  . Toe Injury   The history is provided by the patient and the spouse. No language interpreter was used.     HPI Comments: Fernando Liu is a 82 y.o. male who presents to the Emergency Department complaining of a right toe injury that occurred several days ago. Patient states he stubbed the affected area and now has a blood blister with associated bruising to the right great toe. He reports a small amount of drainage to the affected area. He had a similar occurrence to the left great toe last week and states it healed much quicker than the current area to the right side. He takes Plavix daily. He denies associated pain or any other issues at this time.   Past Medical History:  Diagnosis Date  . Anemia 12/28/2015  . CAD (coronary artery disease)   . Chicken pox 82 yrs old  . Gout 04/24/2015  . High cholesterol   . Hypertension   . Measles as a child  . Medicare annual wellness visit, subsequent 10/31/2012   Living will wife and daughter have a copy, does not want heroic measures Sees cardiology his recent cardiologist is retiring his new doctor will be at [employer]   . Osteoporosis 12/01/2015  . Preventative health care 10/31/2012  . Thyroid disease     Patient Active Problem List   Diagnosis Date Noted  . Anemia 12/28/2015  . Osteoporosis 12/01/2015  . Gout 04/24/2015  . Medicare annual wellness visit, subsequent 10/31/2012  . Hyperlipidemia 12/06/2011  . CAD (coronary artery disease) 04/03/2011  . Carotid stenosis 04/03/2011  . HTN (hypertension) 04/03/2011  . Hypothyroidism 04/03/2011  . Hyperglycemia  04/03/2011    Past Surgical History:  Procedure Laterality Date  . CATARACT EXTRACTION, BILATERAL     August & November 2012  . HERNIA REPAIR    . PELVIC ABCESS DRAINAGE     states surgery affected control of BM  . TONSILLECTOMY         Home Medications    Prior to Admission medications   Medication Sig Start Date End Date Taking? Authorizing Provider  b complex vitamins tablet Take 1 tablet by mouth daily.   Yes Historical Provider, MD  IRON PO Take by mouth.   Yes Historical Provider, MD  allopurinol (ZYLOPRIM) 100 MG tablet TAKE 1 TABLET (100 MG TOTAL) BY MOUTH DAILY. 11/11/15   Mosie Lukes, MD  atorvastatin (LIPITOR) 10 MG tablet TAKE 1 TABLET BY MOUTH AT BEDTIME 11/23/15   Mosie Lukes, MD  clopidogrel (PLAVIX) 75 MG tablet Take 75 mg by mouth daily.     Historical Provider, MD  Ferrous Fumarate (HEMOCYTE) 324 (106 Fe) MG TABS tablet Take 1 tablet (106 mg of iron total) by mouth daily. Iron supplement for anemia 12/12/15   Mosie Lukes, MD  hydrocortisone (ANUSOL-HC) 25 MG suppository Place 1 suppository (25 mg total) rectally at bedtime as needed for hemorrhoids or itching. 12/12/15   Mosie Lukes, MD  levothyroxine (SYNTHROID, LEVOTHROID) 75 MCG tablet TAKE 1 TABLET BY MOUTH EVERY DAY 11/19/15   Mosie Lukes, MD  metoprolol tartrate (LOPRESSOR) 25 MG tablet  Take 0.5 tablets (12.5 mg total) by mouth 2 (two) times daily. Take 1/2 a tablet twice a day 12/01/15   Mosie Lukes, MD  MICRONIZED COLESTIPOL HCL 1 G tablet Take 1 g by mouth 2 (two) times daily. 04/23/14   Historical Provider, MD  nitroGLYCERIN (NITROSTAT) 0.4 MG SL tablet Place 0.4 mg under the tongue every 5 (five) minutes as needed. For chest pain    Historical Provider, MD  ranitidine (ZANTAC) 150 MG capsule Take 1 capsule (150 mg total) by mouth every evening. For acid protection 12/12/15   Mosie Lukes, MD    Family History Family History  Problem Relation Age of Onset  . Stroke Mother   .  Hypertension Mother   . Cancer Maternal Grandmother   . Heart disease Father   . Cancer Daughter   . Breast cancer Neg Hx   . Diabetes Neg Hx   . Colon cancer Neg Hx   . Prostate cancer Neg Hx     Social History Social History  Substance Use Topics  . Smoking status: Never Smoker  . Smokeless tobacco: Never Used  . Alcohol use 1.2 oz/week    2 Shots of liquor per week     Comment: daily     Allergies   Aspirin   Review of Systems Review of Systems  A complete 10 system review of systems was obtained and all systems are negative except as noted in the HPI and PMH.   Physical Exam Updated Vital Signs BP 124/57 (BP Location: Left Arm)   Pulse 72   Temp 97.6 F (36.4 C) (Oral)   Resp 18   Ht 5\' 10"  (1.778 m)   Wt 165 lb (74.8 kg)   SpO2 100%   BMI 23.68 kg/m   Physical Exam  Constitutional: He is oriented to person, place, and time. He appears well-developed and well-nourished.  HENT:  Head: Normocephalic and atraumatic.  Eyes: EOM are normal.  Neck: Normal range of motion.  Cardiovascular: Normal rate, regular rhythm, normal heart sounds and intact distal pulses.   Pulmonary/Chest: Effort normal and breath sounds normal. No respiratory distress.  Abdominal: Soft. He exhibits no distension. There is no tenderness.  Musculoskeletal: Normal range of motion.  Right great toe: Hemorrhagic blister over dorsum of right big toe. Small amount of spontaneous drainage. Mild surrounding ecchymosis. Doesn't appear infected. No significant tenderness. Able to fully range the toes  Neurological: He is alert and oriented to person, place, and time.  Skin: Skin is warm and dry.  Psychiatric: He has a normal mood and affect. Judgment normal.  Nursing note and vitals reviewed.    ED Treatments / Results  DIAGNOSTIC STUDIES: Oxygen Saturation is 100% on RA, normal by my interpretation.    COORDINATION OF CARE: 1:42 PM Discussed treatment plan with pt at bedside and pt agreed  to plan.   Labs (all labs ordered are listed, but only abnormal results are displayed) Labs Reviewed - No data to display  EKG  EKG Interpretation None       Radiology No results found.  Procedures Procedures (including critical care time)  Medications Ordered in ED Medications - No data to display   Initial Impression / Assessment and Plan / ED Course  I have reviewed the triage vital signs and the nursing notes.  Pertinent labs & imaging results that were available during my care of the patient were reviewed by me and considered in my medical decision making (see chart for  details).  Clinical Course     82 year old male with a large hemorrhagic blister to his right great toe. There is already a small amount of drainage which is a nuisance to him. Small incision was made to fully drain this collection. Dressed. Continued wound care and return precautions were discussed. There are no signs of a surrounding cellulitis.  Final Clinical Impressions(s) / ED Diagnoses   Final diagnoses:  Blood blister    New Prescriptions New Prescriptions   No medications on file   I personally preformed the services scribed in my presence. The recorded information has been reviewed is accurate. [name], MD.    [name], MD 01/29/16 1325

## 2016-02-14 DIAGNOSIS — T148XXA Other injury of unspecified body region, initial encounter: Secondary | ICD-10-CM | POA: Diagnosis not present

## 2016-02-14 DIAGNOSIS — S8002XA Contusion of left knee, initial encounter: Secondary | ICD-10-CM | POA: Diagnosis not present

## 2016-02-14 DIAGNOSIS — Z79899 Other long term (current) drug therapy: Secondary | ICD-10-CM | POA: Diagnosis not present

## 2016-02-14 DIAGNOSIS — Z89429 Acquired absence of other toe(s), unspecified side: Secondary | ICD-10-CM | POA: Diagnosis not present

## 2016-02-14 DIAGNOSIS — E785 Hyperlipidemia, unspecified: Secondary | ICD-10-CM | POA: Diagnosis not present

## 2016-02-14 DIAGNOSIS — M25562 Pain in left knee: Secondary | ICD-10-CM | POA: Diagnosis not present

## 2016-02-14 DIAGNOSIS — I440 Atrioventricular block, first degree: Secondary | ICD-10-CM | POA: Diagnosis not present

## 2016-02-14 DIAGNOSIS — I10 Essential (primary) hypertension: Secondary | ICD-10-CM | POA: Diagnosis not present

## 2016-02-14 DIAGNOSIS — S8992XA Unspecified injury of left lower leg, initial encounter: Secondary | ICD-10-CM | POA: Diagnosis not present

## 2016-02-14 DIAGNOSIS — Z886 Allergy status to analgesic agent status: Secondary | ICD-10-CM | POA: Diagnosis not present

## 2016-02-14 DIAGNOSIS — S80212A Abrasion, left knee, initial encounter: Secondary | ICD-10-CM | POA: Diagnosis not present

## 2016-02-14 DIAGNOSIS — M1712 Unilateral primary osteoarthritis, left knee: Secondary | ICD-10-CM | POA: Diagnosis not present

## 2016-02-27 ENCOUNTER — Ambulatory Visit (INDEPENDENT_AMBULATORY_CARE_PROVIDER_SITE_OTHER): Payer: Medicare Other | Admitting: Family Medicine

## 2016-02-27 ENCOUNTER — Encounter: Payer: Self-pay | Admitting: Family Medicine

## 2016-02-27 VITALS — BP 114/58 | HR 62 | Temp 97.4°F | Wt 167.2 lb

## 2016-02-27 DIAGNOSIS — R739 Hyperglycemia, unspecified: Secondary | ICD-10-CM

## 2016-02-27 DIAGNOSIS — E039 Hypothyroidism, unspecified: Secondary | ICD-10-CM

## 2016-02-27 DIAGNOSIS — I10 Essential (primary) hypertension: Secondary | ICD-10-CM

## 2016-02-27 DIAGNOSIS — E785 Hyperlipidemia, unspecified: Secondary | ICD-10-CM | POA: Diagnosis not present

## 2016-02-27 DIAGNOSIS — M109 Gout, unspecified: Secondary | ICD-10-CM

## 2016-02-27 DIAGNOSIS — M810 Age-related osteoporosis without current pathological fracture: Secondary | ICD-10-CM

## 2016-02-27 NOTE — Progress Notes (Signed)
Pre visit review using our clinic review tool, if applicable. No additional management support is needed unless otherwise documented below in the visit note. 

## 2016-02-27 NOTE — Patient Instructions (Signed)

## 2016-02-27 NOTE — Progress Notes (Signed)
Patient ID: Fernando Liu, male   DOB: February 17, 1926, 83 y.o.   MRN: [id number]   Subjective:    Patient ID: Fernando Liu, male    DOB: 11-21-1926, 83 y.o.   MRN: [id number]  Chief Complaint  Patient presents with  . Follow-up  I acted as a Education administrator for Dr. Charlett Blake. Princess, RMA   HPI   Patient is in today for follow up with his Gout and other medical concerns. He has stubbed both great toes in the past few weeks and was actually seen in ED a couple of times. He slipped on the ice and fell on his sacrum bruising it badly but it is improving. His great toenails have become loose over both toes but he denies redness, warmth, swelling or pain. Denies CP/palp/SOB/HA/congestion/fevers/GI or GU c/o. Taking meds as prescribed  Past Medical History:  Diagnosis Date  . Anemia 12/28/2015  . CAD (coronary artery disease)   . Chicken pox 83 yrs old  . Gout 04/24/2015  . High cholesterol   . Hypertension   . Measles as a child  . Medicare annual wellness visit, subsequent 10/31/2012   Living will wife and daughter have a copy, does not want heroic measures Sees cardiology his recent cardiologist is retiring his new doctor will be at [employer]   . Osteoporosis 12/01/2015  . Preventative health care 10/31/2012  . Thyroid disease     Past Surgical History:  Procedure Laterality Date  . CATARACT EXTRACTION, BILATERAL     August & November 2012  . FOOT SURGERY Left   . HERNIA REPAIR    . PELVIC ABCESS DRAINAGE     states surgery affected control of BM  . TONSILLECTOMY      Family History  Problem Relation Age of Onset  . Stroke Mother   . Hypertension Mother   . Cancer Maternal Grandmother   . Heart disease Father   . Cancer Daughter   . Breast cancer Neg Hx   . Diabetes Neg Hx   . Colon cancer Neg Hx   . Prostate cancer Neg Hx     Social History   Social History  . Marital status: Married    Spouse name: N/A  . Number of children: N/A  . Years of education: N/A   Occupational History    . Not on file.   Social History Main Topics  . Smoking status: Never Smoker  . Smokeless tobacco: Never Used  . Alcohol use 1.2 oz/week    2 Shots of liquor per week     Comment: daily  . Drug use: No  . Sexual activity: Not on file   Other Topics Concern  . Not on file   Social History Narrative  . No narrative on file    Outpatient Medications Prior to Visit  Medication Sig Dispense Refill  . allopurinol (ZYLOPRIM) 100 MG tablet TAKE 1 TABLET (100 MG TOTAL) BY MOUTH DAILY. 30 tablet 6  . atorvastatin (LIPITOR) 10 MG tablet TAKE 1 TABLET BY MOUTH AT BEDTIME 30 tablet 4  . b complex vitamins tablet Take 1 tablet by mouth daily.    . clopidogrel (PLAVIX) 75 MG tablet Take 75 mg by mouth daily.     . Ferrous Fumarate (HEMOCYTE) 324 (106 Fe) MG TABS tablet Take 1 tablet (106 mg of iron total) by mouth daily. Iron supplement for anemia 30 tablet 2  . hydrocortisone (ANUSOL-HC) 25 MG suppository Place 1 suppository (25 mg total) rectally at bedtime as needed for  hemorrhoids or itching. 12 suppository 0  . IRON PO Take by mouth.    . levothyroxine (SYNTHROID, LEVOTHROID) 75 MCG tablet TAKE 1 TABLET BY MOUTH EVERY DAY 30 tablet 4  . metoprolol tartrate (LOPRESSOR) 25 MG tablet Take 0.5 tablets (12.5 mg total) by mouth 2 (two) times daily. Take 1/2 a tablet twice a day 30 tablet 4  . MICRONIZED COLESTIPOL HCL 1 G tablet Take 1 g by mouth 2 (two) times daily.  2  . nitroGLYCERIN (NITROSTAT) 0.4 MG SL tablet Place 0.4 mg under the tongue every 5 (five) minutes as needed. For chest pain    . ranitidine (ZANTAC) 150 MG capsule Take 1 capsule (150 mg total) by mouth every evening. For acid protection 30 capsule 2   No facility-administered medications prior to visit.     Allergies  Allergen Reactions  . Aspirin Swelling    Review of Systems  Constitutional: Negative for fever and malaise/fatigue.  HENT: Negative for congestion.   Eyes: Negative for blurred vision.  Respiratory:  Negative for shortness of breath.   Cardiovascular: Negative for chest pain, palpitations and leg swelling.  Gastrointestinal: Negative for abdominal pain, blood in stool and nausea.  Genitourinary: Negative for dysuria and frequency.  Musculoskeletal: Positive for joint pain. Negative for falls.  Skin: Negative for rash.  Neurological: Negative for dizziness, loss of consciousness and headaches.  Endo/Heme/Allergies: Negative for environmental allergies.  Psychiatric/Behavioral: Negative for depression. The patient is not nervous/anxious.        Objective:    Physical Exam  Constitutional: He is oriented to person, place, and time. He appears well-developed and well-nourished. No distress.  HENT:  Head: Normocephalic and atraumatic.  Nose: Nose normal.  Eyes: Right eye exhibits no discharge. Left eye exhibits no discharge.  Neck: Normal range of motion. Neck supple.  Cardiovascular: Normal rate and regular rhythm.   No murmur heard. Pulmonary/Chest: Effort normal and breath sounds normal.  Abdominal: Soft. Bowel sounds are normal. There is no tenderness.  Musculoskeletal: He exhibits no edema.  Left great toenail white and loose no surrounding erythema or edema in toe, right great toe nail with some bruising underneath and mildly loose, no surrounding erythema  Neurological: He is alert and oriented to person, place, and time.  Skin: Skin is warm and dry.  Psychiatric: He has a normal mood and affect.  Nursing note and vitals reviewed.   BP (!) 114/58 (BP Location: Left Arm, Patient Position: Sitting, Cuff Size: Normal)   Pulse 62   Temp 97.4 F (36.3 C) (Oral)   Wt 167 lb 3.2 oz (75.8 kg)   SpO2 98%   BMI 23.99 kg/m  Wt Readings from Last 3 Encounters:  02/27/16 167 lb 3.2 oz (75.8 kg)  01/23/16 165 lb (74.8 kg)  12/12/15 166 lb 6.4 oz (75.5 kg)     Lab Results  Component Value Date   WBC 8.1 12/09/2015   HGB 11.7 (L) 12/09/2015   HCT 35.3 (L) 12/09/2015   PLT  198.0 12/09/2015   GLUCOSE 103 (H) 12/01/2015   CHOL 91 10/21/2015   TRIG 74.0 10/21/2015   HDL 44.30 10/21/2015   LDLCALC 32 10/21/2015   ALT 25 12/01/2015   AST 29 12/01/2015   NA 141 12/01/2015   K 4.6 12/01/2015   CL 110 12/01/2015   CREATININE 1.48 12/01/2015   BUN 24 (H) 12/01/2015   CO2 25 12/01/2015   TSH 2.23 10/21/2015   PSA 0.62 04/30/2013   HGBA1C 4.9 10/21/2015  Lab Results  Component Value Date   TSH 2.23 10/21/2015   Lab Results  Component Value Date   WBC 8.1 12/09/2015   HGB 11.7 (L) 12/09/2015   HCT 35.3 (L) 12/09/2015   MCV 99.1 12/09/2015   PLT 198.0 12/09/2015   Lab Results  Component Value Date   NA 141 12/01/2015   K 4.6 12/01/2015   CO2 25 12/01/2015   GLUCOSE 103 (H) 12/01/2015   BUN 24 (H) 12/01/2015   CREATININE 1.48 12/01/2015   BILITOT 0.5 12/01/2015   ALKPHOS 138 (H) 12/01/2015   AST 29 12/01/2015   ALT 25 12/01/2015   PROT 5.8 (L) 12/01/2015   ALBUMIN 3.2 (L) 12/01/2015   CALCIUM 8.9 12/01/2015   GFR 47.71 (L) 12/01/2015   Lab Results  Component Value Date   CHOL 91 10/21/2015   Lab Results  Component Value Date   HDL 44.30 10/21/2015   Lab Results  Component Value Date   LDLCALC 32 10/21/2015   Lab Results  Component Value Date   TRIG 74.0 10/21/2015   Lab Results  Component Value Date   CHOLHDL 2 10/21/2015   Lab Results  Component Value Date   HGBA1C 4.9 10/21/2015       Assessment & Plan:   Problem List Items Addressed This Visit    HTN (hypertension)    Well controlled, no changes to meds. Encouraged heart healthy diet such as the DASH diet and exercise as tolerated.       Relevant Orders   CBC   Comprehensive metabolic panel   TSH   Hypothyroidism    On Levothyroxine, continue to monitor      Hyperglycemia    minimize simple carbs. Increase exercise as tolerated.       Relevant Orders   Hemoglobin A1c   Hyperlipidemia - Primary    Tolerating statin, encouraged heart healthy diet,  avoid trans fats, minimize simple carbs and saturated fats. Increase exercise as tolerated      Relevant Orders   Lipid panel   Gout    Stubbed left great toe and fell and injured right great toe all in past 2 months and no gout flare. Encouraged good hydration and check uric acid with next visit. His toenails are slightly loose but he declines referral to podiatry if they loosen further and they start to catch on things then he will call for referral to podiatry      Relevant Orders   Uric acid   Osteoporosis   Relevant Orders   Vitamin D (25 hydroxy)      I am having Mr. Whorton maintain his clopidogrel, nitroGLYCERIN, MICRONIZED COLESTIPOL HCL, allopurinol, levothyroxine, atorvastatin, metoprolol tartrate, hydrocortisone, Ferrous Fumarate, ranitidine, b complex vitamins, and IRON PO.  No orders of the defined types were placed in this encounter.   CMA served as Education administrator during this visit. History, Physical and Plan performed by medical provider. Documentation and orders reviewed and attested to.  Penni Homans, MD

## 2016-02-27 NOTE — Assessment & Plan Note (Signed)
minimize simple carbs. Increase exercise as tolerated.  

## 2016-02-27 NOTE — Assessment & Plan Note (Signed)
Well controlled, no changes to meds. Encouraged heart healthy diet such as the DASH diet and exercise as tolerated.  °

## 2016-02-27 NOTE — Assessment & Plan Note (Addendum)
Stubbed left great toe and fell and injured right great toe all in past 2 months and no gout flare. Encouraged good hydration and check uric acid with next visit. His toenails are slightly loose but he declines referral to podiatry if they loosen further and they start to catch on things then he will call for referral to podiatry

## 2016-02-29 NOTE — Assessment & Plan Note (Signed)
On Levothyroxine, continue to monitor 

## 2016-02-29 NOTE — Assessment & Plan Note (Signed)
Tolerating statin, encouraged heart healthy diet, avoid trans fats, minimize simple carbs and saturated fats. Increase exercise as tolerated 

## 2016-03-23 DIAGNOSIS — Z961 Presence of intraocular lens: Secondary | ICD-10-CM | POA: Diagnosis not present

## 2016-03-23 DIAGNOSIS — H3554 Dystrophies primarily involving the retinal pigment epithelium: Secondary | ICD-10-CM | POA: Diagnosis not present

## 2016-03-23 DIAGNOSIS — H1851 Endothelial corneal dystrophy: Secondary | ICD-10-CM | POA: Diagnosis not present

## 2016-04-20 DIAGNOSIS — K921 Melena: Secondary | ICD-10-CM | POA: Diagnosis not present

## 2016-04-20 DIAGNOSIS — R159 Full incontinence of feces: Secondary | ICD-10-CM | POA: Diagnosis not present

## 2016-05-07 ENCOUNTER — Other Ambulatory Visit (INDEPENDENT_AMBULATORY_CARE_PROVIDER_SITE_OTHER): Payer: Medicare Other

## 2016-05-07 DIAGNOSIS — E785 Hyperlipidemia, unspecified: Secondary | ICD-10-CM | POA: Diagnosis not present

## 2016-05-07 DIAGNOSIS — M109 Gout, unspecified: Secondary | ICD-10-CM

## 2016-05-07 DIAGNOSIS — R739 Hyperglycemia, unspecified: Secondary | ICD-10-CM | POA: Diagnosis not present

## 2016-05-07 DIAGNOSIS — I10 Essential (primary) hypertension: Secondary | ICD-10-CM

## 2016-05-07 DIAGNOSIS — M810 Age-related osteoporosis without current pathological fracture: Secondary | ICD-10-CM | POA: Diagnosis not present

## 2016-05-07 LAB — CBC
HEMATOCRIT: 42.7 % (ref 39.0–52.0)
HEMOGLOBIN: 13.9 g/dL (ref 13.0–17.0)
MCHC: 32.6 g/dL (ref 30.0–36.0)
MCV: 99.7 fl (ref 78.0–100.0)
PLATELETS: 177 10*3/uL (ref 150.0–400.0)
RBC: 4.28 Mil/uL (ref 4.22–5.81)
RDW: 14.8 % (ref 11.5–15.5)
WBC: 7.9 10*3/uL (ref 4.0–10.5)

## 2016-05-07 LAB — LIPID PANEL
Cholesterol: 113 mg/dL (ref 0–200)
HDL: 52.6 mg/dL (ref >39.00)
LDL Cholesterol: 47 mg/dL (ref 0–99)
NONHDL: 59.99
TRIGLYCERIDES: 65 mg/dL (ref 0.0–149.0)
Total CHOL/HDL Ratio: 2
VLDL: 13 mg/dL (ref 0.0–40.0)

## 2016-05-07 LAB — HEMOGLOBIN A1C: HEMOGLOBIN A1C: 5.2 % (ref 4.6–6.5)

## 2016-05-07 LAB — COMPREHENSIVE METABOLIC PANEL
ALBUMIN: 3.3 g/dL — AB (ref 3.5–5.2)
ALT: 21 U/L (ref 0–53)
AST: 29 U/L (ref 0–37)
Alkaline Phosphatase: 169 U/L — ABNORMAL HIGH (ref 39–117)
BUN: 20 mg/dL (ref 6–23)
CHLORIDE: 106 meq/L (ref 96–112)
CO2: 29 mEq/L (ref 19–32)
Calcium: 9.2 mg/dL (ref 8.4–10.5)
Creatinine, Ser: 1.28 mg/dL (ref 0.40–1.50)
GFR: 56.36 mL/min — ABNORMAL LOW (ref >60.00)
GLUCOSE: 101 mg/dL — AB (ref 70–99)
POTASSIUM: 4.1 meq/L (ref 3.5–5.1)
SODIUM: 140 meq/L (ref 135–145)
Total Bilirubin: 0.9 mg/dL (ref 0.2–1.2)
Total Protein: 6.5 g/dL (ref 6.0–8.3)

## 2016-05-07 LAB — URIC ACID: Uric Acid, Serum: 5.7 mg/dL (ref 4.0–7.8)

## 2016-05-07 LAB — TSH: TSH: 6.75 u[IU]/mL — ABNORMAL HIGH (ref 0.35–4.50)

## 2016-05-07 LAB — VITAMIN D 25 HYDROXY (VIT D DEFICIENCY, FRACTURES): VITD: 66.4 ng/mL (ref 30.00–100.00)

## 2016-05-08 ENCOUNTER — Encounter (HOSPITAL_BASED_OUTPATIENT_CLINIC_OR_DEPARTMENT_OTHER): Payer: Self-pay | Admitting: Emergency Medicine

## 2016-05-08 ENCOUNTER — Emergency Department (HOSPITAL_BASED_OUTPATIENT_CLINIC_OR_DEPARTMENT_OTHER)
Admission: EM | Admit: 2016-05-08 | Discharge: 2016-05-08 | Disposition: A | Discharge status: Home / Self Care | Payer: Medicare Other | Attending: Emergency Medicine | Admitting: Emergency Medicine

## 2016-05-08 DIAGNOSIS — E039 Hypothyroidism, unspecified: Secondary | ICD-10-CM | POA: Insufficient documentation

## 2016-05-08 DIAGNOSIS — Z79899 Other long term (current) drug therapy: Secondary | ICD-10-CM | POA: Insufficient documentation

## 2016-05-08 DIAGNOSIS — I2510 Atherosclerotic heart disease of native coronary artery without angina pectoris: Secondary | ICD-10-CM | POA: Diagnosis not present

## 2016-05-08 DIAGNOSIS — M10072 Idiopathic gout, left ankle and foot: Secondary | ICD-10-CM | POA: Insufficient documentation

## 2016-05-08 DIAGNOSIS — M109 Gout, unspecified: Secondary | ICD-10-CM | POA: Diagnosis not present

## 2016-05-08 DIAGNOSIS — I10 Essential (primary) hypertension: Secondary | ICD-10-CM | POA: Diagnosis not present

## 2016-05-08 DIAGNOSIS — M79672 Pain in left foot: Secondary | ICD-10-CM | POA: Diagnosis present

## 2016-05-08 HISTORY — DX: Polyneuropathy, unspecified: G62.9

## 2016-05-08 MED ORDER — PREDNISONE 20 MG PO TABS: ORAL_TABLET | ORAL | 0 refills | Status: DC

## 2016-05-08 NOTE — ED Provider Notes (Signed)
[facility] Provider Note   CSN: [id number] Arrival date & time: 05/08/16  9833     History   Chief Complaint Chief Complaint  Patient presents with  . Foot Pain    HPI Fernando Liu is a 83 y.o. male with a PMHx of anemia, gout, CAD, HTN, HLD, osteoporosis, hypothyroidism, and neuropathy, who presents to the ED with complaints of left great toe MTP joint pain, swelling, erythema, and warmth 2 days. He denies any recent injuries or skin openings. He describes the pain as 3/10 intermittent nonradiating aching at the left first MTP joint, worse with movement and standing/walking, and with no treatments tried prior to arrival. He denies any red streaking, fevers, chills, CP, SOB, abd pain, N/V/D/C, hematuria, dysuria, myalgias, new/changed numbness/tingling, focal weakness, or any other complaints at this time.    The history is provided by the patient and medical records. No language interpreter was used.  Foot Pain  This is a new problem. The current episode started 2 days ago. The problem occurs daily. The problem has not changed since onset.Pertinent negatives include no chest pain, no abdominal pain and no shortness of breath. The symptoms are aggravated by walking and standing. Nothing relieves the symptoms. He has tried nothing for the symptoms. The treatment provided no relief.    Past Medical History:  Diagnosis Date  . Anemia 12/28/2015  . CAD (coronary artery disease)   . Chicken pox 83 yrs old  . Gout 04/24/2015  . High cholesterol   . Hypertension   . Measles as a child  . Medicare annual wellness visit, subsequent 10/31/2012   Living will wife and daughter have a copy, does not want heroic measures Sees cardiology his recent cardiologist is retiring his new doctor will be at [employer]   . Neuropathy   . Osteoporosis 12/01/2015  . Preventative health care 10/31/2012  . Thyroid disease     Patient Active Problem List   Diagnosis Date Noted  . Anemia  12/28/2015  . Osteoporosis 12/01/2015  . Gout 04/24/2015  . Medicare annual wellness visit, subsequent 10/31/2012  . Hyperlipidemia 12/06/2011  . CAD (coronary artery disease) 04/03/2011  . Carotid stenosis 04/03/2011  . HTN (hypertension) 04/03/2011  . Hypothyroidism 04/03/2011  . Hyperglycemia 04/03/2011    Past Surgical History:  Procedure Laterality Date  . CATARACT EXTRACTION, BILATERAL     August & November 2012  . FOOT SURGERY Left   . HERNIA REPAIR    . PELVIC ABCESS DRAINAGE     states surgery affected control of BM  . TONSILLECTOMY         Home Medications    Prior to Admission medications   Medication Sig Start Date End Date Taking? Authorizing Provider  allopurinol (ZYLOPRIM) 100 MG tablet TAKE 1 TABLET (100 MG TOTAL) BY MOUTH DAILY. 11/11/15  Yes Mosie Lukes, MD  atorvastatin (LIPITOR) 10 MG tablet TAKE 1 TABLET BY MOUTH AT BEDTIME 11/23/15  Yes Mosie Lukes, MD  b complex vitamins tablet Take 1 tablet by mouth daily.    Historical Provider, MD  clopidogrel (PLAVIX) 75 MG tablet Take 75 mg by mouth daily.     Historical Provider, MD  Ferrous Fumarate (HEMOCYTE) 324 (106 Fe) MG TABS tablet Take 1 tablet (106 mg of iron total) by mouth daily. Iron supplement for anemia 12/12/15   Mosie Lukes, MD  hydrocortisone (ANUSOL-HC) 25 MG suppository Place 1 suppository (25 mg total) rectally at bedtime as needed for hemorrhoids or itching.  12/12/15   Mosie Lukes, MD  IRON PO Take by mouth.    Historical Provider, MD  levothyroxine (SYNTHROID, LEVOTHROID) 75 MCG tablet TAKE 1 TABLET BY MOUTH EVERY DAY 11/19/15   Mosie Lukes, MD  metoprolol tartrate (LOPRESSOR) 25 MG tablet Take 0.5 tablets (12.5 mg total) by mouth 2 (two) times daily. Take 1/2 a tablet twice a day 12/01/15   Mosie Lukes, MD  MICRONIZED COLESTIPOL HCL 1 G tablet Take 1 g by mouth 2 (two) times daily. 04/23/14   Historical Provider, MD  nitroGLYCERIN (NITROSTAT) 0.4 MG SL tablet Place 0.4 mg  under the tongue every 5 (five) minutes as needed. For chest pain    Historical Provider, MD  ranitidine (ZANTAC) 150 MG capsule Take 1 capsule (150 mg total) by mouth every evening. For acid protection 12/12/15   Mosie Lukes, MD    Family History Family History  Problem Relation Age of Onset  . Stroke Mother   . Hypertension Mother   . Cancer Maternal Grandmother   . Heart disease Father   . Cancer Daughter   . Breast cancer Neg Hx   . Diabetes Neg Hx   . Colon cancer Neg Hx   . Prostate cancer Neg Hx     Social History Social History  Substance Use Topics  . Smoking status: Never Smoker  . Smokeless tobacco: Never Used  . Alcohol use 1.2 oz/week    2 Shots of liquor per week     Comment: daily     Allergies   Aspirin   Review of Systems Review of Systems  Constitutional: Negative for chills and fever.  Respiratory: Negative for shortness of breath.   Cardiovascular: Negative for chest pain.  Gastrointestinal: Negative for abdominal pain, constipation, diarrhea, nausea and vomiting.  Genitourinary: Negative for dysuria and hematuria.  Musculoskeletal: Positive for arthralgias and joint swelling. Negative for myalgias.  Skin: Positive for color change. Negative for wound.  Allergic/Immunologic: Negative for immunocompromised state.  Neurological: Negative for weakness and numbness.  Psychiatric/Behavioral: Negative for confusion.   10 Systems reviewed and are negative for acute change except as noted in the HPI.   Physical Exam Updated Vital Signs BP (!) 142/60 (BP Location: Right Arm)   Pulse (!) 58   Temp 97.6 F (36.4 C) (Oral)   Resp 18   Ht 5\' 10"  (1.778 m)   Wt 72.6 kg   SpO2 99%   BMI 22.96 kg/m   Physical Exam  Constitutional: He is oriented to person, place, and time. Vital signs are normal. He appears well-developed and well-nourished.  Non-toxic appearance. No distress.  Afebrile, nontoxic, NAD  HENT:  Head: Normocephalic and atraumatic.    Mouth/Throat: Mucous membranes are normal.  Eyes: Conjunctivae and EOM are normal. Right eye exhibits no discharge. Left eye exhibits no discharge.  Neck: Normal range of motion. Neck supple.  Cardiovascular: Normal rate and intact distal pulses.   Pulmonary/Chest: Effort normal. No respiratory distress.  Abdominal: Normal appearance. He exhibits no distension.  Musculoskeletal: Normal range of motion.       Left foot: There is tenderness, bony tenderness and swelling. There is normal range of motion, normal capillary refill, no crepitus, no deformity and no laceration.  L foot with mild erythema, warmth, and swelling around the 1st MTP joint, mild TTP, with FROM intact in all toes and at all joints. No red streaking, no skin openings. No crepitus or deformity. 2nd toe surgically absent. Strength and sensation grossly  intact, distal pulses 1+ bilaterally but still grossly intact, compartments soft.   Neurological: He is alert and oriented to person, place, and time. He has normal strength. No sensory deficit.  Skin: Skin is warm, dry and intact. No rash noted.  Psychiatric: He has a normal mood and affect.  Nursing note and vitals reviewed.    ED Treatments / Results  Labs (all labs ordered are listed, but only abnormal results are displayed) Labs Reviewed - No data to display  EKG  EKG Interpretation None       Radiology No results found.  Procedures Procedures (including critical care time)  Medications Ordered in ED Medications - No data to display   Initial Impression / Assessment and Plan / ED Course  I have reviewed the triage vital signs and the nursing notes.  Pertinent labs & imaging results that were available during my care of the patient were reviewed by me and considered in my medical decision making (see chart for details).     83 y.o. male here with L 1st MTP joint redness, swelling, pain, and warmth x2 days. Hx of gout. On exam, redness/swelling/warmth  around 1st MTP joint, mild tenderness, NVI with soft compartments, consistent with gout flare. Advised avoidance of foods that aggravate gout, discussed OTC remedies, RICE, and will rx prednisone given borderline kidney function thus want to avoid colchicine. Pt can't take NSAIDs due to allergies. Advised f/up with PCP in 5 days for recheck. I explained the diagnosis and have given explicit precautions to return to the ER including for any other new or worsening symptoms. The patient understands and accepts the medical plan as it's been dictated and I have answered their questions. Discharge instructions concerning home care and prescriptions have been given. The patient is STABLE and is discharged to home in good condition.    Final Clinical Impressions(s) / ED Diagnoses   Final diagnoses:  Acute gout involving toe of left foot, unspecified cause    New Prescriptions New Prescriptions   PREDNISONE (DELTASONE) 20 MG TABLET    2 tabs po daily x 5 days     [address], PA-C 05/08/16 [place], MD 05/09/16 [phone]

## 2016-05-08 NOTE — ED Triage Notes (Signed)
Redness, swelling and pain to L foot just below big toe x 2 days, warm to touch.

## 2016-05-08 NOTE — Discharge Instructions (Signed)
Wear open toed shoes for comfort. Ice and elevate the foot throughout the day, using ice pack for no more than 20 minutes every hour.  Use tylenol as needed for pain relief. Take prednisone as directed. Avoid foods that trigger gout (see list below). Follow up with your regular doctor in 5 days for recheck of symptoms. Return to the ER for changes or worsening symptoms.

## 2016-05-10 ENCOUNTER — Other Ambulatory Visit: Payer: Self-pay | Admitting: Family Medicine

## 2016-05-10 DIAGNOSIS — R748 Abnormal levels of other serum enzymes: Secondary | ICD-10-CM

## 2016-05-11 ENCOUNTER — Other Ambulatory Visit (INDEPENDENT_AMBULATORY_CARE_PROVIDER_SITE_OTHER): Payer: Medicare Other

## 2016-05-11 DIAGNOSIS — R748 Abnormal levels of other serum enzymes: Secondary | ICD-10-CM

## 2016-05-11 LAB — GAMMA GT: GGT: 152 U/L — ABNORMAL HIGH (ref 7–51)

## 2016-05-14 ENCOUNTER — Ambulatory Visit: Payer: PRIVATE HEALTH INSURANCE | Admitting: Family Medicine

## 2016-05-27 ENCOUNTER — Other Ambulatory Visit: Payer: Self-pay | Admitting: Family Medicine

## 2016-05-27 ENCOUNTER — Ambulatory Visit (HOSPITAL_BASED_OUTPATIENT_CLINIC_OR_DEPARTMENT_OTHER)
Admission: RE | Admit: 2016-05-27 | Discharge: 2016-05-27 | Disposition: A | Discharge status: Home / Self Care | Payer: Medicare Other | Source: Ambulatory Visit | Attending: Family Medicine | Admitting: Family Medicine

## 2016-05-27 ENCOUNTER — Ambulatory Visit (INDEPENDENT_AMBULATORY_CARE_PROVIDER_SITE_OTHER): Payer: Medicare Other | Admitting: Family Medicine

## 2016-05-27 ENCOUNTER — Encounter: Payer: Self-pay | Admitting: Family Medicine

## 2016-05-27 VITALS — BP 104/50 | HR 65 | Temp 97.9°F | Wt 166.8 lb

## 2016-05-27 DIAGNOSIS — J189 Pneumonia, unspecified organism: Secondary | ICD-10-CM

## 2016-05-27 DIAGNOSIS — R739 Hyperglycemia, unspecified: Secondary | ICD-10-CM | POA: Diagnosis not present

## 2016-05-27 DIAGNOSIS — R0689 Other abnormalities of breathing: Secondary | ICD-10-CM | POA: Diagnosis not present

## 2016-05-27 DIAGNOSIS — M79674 Pain in right toe(s): Secondary | ICD-10-CM | POA: Insufficient documentation

## 2016-05-27 DIAGNOSIS — E785 Hyperlipidemia, unspecified: Secondary | ICD-10-CM | POA: Diagnosis not present

## 2016-05-27 DIAGNOSIS — M7989 Other specified soft tissue disorders: Secondary | ICD-10-CM | POA: Diagnosis not present

## 2016-05-27 DIAGNOSIS — M810 Age-related osteoporosis without current pathological fracture: Secondary | ICD-10-CM

## 2016-05-27 DIAGNOSIS — J90 Pleural effusion, not elsewhere classified: Secondary | ICD-10-CM | POA: Insufficient documentation

## 2016-05-27 DIAGNOSIS — I2510 Atherosclerotic heart disease of native coronary artery without angina pectoris: Secondary | ICD-10-CM

## 2016-05-27 DIAGNOSIS — I10 Essential (primary) hypertension: Secondary | ICD-10-CM

## 2016-05-27 DIAGNOSIS — R918 Other nonspecific abnormal finding of lung field: Secondary | ICD-10-CM | POA: Diagnosis not present

## 2016-05-27 DIAGNOSIS — M109 Gout, unspecified: Secondary | ICD-10-CM

## 2016-05-27 DIAGNOSIS — E039 Hypothyroidism, unspecified: Secondary | ICD-10-CM | POA: Diagnosis not present

## 2016-05-27 DIAGNOSIS — L539 Erythematous condition, unspecified: Secondary | ICD-10-CM | POA: Diagnosis not present

## 2016-05-27 HISTORY — DX: Other abnormalities of breathing: R06.89

## 2016-05-27 LAB — CBC
HEMATOCRIT: 41.4 % (ref 39.0–52.0)
Hemoglobin: 13.9 g/dL (ref 13.0–17.0)
MCHC: 33.6 g/dL (ref 30.0–36.0)
MCV: 98.8 fl (ref 78.0–100.0)
Platelets: 184 10*3/uL (ref 150.0–400.0)
RBC: 4.19 Mil/uL — ABNORMAL LOW (ref 4.22–5.81)
RDW: 13.9 % (ref 11.5–15.5)
WBC: 8.4 10*3/uL (ref 4.0–10.5)

## 2016-05-27 LAB — COMPREHENSIVE METABOLIC PANEL
ALBUMIN: 3.3 g/dL — AB (ref 3.5–5.2)
ALK PHOS: 158 U/L — AB (ref 39–117)
ALT: 21 U/L (ref 0–53)
AST: 26 U/L (ref 0–37)
BILIRUBIN TOTAL: 0.8 mg/dL (ref 0.2–1.2)
BUN: 20 mg/dL (ref 6–23)
CALCIUM: 9.1 mg/dL (ref 8.4–10.5)
CO2: 30 mEq/L (ref 19–32)
CREATININE: 1.29 mg/dL (ref 0.40–1.50)
Chloride: 108 mEq/L (ref 96–112)
GFR: 55.85 mL/min — ABNORMAL LOW (ref >60.00)
Glucose, Bld: 92 mg/dL (ref 70–99)
Potassium: 4.5 mEq/L (ref 3.5–5.1)
SODIUM: 141 meq/L (ref 135–145)
TOTAL PROTEIN: 6.2 g/dL (ref 6.0–8.3)

## 2016-05-27 LAB — URIC ACID: Uric Acid, Serum: 5.8 mg/dL (ref 4.0–7.8)

## 2016-05-27 LAB — TSH: TSH: 4.09 u[IU]/mL (ref 0.35–4.50)

## 2016-05-27 MED ORDER — AMOXICILLIN-POT CLAVULANATE ER 1000-62.5 MG PO TB12: 2.0000 | ORAL_TABLET | Freq: Two times a day (BID) | ORAL | 0 refills | Status: DC

## 2016-05-27 MED ORDER — PREDNISONE 20 MG PO TABS: 40.0000 mg | ORAL_TABLET | Freq: Every day | ORAL | 0 refills | Status: DC | PRN

## 2016-05-27 NOTE — Progress Notes (Signed)
Patient ID: Fernando Liu, male   DOB: 1926/12/29, 83 y.o.   MRN: [id number]   Subjective:  I acted as a Education administrator for Penni Homans, [place], [place]   Patient ID: Fernando Liu, male    DOB: 1926-07-03, 83 y.o.   MRN: [id number]  Chief Complaint  Patient presents with  . Hyperlipidemia    20-month F/U.  Marland Kitchen Gout    Middle toe on R foot for the past 2 weeks.    Hyperlipidemia  This is a chronic problem. The problem is controlled. Associated symptoms include chest pain. Pertinent negatives include no shortness of breath.    Patient is in today for a 32-month follow up. Patient states that he his experiencing symptoms of gout in the middle toe on his right foot. Has been experiencing discomfort for the past two weeks. Patient also states that he has discontinued taking the iron tablets. Patient has a Hx of HTN, gout, hyperlipidemia, osteoporosis, anemia. Patient has no additional acute concerns noted at this time. She denies any restless or warmth over painful spot. No trauma. Is trying to exercise but pain is prohibitive. Reports intermittent chest discomfort but it is at his baseline and resolves spontaneously, he follows with cardiology. Denies palp/SOB/HA/congestion/fevers/GI or GU c/o. Taking meds as prescribed  Patient Care Team: Mosie Lukes, MD as PCP - General (Family Medicine) Becky Sax, MD as Referring Physician (Orthopedic Surgery) Noralyn Pick, MD as Consulting Physician (Ophthalmology)   Past Medical History:  Diagnosis Date  . Anemia 12/28/2015  . CAD (coronary artery disease)   . Chicken pox 83 yrs old  . Decreased breath sounds 05/27/2016  . Gout 04/24/2015  . High cholesterol   . Hypertension   . Measles as a child  . Medicare annual wellness visit, subsequent 10/31/2012   Living will wife and daughter have a copy, does not want heroic measures Sees cardiology his recent cardiologist is retiring his new doctor will be at [employer]   . Neuropathy   .  Osteoporosis 12/01/2015  . Preventative health care 10/31/2012  . Thyroid disease     Past Surgical History:  Procedure Laterality Date  . CATARACT EXTRACTION, BILATERAL     August & November 2012  . FOOT SURGERY Left   . HERNIA REPAIR    . PELVIC ABCESS DRAINAGE     states surgery affected control of BM  . TONSILLECTOMY      Family History  Problem Relation Age of Onset  . Stroke Mother   . Hypertension Mother   . Cancer Maternal Grandmother   . Heart disease Father   . Cancer Daughter   . Breast cancer Neg Hx   . Diabetes Neg Hx   . Colon cancer Neg Hx   . Prostate cancer Neg Hx     Social History   Social History  . Marital status: Married    Spouse name: N/A  . Number of children: N/A  . Years of education: N/A   Occupational History  . Not on file.   Social History Main Topics  . Smoking status: Never Smoker  . Smokeless tobacco: Never Used  . Alcohol use 1.2 oz/week    2 Shots of liquor per week     Comment: daily  . Drug use: No  . Sexual activity: Not on file   Other Topics Concern  . Not on file   Social History Narrative  . No narrative on file    Outpatient Medications Prior to Visit  Medication Sig Dispense Refill  . allopurinol (ZYLOPRIM) 100 MG tablet TAKE 1 TABLET (100 MG TOTAL) BY MOUTH DAILY. 30 tablet 6  . atorvastatin (LIPITOR) 10 MG tablet TAKE 1 TABLET BY MOUTH AT BEDTIME 30 tablet 4  . b complex vitamins tablet Take 1 tablet by mouth daily.    . clopidogrel (PLAVIX) 75 MG tablet Take 75 mg by mouth daily.     . Ferrous Fumarate (HEMOCYTE) 324 (106 Fe) MG TABS tablet Take 1 tablet (106 mg of iron total) by mouth daily. Iron supplement for anemia 30 tablet 2  . hydrocortisone (ANUSOL-HC) 25 MG suppository Place 1 suppository (25 mg total) rectally at bedtime as needed for hemorrhoids or itching. 12 suppository 0  . IRON PO Take by mouth.    . levothyroxine (SYNTHROID, LEVOTHROID) 75 MCG tablet TAKE 1 TABLET BY MOUTH EVERY DAY 30  tablet 4  . metoprolol tartrate (LOPRESSOR) 25 MG tablet Take 0.5 tablets (12.5 mg total) by mouth 2 (two) times daily. Take 1/2 a tablet twice a day 30 tablet 4  . MICRONIZED COLESTIPOL HCL 1 G tablet Take 1 g by mouth 2 (two) times daily.  2  . nitroGLYCERIN (NITROSTAT) 0.4 MG SL tablet Place 0.4 mg under the tongue every 5 (five) minutes as needed. For chest pain    . ranitidine (ZANTAC) 150 MG capsule Take 1 capsule (150 mg total) by mouth every evening. For acid protection 30 capsule 2  . predniSONE (DELTASONE) 20 MG tablet 2 tabs po daily x 5 days 10 tablet 0   No facility-administered medications prior to visit.     Allergies  Allergen Reactions  . Aspirin Swelling    Review of Systems  Constitutional: Negative for fever and malaise/fatigue.  HENT: Negative for congestion.   Eyes: Negative for blurred vision.  Respiratory: Negative for cough and shortness of breath.   Cardiovascular: Positive for chest pain. Negative for palpitations and leg swelling.  Gastrointestinal: Negative for vomiting.  Musculoskeletal: Positive for joint pain. Negative for back pain.       Possible gout in middle toe on R foot.  Skin: Negative for rash.  Neurological: Negative for loss of consciousness and headaches.       Objective:    Physical Exam  Constitutional: He is oriented to person, place, and time. He appears well-developed and well-nourished. No distress.  HENT:  Head: Normocephalic and atraumatic.  Eyes: Conjunctivae are normal.  Neck: Normal range of motion. No thyromegaly present.  Cardiovascular: Normal rate.   Pulmonary/Chest: Effort normal and breath sounds normal. He has no wheezes.  Decreased BS RLL  Abdominal: Soft. Bowel sounds are normal. There is no tenderness.  Musculoskeletal: He exhibits no edema or deformity.  Right 3 rd toe red and swollen  Neurological: He is alert and oriented to person, place, and time.  Skin: Skin is warm and dry. He is not diaphoretic.    Psychiatric: He has a normal mood and affect.    BP (!) 104/50 (BP Location: Left Arm, Patient Position: Sitting, Cuff Size: Normal)   Pulse 65   Temp 97.9 F (36.6 C) (Oral)   Wt 166 lb 12.8 oz (75.7 kg)   SpO2 98% Comment: RA  BMI 23.93 kg/m  Wt Readings from Last 3 Encounters:  05/27/16 166 lb 12.8 oz (75.7 kg)  05/08/16 160 lb (72.6 kg)  02/27/16 167 lb 3.2 oz (75.8 kg)   BP Readings from Last 3 Encounters:  05/27/16 (!) 104/50  05/08/16 (!) 142/60  02/27/16 (!) 114/58     Immunization History  Administered Date(s) Administered  . Pneumococcal Conjugate-13 10/31/2012  . Td 01/21/2011    Health Maintenance  Topic Date Due  . PNA vac Low Risk Adult (2 of 2 - PPSV23) 08/27/2016 (Originally 10/31/2013)  . INFLUENZA VACCINE  08/25/2016  . TETANUS/TDAP  01/20/2021    Lab Results  Component Value Date   WBC 8.4 05/27/2016   HGB 13.9 05/27/2016   HCT 41.4 05/27/2016   PLT 184.0 05/27/2016   GLUCOSE 92 05/27/2016   CHOL 113 05/07/2016   TRIG 65.0 05/07/2016   HDL 52.60 05/07/2016   LDLCALC 47 05/07/2016   ALT 21 05/27/2016   AST 26 05/27/2016   NA 141 05/27/2016   K 4.5 05/27/2016   CL 108 05/27/2016   CREATININE 1.29 05/27/2016   BUN 20 05/27/2016   CO2 30 05/27/2016   TSH 4.09 05/27/2016   PSA 0.62 04/30/2013   HGBA1C 5.2 05/07/2016    Lab Results  Component Value Date   TSH 4.09 05/27/2016   Lab Results  Component Value Date   WBC 8.4 05/27/2016   HGB 13.9 05/27/2016   HCT 41.4 05/27/2016   MCV 98.8 05/27/2016   PLT 184.0 05/27/2016   Lab Results  Component Value Date   NA 141 05/27/2016   K 4.5 05/27/2016   CO2 30 05/27/2016   GLUCOSE 92 05/27/2016   BUN 20 05/27/2016   CREATININE 1.29 05/27/2016   BILITOT 0.8 05/27/2016   ALKPHOS 158 (H) 05/27/2016   AST 26 05/27/2016   ALT 21 05/27/2016   PROT 6.2 05/27/2016   ALBUMIN 3.3 (L) 05/27/2016   CALCIUM 9.1 05/27/2016   GFR 55.85 (L) 05/27/2016   Lab Results  Component Value Date    CHOL 113 05/07/2016   Lab Results  Component Value Date   HDL 52.60 05/07/2016   Lab Results  Component Value Date   LDLCALC 47 05/07/2016   Lab Results  Component Value Date   TRIG 65.0 05/07/2016   Lab Results  Component Value Date   CHOLHDL 2 05/07/2016   Lab Results  Component Value Date   HGBA1C 5.2 05/07/2016         Assessment & Plan:   Problem List Items Addressed This Visit    CAD (coronary artery disease)    Has atypical chest discomfort most days with exertion. He follows closely with cardiology. He rests and it resolves. Does not need th NTG.      HTN (hypertension)    Well controlled, no changes to meds. Encouraged heart healthy diet such as the DASH diet and exercise as tolerated.       Relevant Orders   TSH (Completed)   Comprehensive metabolic panel (Completed)   CBC (Completed)   Hypothyroidism    On Levothyroxine, continue to monitor, check TSH today due to elevated on last check.       Hyperglycemia    hgba1c acceptable, minimize simple carbs. Increase exercise as tolerated      Hyperlipidemia    Tolerating statin, encouraged heart healthy diet, avoid trans fats, minimize simple carbs and saturated fats. Increase exercise as tolerated      Gout    3rd toe on right foot now flared his episode in left foot resolved with Prednisone. Given refill to use prn. Pain is not bad but it is very inflamed and persistent for roughly. Check xray.      Relevant Orders   Uric acid (Completed)  DG Toe 3rd Right (Completed)   Osteoporosis    Encouraged to get adequate exercise, calcium and vitamin d intake      Decreased breath sounds    RLL, check a cxr       Other Visit Diagnoses    Abnormal breath sounds    -  Primary   Relevant Orders   DG Chest 2 View (Completed)      I have changed Mr. Haden's predniSONE. I am also having him maintain his clopidogrel, nitroGLYCERIN, MICRONIZED COLESTIPOL HCL, allopurinol, levothyroxine,  atorvastatin, metoprolol tartrate, hydrocortisone, Ferrous Fumarate, ranitidine, b complex vitamins, and IRON PO.  Meds ordered this encounter  Medications  . predniSONE (DELTASONE) 20 MG tablet    Sig: Take 2 tablets (40 mg total) by mouth daily as needed. 2 tabs po daily x 5 days    Dispense:  10 tablet    Refill:  0    CMA served as scribe during this visit. History, Physical and Plan performed by medical provider. Documentation and orders reviewed and attested to.  Penni Homans, MD

## 2016-05-27 NOTE — Assessment & Plan Note (Signed)
RLL, check a cxr

## 2016-05-27 NOTE — Progress Notes (Signed)
Pre visit review using our clinic review tool, if applicable. No additional management support is needed unless otherwise documented below in the visit note. 

## 2016-05-27 NOTE — Assessment & Plan Note (Signed)
Has atypical chest discomfort most days with exertion. He follows closely with cardiology. He rests and it resolves. Does not need th NTG.

## 2016-05-27 NOTE — Patient Instructions (Signed)
Make sure multivitamin has iron Gout Gout is painful swelling that can occur in some of your joints. Gout is a type of arthritis. This condition is caused by having too much uric acid in your body. Uric acid is a chemical that forms when your body breaks down substances called purines. Purines are important for building body proteins. When your body has too much uric acid, sharp crystals can form and build up inside your joints. This causes pain and swelling. Gout attacks can happen quickly and be very painful (acute gout). Over time, the attacks can affect more joints and become more frequent (chronic gout). Gout can also cause uric acid to build up under your skin and inside your kidneys. What are the causes? This condition is caused by too much uric acid in your blood. This can occur because:  Your kidneys do not remove enough uric acid from your blood. This is the most common cause.  Your body makes too much uric acid. This can occur with some cancers and cancer treatments. It can also occur if your body is breaking down too many red blood cells (hemolytic anemia).  You eat too many foods that are high in purines. These foods include organ meats and some seafood. Alcohol, especially beer, is also high in purines. A gout attack may be triggered by trauma or stress. What increases the risk? This condition is more likely to develop in people who:  Have a family history of gout.  Are male and middle-aged.  Are male and have gone through menopause.  Are obese.  Frequently drink alcohol, especially beer.  Are dehydrated.  Lose weight too quickly.  Have an organ transplant.  Have lead poisoning.  Take certain medicines, including aspirin, cyclosporine, diuretics, levodopa, and niacin.  Have kidney disease or psoriasis. What are the signs or symptoms? An attack of acute gout happens quickly. It usually occurs in just one joint. The most common place is the big toe. Attacks often  start at night. Other joints that may be affected include joints of the feet, ankle, knee, fingers, wrist, or elbow. Symptoms may include:  Severe pain.  Warmth.  Swelling.  Stiffness.  Tenderness. The affected joint may be very painful to touch.  Shiny, red, or purple skin.  Chills and fever. Chronic gout may cause symptoms more frequently. More joints may be involved. You may also have white or yellow lumps (tophi) on your hands or feet or in other areas near your joints. How is this diagnosed? This condition is diagnosed based on your symptoms, medical history, and physical exam. You may have tests, such as:  Blood tests to measure uric acid levels.  Removal of joint fluid with a needle (aspiration) to look for uric acid crystals.  X-rays to look for joint damage. How is this treated? Treatment for this condition has two phases: treating an acute attack and preventing future attacks. Acute gout treatment may include medicines to reduce pain and swelling, including:  NSAIDs.  Steroids. These are strong anti-inflammatory medicines that can be taken by mouth (orally) or injected into a joint.  Colchicine. This medicine relieves pain and swelling when it is taken soon after an attack. It can be given orally or through an IV tube. Preventive treatment may include:  Daily use of smaller doses of NSAIDs or colchicine.  Use of a medicine that reduces uric acid levels in your blood.  Changes to your diet. You may need to see a specialist about healthy eating (dietitian).  Follow these instructions at home: During a Gout Attack   If directed, apply ice to the affected area:  Put ice in a plastic bag.  Place a towel between your skin and the bag.  Leave the ice on for 20 minutes, 2-3 times a day.  Rest the joint as much as possible. If the affected joint is in your leg, you may be given crutches to use.  Raise (elevate) the affected joint above the level of your heart as  often as possible.  Drink enough fluids to keep your urine clear or pale yellow.  Take over-the-counter and prescription medicines only as told by your health care provider.  Do not drive or operate heavy machinery while taking prescription pain medicine.  Follow instructions from your health care provider about eating or drinking restrictions.  Return to your normal activities as told by your health care provider. Ask your health care provider what activities are safe for you. Avoiding Future Gout Attacks   Follow a low-purine diet as told by your dietitian or health care provider. Avoid foods and drinks that are high in purines, including liver, kidney, anchovies, asparagus, herring, mushrooms, mussels, and beer.  Limit alcohol intake to no more than 1 drink a day for nonpregnant women and 2 drinks a day for men. One drink equals 12 oz of beer, 5 oz of wine, or 1 oz of hard liquor.  Maintain a healthy weight or lose weight if you are overweight. If you want to lose weight, talk with your health care provider. It is important that you do not lose weight too quickly.  Start or maintain an exercise program as told by your health care provider.  Drink enough fluids to keep your urine clear or pale yellow.  Take over-the-counter and prescription medicines only as told by your health care provider.  Keep all follow-up visits as told by your health care provider. This is important. Contact a health care provider if:  You have another gout attack.  You continue to have symptoms of a gout attack after10 days of treatment.  You have side effects from your medicines.  You have chills or a fever.  You have burning pain when you urinate.  You have pain in your lower back or belly. Get help right away if:  You have severe or uncontrolled pain.  You cannot urinate. This information is not intended to replace advice given to you by your health care provider. Make sure you discuss any  questions you have with your health care provider. Document Released: 01/09/2000 Document Revised: 06/19/2015 Document Reviewed: 10/24/2014 Elsevier Interactive Patient Education  2017 [employer].

## 2016-05-27 NOTE — Assessment & Plan Note (Addendum)
On Levothyroxine, continue to monitor, check TSH today due to elevated on last check.

## 2016-05-27 NOTE — Assessment & Plan Note (Signed)
Encouraged to get adequate exercise, calcium and vitamin d intake 

## 2016-05-27 NOTE — Assessment & Plan Note (Signed)
Well controlled, no changes to meds. Encouraged heart healthy diet such as the DASH diet and exercise as tolerated.  °

## 2016-05-27 NOTE — Assessment & Plan Note (Signed)
hgba1c acceptable, minimize simple carbs. Increase exercise as tolerated.  

## 2016-05-27 NOTE — Assessment & Plan Note (Addendum)
3rd toe on right foot now flared his episode in left foot resolved with Prednisone. Given refill to use prn. Pain is not bad but it is very inflamed and persistent for roughly. Check xray.

## 2016-05-30 NOTE — Assessment & Plan Note (Signed)
Tolerating statin, encouraged heart healthy diet, avoid trans fats, minimize simple carbs and saturated fats. Increase exercise as tolerated 

## 2016-06-07 ENCOUNTER — Encounter: Payer: Self-pay | Admitting: Family Medicine

## 2016-06-07 ENCOUNTER — Ambulatory Visit (INDEPENDENT_AMBULATORY_CARE_PROVIDER_SITE_OTHER): Payer: Medicare Other | Admitting: Family Medicine

## 2016-06-07 DIAGNOSIS — I2510 Atherosclerotic heart disease of native coronary artery without angina pectoris: Secondary | ICD-10-CM

## 2016-06-07 DIAGNOSIS — M79674 Pain in right toe(s): Secondary | ICD-10-CM | POA: Diagnosis present

## 2016-06-07 DIAGNOSIS — M79675 Pain in left toe(s): Secondary | ICD-10-CM | POA: Diagnosis not present

## 2016-06-07 NOTE — Patient Instructions (Signed)
Your left toe pain is due to severe arthritis. These are the different medications you can take for this: Tylenol 500mg  1-2 tabs three times a day for pain. Capsaicin, aspercreme, or biofreeze topically up to four times a day may also help with pain. Some supplements that may help for arthritis: Boswellia extract, curcumin, pycnogenol It's important that you continue to stay active - if you're having difficulty with balance make sure you discuss with Dr. Charlett Blake. Shoe inserts with good arch support may be helpful. Walker or cane if needed.  I think the right 3rd toe is at baseline.  If you develop a fever above 100.4 degrees, red streaking from here, warmth (right now it's the same temperature as surrounding toes), pus draining from this, or any other concerns call us. I wouldn't recommend any additional treatment for the nonunion (not healed) fracture you have here.

## 2016-06-09 DIAGNOSIS — M79674 Pain in right toe(s): Secondary | ICD-10-CM | POA: Insufficient documentation

## 2016-06-09 DIAGNOSIS — E785 Hyperlipidemia, unspecified: Secondary | ICD-10-CM | POA: Diagnosis not present

## 2016-06-09 DIAGNOSIS — I440 Atrioventricular block, first degree: Secondary | ICD-10-CM | POA: Diagnosis not present

## 2016-06-09 DIAGNOSIS — N183 Chronic kidney disease, stage 3 (moderate): Secondary | ICD-10-CM | POA: Diagnosis not present

## 2016-06-09 DIAGNOSIS — K922 Gastrointestinal hemorrhage, unspecified: Secondary | ICD-10-CM | POA: Diagnosis not present

## 2016-06-09 DIAGNOSIS — I10 Essential (primary) hypertension: Secondary | ICD-10-CM | POA: Diagnosis not present

## 2016-06-09 DIAGNOSIS — M79675 Pain in left toe(s): Principal | ICD-10-CM

## 2016-06-09 DIAGNOSIS — I2510 Atherosclerotic heart disease of native coronary artery without angina pectoris: Secondary | ICD-10-CM | POA: Diagnosis not present

## 2016-06-09 NOTE — Progress Notes (Signed)
PCP: Mosie Lukes, MD  Subjective:   HPI: Patient is a 83 y.o. male here for toe pain.  Patient reports he's had about a month of pain right 3rd digit (reports off and on for 5 months though). No pain currently. Has been on antibiotic for a week which he feels has helped. Still having swelling but no pain. Has history of gout and prior toe amputation as well. He and wife report a couple falls at home Pain level currently 0/10. He also gets pain off and on base of left great toe. No numbness.  Past Medical History:  Diagnosis Date  . Anemia 12/28/2015  . CAD (coronary artery disease)   . Chicken pox 83 yrs old  . Decreased breath sounds 05/27/2016  . Gout 04/24/2015  . High cholesterol   . Hypertension   . Measles as a child  . Medicare annual wellness visit, subsequent 10/31/2012   Living will wife and daughter have a copy, does not want heroic measures Sees cardiology his recent cardiologist is retiring his new doctor will be at [employer]   . Neuropathy   . Osteoporosis 12/01/2015  . Preventative health care 10/31/2012  . Thyroid disease     Current Outpatient Prescriptions on File Prior to Visit  Medication Sig Dispense Refill  . allopurinol (ZYLOPRIM) 100 MG tablet TAKE 1 TABLET (100 MG TOTAL) BY MOUTH DAILY. 30 tablet 6  . amoxicillin-clavulanate (AUGMENTIN XR) 1000-62.5 MG 12 hr tablet Take 2 tablets by mouth 2 (two) times daily. Take for 7 days (Patient taking differently: Take 1 tablet by mouth 2 (two) times daily. Take for 7 days ) 14 tablet 0  . atorvastatin (LIPITOR) 10 MG tablet TAKE 1 TABLET BY MOUTH AT BEDTIME 30 tablet 4  . b complex vitamins tablet Take 1 tablet by mouth daily.    . clopidogrel (PLAVIX) 75 MG tablet Take 75 mg by mouth daily.     . Ferrous Fumarate (HEMOCYTE) 324 (106 Fe) MG TABS tablet Take 1 tablet (106 mg of iron total) by mouth daily. Iron supplement for anemia 30 tablet 2  . hydrocortisone (ANUSOL-HC) 25 MG suppository Place 1 suppository (25  mg total) rectally at bedtime as needed for hemorrhoids or itching. 12 suppository 0  . IRON PO Take by mouth.    . levothyroxine (SYNTHROID, LEVOTHROID) 75 MCG tablet TAKE 1 TABLET BY MOUTH EVERY DAY 30 tablet 4  . metoprolol tartrate (LOPRESSOR) 25 MG tablet Take 0.5 tablets (12.5 mg total) by mouth 2 (two) times daily. Take 1/2 a tablet twice a day 30 tablet 4  . MICRONIZED COLESTIPOL HCL 1 G tablet Take 1 g by mouth 2 (two) times daily.  2  . nitroGLYCERIN (NITROSTAT) 0.4 MG SL tablet Place 0.4 mg under the tongue every 5 (five) minutes as needed. For chest pain    . predniSONE (DELTASONE) 20 MG tablet Take 2 tablets (40 mg total) by mouth daily as needed. 2 tabs po daily x 5 days 10 tablet 0  . ranitidine (ZANTAC) 150 MG capsule Take 1 capsule (150 mg total) by mouth every evening. For acid protection 30 capsule 2   No current facility-administered medications on file prior to visit.     Past Surgical History:  Procedure Laterality Date  . CATARACT EXTRACTION, BILATERAL     August & November 2012  . FOOT SURGERY Left   . HERNIA REPAIR    . PELVIC ABCESS DRAINAGE     states surgery affected control of BM  .  TONSILLECTOMY      Allergies  Allergen Reactions  . Aspirin Swelling    Social History   Social History  . Marital status: Married    Spouse name: N/A  . Number of children: N/A  . Years of education: N/A   Occupational History  . Not on file.   Social History Main Topics  . Smoking status: Never Smoker  . Smokeless tobacco: Never Used  . Alcohol use 1.2 oz/week    2 Shots of liquor per week     Comment: daily  . Drug use: No  . Sexual activity: Not on file   Other Topics Concern  . Not on file   Social History Narrative  . No narrative on file    Family History  Problem Relation Age of Onset  . Stroke Mother   . Hypertension Mother   . Cancer Maternal Grandmother   . Heart disease Father   . Cancer Daughter   . Breast cancer Neg Hx   . Diabetes  Neg Hx   . Colon cancer Neg Hx   . Prostate cancer Neg Hx     BP (!) 146/59   Pulse 69   Ht 5\' 10"  (1.778 m)   Wt 165 lb (74.8 kg)   BMI 23.68 kg/m   Review of Systems: See HPI above.     Objective:  Physical Exam:  Gen: NAD, comfortable in exam room  Right foot: Hallux rigidus.  No bruising.  No warmth of digits though noted IP joint deformities of all digits.  Swelling of 3rd digit throughout with mild redness. No streaking up the foot from digits. No tenderness to palpation currently. Able to flex and extend digits. Sensation intact to light touch. Cap refill < 3 sec.  Left foot: Severe hallux rigidus. Mild TTP 1st MTP joint. No erythema, ecchymoses, other deformity of great toe. Very limited motion of 1st MTP joint.   Assessment & Plan:  1. Right 3rd digit pain - independently reviewed radiographs.  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  Advised against any aggressive intervention.  Believe this is now at baseline - complete antibiotics.  Discussed red flags.  Discussed taping, ensuring supportive shoes.  2. 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  Discussed tylenol, topical medications, some supplements that may help.  Inserts with arch support.  Walker/cane if needed.  F/u prn.

## 2016-06-09 NOTE — Assessment & Plan Note (Signed)
Right 3rd digit pain - independently reviewed radiographs.  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  Advised against any aggressive intervention.  Believe this is now at baseline - complete antibiotics.  Discussed red flags.  Discussed taping, ensuring supportive shoes.  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  Discussed tylenol, topical medications, some supplements that may help.  Inserts with arch support.  Walker/cane if needed.  F/u prn.

## 2016-06-14 ENCOUNTER — Other Ambulatory Visit: Payer: Self-pay | Admitting: Family Medicine

## 2016-06-22 ENCOUNTER — Other Ambulatory Visit: Payer: Self-pay | Admitting: Family Medicine

## 2016-06-23 ENCOUNTER — Other Ambulatory Visit: Payer: Self-pay | Admitting: Family Medicine

## 2016-06-23 ENCOUNTER — Ambulatory Visit (HOSPITAL_BASED_OUTPATIENT_CLINIC_OR_DEPARTMENT_OTHER)
Admission: RE | Admit: 2016-06-23 | Discharge: 2016-06-23 | Disposition: A | Discharge status: Home / Self Care | Payer: Medicare Other | Source: Ambulatory Visit | Attending: Family Medicine | Admitting: Family Medicine

## 2016-06-23 DIAGNOSIS — J189 Pneumonia, unspecified organism: Secondary | ICD-10-CM | POA: Insufficient documentation

## 2016-06-23 DIAGNOSIS — I700 Atherosclerosis of aorta: Secondary | ICD-10-CM | POA: Diagnosis not present

## 2016-06-23 DIAGNOSIS — J90 Pleural effusion, not elsewhere classified: Secondary | ICD-10-CM | POA: Insufficient documentation

## 2016-06-24 ENCOUNTER — Ambulatory Visit (INDEPENDENT_AMBULATORY_CARE_PROVIDER_SITE_OTHER): Payer: Medicare Other | Admitting: Pulmonary Disease

## 2016-06-24 ENCOUNTER — Encounter: Payer: Self-pay | Admitting: Pulmonary Disease

## 2016-06-24 VITALS — BP 138/72 | HR 63 | Ht 67.5 in | Wt 163.0 lb

## 2016-06-24 DIAGNOSIS — I2510 Atherosclerotic heart disease of native coronary artery without angina pectoris: Secondary | ICD-10-CM | POA: Diagnosis not present

## 2016-06-24 DIAGNOSIS — J90 Pleural effusion, not elsewhere classified: Secondary | ICD-10-CM | POA: Diagnosis not present

## 2016-06-24 NOTE — Patient Instructions (Signed)
Will schedule CT chest with IV contrast  Follow up in 3 weeks with Dr. Halford Chessman or Nurse Practitioner

## 2016-06-24 NOTE — Progress Notes (Signed)
   Subjective:    Patient ID: Fernando Liu, male    DOB: 04-Nov-1926, 83 y.o.   MRN: [id number]  HPI    Review of Systems  Constitutional: Negative for fever and unexpected weight change.  HENT: Negative for congestion, dental problem, ear pain, nosebleeds, postnasal drip, rhinorrhea, sinus pressure, sneezing, sore throat and trouble swallowing.   Eyes: Negative for redness and itching.  Respiratory: Positive for shortness of breath. Negative for cough, chest tightness and wheezing.   Cardiovascular: Negative for palpitations and leg swelling.  Gastrointestinal: Negative for nausea and vomiting.  Genitourinary: Negative for dysuria.  Musculoskeletal: Positive for arthralgias. Negative for joint swelling.  Skin: Negative for rash (itcing).  Neurological: Negative for headaches.  Hematological: Does not bruise/bleed easily.  Psychiatric/Behavioral: Negative for dysphoric mood. The patient is not nervous/anxious.        Objective:   Physical Exam        Assessment & Plan:

## 2016-06-24 NOTE — Progress Notes (Signed)
Past surgical history He  has a past surgical history that includes Hernia repair; Pelvic abcess drainage; Tonsillectomy; Cataract extraction, bilateral; and Foot surgery (Left).  Family history His family history includes Cancer in his daughter and maternal grandmother; Heart disease in his father; Hypertension in his mother; Stroke in his mother.  Social history He  reports that he has never smoked. He has never used smokeless tobacco. He reports that he drinks about 1.2 oz of alcohol per week . He reports that he does not use drugs.  Allergies  Allergen Reactions  . Aspirin Swelling    Review of Systems  Constitutional: Negative for fever and unexpected weight change.  HENT: Negative for congestion, dental problem, ear pain, nosebleeds, postnasal drip, rhinorrhea, sinus pressure, sneezing, sore throat and trouble swallowing.   Eyes: Negative for redness and itching.  Respiratory: Positive for shortness of breath. Negative for cough, chest tightness and wheezing.   Cardiovascular: Negative for palpitations and leg swelling.  Gastrointestinal: Negative for nausea and vomiting.  Genitourinary: Negative for dysuria.  Musculoskeletal: Positive for arthralgias. Negative for joint swelling.  Skin: Negative for rash (itcing).  Neurological: Negative for headaches.  Hematological: Does not bruise/bleed easily.  Psychiatric/Behavioral: Negative for dysphoric mood. The patient is not nervous/anxious.     Current Outpatient Prescriptions on File Prior to Visit  Medication Sig  . allopurinol (ZYLOPRIM) 100 MG tablet TAKE 1 TABLET (100 MG TOTAL) BY MOUTH DAILY.  Marland Kitchen atorvastatin (LIPITOR) 10 MG tablet TAKE 1 TABLET BY MOUTH AT BEDTIME  . b complex vitamins tablet Take 1 tablet by mouth daily.  . clopidogrel (PLAVIX) 75 MG tablet Take 75 mg by mouth daily.   . Ferrous Fumarate (HEMOCYTE) 324 (106 Fe) MG TABS tablet Take 1 tablet (106 mg of iron total) by mouth daily. Iron supplement for anemia   . hydrocortisone (ANUSOL-HC) 25 MG suppository Place 1 suppository (25 mg total) rectally at bedtime as needed for hemorrhoids or itching.  . IRON PO Take by mouth.  . levothyroxine (SYNTHROID, LEVOTHROID) 75 MCG tablet TAKE 1 TABLET BY MOUTH EVERY DAY  . MICRONIZED COLESTIPOL HCL 1 G tablet Take 1 g by mouth 2 (two) times daily.  . nitroGLYCERIN (NITROSTAT) 0.4 MG SL tablet Place 0.4 mg under the tongue every 5 (five) minutes as needed. For chest pain  . ranitidine (ZANTAC) 150 MG capsule Take 1 capsule (150 mg total) by mouth every evening. For acid protection  . metoprolol tartrate (LOPRESSOR) 25 MG tablet Take 0.5 tablets (12.5 mg total) by mouth 2 (two) times daily. Take 1/2 a tablet twice a day (Patient not taking: Reported on 06/24/2016)   No current facility-administered medications on file prior to visit.     Chief Complaint  Patient presents with  . PULMONARY CONSULT    Referred by Dr Randel Pigg for pleural effusion and recent PNA. Recent CXR 06/23/16.     Past medical history He  has a past medical history of Anemia (12/28/2015); CAD (coronary artery disease); Chicken pox (83 yrs old); Decreased breath sounds (05/27/2016); Gout (04/24/2015); High cholesterol; Hypertension; Measles (as a child); Medicare annual wellness visit, subsequent (10/31/2012); Neuropathy; Osteoporosis (12/01/2015); Preventative health care (10/31/2012); and Thyroid disease.  Vital signs BP 138/72 (BP Location: Left Arm, Cuff Size: Normal)   Pulse 63   Ht 5' 7.5" (1.715 m)   Wt 163 lb (73.9 kg)   SpO2 97%   BMI 25.15 kg/m   History of present illness Fernando Liu is a 83 y.o. male with right  pleural effusion.a  He was seen by PCP at beginning of May for f/u of gout.  At that time he was noted to have absent breath sounds on the right.  He had chest xray 05/27/16 which showed Rt pleural effusion and possible infiltrate.  He was treated with augmentin.  He had f/u chest xray on 06/23/16, and had persistent  changes.  He didn't have cough, fever, sputum, or chest pain.  He did feel like he was having trouble with his breathing, and in fact goes swimming several times per week.  He has not had sweats, weight loss, trouble swallowing, or hemoptysis.  He smoked cigarettes briefly when he was younger.  No prior history of pneumonia or exposure to tuberculosis.  He has pet cats.  He was born in [place] and then moved to [place] after he was in [employer].  He worked as an Chief Financial Officer.  He has been in [place] since 2009.  No other recent travel or sick exposures.  His wife reports that he slipped on ice in the winter.  He had a big black bruise over his back and sides of his chest.  He did not seek medical attention at that time.  He does not remember the last time he had a chest xray prior to May 27, 2016.  Physical exam  General - No distress ENT - No sinus tenderness, no oral exudate, no LAN, no thyromegaly, TM clear, pupils equal/reactive Cardiac - s1s2 regular, no murmur, pulses symmetric Chest - decreased BS with dullness on Rt Back - No focal tenderness Abd - Soft, non-tender, no organomegaly, + bowel sounds Ext - No edema Neuro - Normal strength, cranial nerves intact Skin - No rashes Psych - Normal mood, and behavior   CMP Latest Ref Rng & Units 05/27/2016 05/07/2016 12/01/2015  Glucose 70 - 99 mg/dL 92 101(H) 103(H)  BUN 6 - 23 mg/dL 20 20 24(H)  Creatinine 0.40 - 1.50 mg/dL 1.29 1.28 1.48  Sodium 135 - 145 mEq/L 141 140 141  Potassium 3.5 - 5.1 mEq/L 4.5 4.1 4.6  Chloride 96 - 112 mEq/L 108 106 110  CO2 19 - 32 mEq/L 30 29 25   Calcium 8.4 - 10.5 mg/dL 9.1 9.2 8.9  Total Protein 6.0 - 8.3 g/dL 6.2 6.5 5.8(L)  Total Bilirubin 0.2 - 1.2 mg/dL 0.8 0.9 0.5  Alkaline Phos 39 - 117 U/L 158(H) 169(H) 138(H)  AST 0 - 37 U/L 26 29 29   ALT 0 - 53 U/L 21 21 25      CBC Latest Ref Rng & Units 05/27/2016 05/07/2016 12/09/2015  WBC 4.0 - 10.5 K/uL 8.4 7.9 8.1  Hemoglobin 13.0 - 17.0  g/dL 13.9 13.9 11.7(L)  Hematocrit 39.0 - 52.0 % 41.4 42.7 35.3(L)  Platelets 150.0 - 400.0 K/uL 184.0 177.0 198.0    Dg Chest 2 View  Result Date: 06/23/2016 CLINICAL DATA:  Recent pneumonia EXAM: CHEST  2 VIEW COMPARISON:  May 27, 2016 FINDINGS: There is an apparent nipple shadow on the left. There is a persistent right pleural effusion with atelectatic change in the right base. No new opacity evident. Heart size and pulmonary vascularity within normal limits. There is aortic atherosclerosis. No adenopathy. There is degenerative change in the thoracic spine. Bones are osteoporotic. IMPRESSION: Persistent right pleural effusion with right lower lobe atelectatic change. A degree of superimposed pneumonia in the right base cannot be excluded radiographically. Left lung is clear except for apparent nipple shadow overlying left base.  Stable cardiac silhouette. There is aortic atherosclerosis. Bones are osteoporotic. Electronically Signed   By: Lowella Grip III M.D.   On: 06/23/2016 08:47    Discussion He reports having a fall in winter time with back and chest injury.  He didn't seek medical attention at that time.  He was noted to have absent breath sounds on Rt during recent PCP visit.  He was found to have right pleural effusion that has persisted in spite of antibiotic therapy.  He didn't have symptoms to suggest an infection otherwise.   Assessment/plan  Right pleural effusion. - will arrange for CT chest with contrast to further assess - he would be willing to consider thoracentesis and possibly bronchoscopy if needed - he would be reluctant to consider thoracic surgery assessment, especially since he does not have any significant respiratory symptoms   Patient Instructions  Will schedule CT chest with IV contrast  Follow up in 3 weeks with Dr. Halford Chessman or Nurse Practitioner    Chesley Mires, MD  Pulmonary/Critical Care/Sleep Pager:  [phone] 06/24/2016, 11:56 AM

## 2016-06-25 ENCOUNTER — Telehealth: Payer: Self-pay | Admitting: Family Medicine

## 2016-06-25 ENCOUNTER — Encounter (HOSPITAL_BASED_OUTPATIENT_CLINIC_OR_DEPARTMENT_OTHER): Payer: Self-pay

## 2016-06-25 ENCOUNTER — Ambulatory Visit (HOSPITAL_BASED_OUTPATIENT_CLINIC_OR_DEPARTMENT_OTHER)
Admission: RE | Admit: 2016-06-25 | Discharge: 2016-06-25 | Disposition: A | Discharge status: Home / Self Care | Payer: Medicare Other | Source: Ambulatory Visit | Attending: Pulmonary Disease | Admitting: Pulmonary Disease

## 2016-06-25 DIAGNOSIS — K769 Liver disease, unspecified: Secondary | ICD-10-CM | POA: Diagnosis not present

## 2016-06-25 DIAGNOSIS — I2510 Atherosclerotic heart disease of native coronary artery without angina pectoris: Secondary | ICD-10-CM | POA: Insufficient documentation

## 2016-06-25 DIAGNOSIS — J90 Pleural effusion, not elsewhere classified: Secondary | ICD-10-CM | POA: Diagnosis not present

## 2016-06-25 DIAGNOSIS — I700 Atherosclerosis of aorta: Secondary | ICD-10-CM | POA: Diagnosis not present

## 2016-06-25 DIAGNOSIS — K319 Disease of stomach and duodenum, unspecified: Secondary | ICD-10-CM | POA: Insufficient documentation

## 2016-06-25 DIAGNOSIS — K7460 Unspecified cirrhosis of liver: Secondary | ICD-10-CM | POA: Insufficient documentation

## 2016-06-25 MED ORDER — IOPAMIDOL (ISOVUE-300) INJECTION 61%
100.0000 mL | Freq: Once | INTRAVENOUS | Status: AC | PRN
  Administered 2016-06-25: 80 mL via INTRAVENOUS

## 2016-06-25 NOTE — Telephone Encounter (Signed)
-----   Message from Mosie Lukes, MD sent at 06/25/2016 12:09 PM EDT ----- Regarding: RE: Pt's condition after CT  Notify his symptoms are likely related to all of his conditions he does not really have meds that are likely to cause dizziness. If he continues to feel poorly he needs to come in for evaluation ----- Message ----- From: Arieliz Latino Schlein Sent: 06/25/2016  10:15 AM To: Mosie Lukes, MD Subject: Pt's condition after CT                        Pt's wife asked me to notify you of his condition after a CT scan of his chest w/contrast.  He was "off balance" when he stood up from the CT table.  He was unable to walk to our waiting area so I got a wheelchair for him.  She wonders if it is all the meds he is taking.  She asked me if it is a reaction to the contrast injection but he was dizzy and lightheaded lying on the CT table before the injection and scan even began.   Oswald Hillock

## 2016-06-25 NOTE — Telephone Encounter (Signed)
Spoke to his wife.and informed of PCP response. The wife stated they are going to wait until they hear back from pulmonary regarding this test. She did state he is in bad shape and does not care to bring him back in for an appointment.

## 2016-06-29 ENCOUNTER — Encounter: Payer: Self-pay | Admitting: Pulmonary Disease

## 2016-06-29 ENCOUNTER — Other Ambulatory Visit: Payer: Self-pay | Admitting: Pulmonary Disease

## 2016-06-29 ENCOUNTER — Telehealth: Payer: Self-pay | Admitting: Pulmonary Disease

## 2016-06-29 DIAGNOSIS — K7030 Alcoholic cirrhosis of liver without ascites: Secondary | ICD-10-CM

## 2016-06-29 DIAGNOSIS — K7460 Unspecified cirrhosis of liver: Secondary | ICD-10-CM | POA: Insufficient documentation

## 2016-06-29 DIAGNOSIS — J90 Pleural effusion, not elsewhere classified: Secondary | ICD-10-CM

## 2016-06-29 HISTORY — DX: Unspecified cirrhosis of liver: K74.60

## 2016-06-29 NOTE — Telephone Encounter (Signed)
CT chest 06/25/16 >> atherosclerosis, moderate Rt pleural effusion, severe changes of liver cirrhosis with portal venous hypertension and splenomegaly, enhancing focus in Lt lobe of liver ?[place]   Results d/w pt.  ? If he has hepatic hydrothorax.  Will arrange for IR to do Rt thoracentesis and send for analysis.  Will route information to his PCP to f/u on assessment of liver function.  Pt reports he drinks 2 to 4 bourbons a day, and has done so for years.  Advised him he needs to stop drinking alcohol given findings in liver.

## 2016-07-01 ENCOUNTER — Ambulatory Visit (HOSPITAL_COMMUNITY)
Admission: RE | Admit: 2016-07-01 | Discharge: 2016-07-01 | Disposition: A | Discharge status: Home / Self Care | Payer: Medicare Other | Source: Ambulatory Visit | Attending: Radiology | Admitting: Radiology

## 2016-07-01 ENCOUNTER — Ambulatory Visit (HOSPITAL_COMMUNITY)
Admission: RE | Admit: 2016-07-01 | Discharge: 2016-07-01 | Disposition: A | Discharge status: Home / Self Care | Payer: Medicare Other | Source: Ambulatory Visit | Attending: Pulmonary Disease | Admitting: Pulmonary Disease

## 2016-07-01 DIAGNOSIS — Z9889 Other specified postprocedural states: Secondary | ICD-10-CM | POA: Diagnosis not present

## 2016-07-01 DIAGNOSIS — J948 Other specified pleural conditions: Secondary | ICD-10-CM | POA: Diagnosis not present

## 2016-07-01 DIAGNOSIS — J90 Pleural effusion, not elsewhere classified: Secondary | ICD-10-CM | POA: Diagnosis not present

## 2016-07-01 DIAGNOSIS — K7030 Alcoholic cirrhosis of liver without ascites: Secondary | ICD-10-CM

## 2016-07-01 LAB — LACTATE DEHYDROGENASE, PLEURAL OR PERITONEAL FLUID: LD, Fluid: 72 U/L — ABNORMAL HIGH (ref 3–23)

## 2016-07-01 LAB — GRAM STAIN

## 2016-07-01 LAB — BODY FLUID CELL COUNT WITH DIFFERENTIAL
Eos, Fluid: 15 %
Lymphs, Fluid: 50 %
Monocyte-Macrophage-Serous Fluid: 31 % — ABNORMAL LOW (ref 50–90)
Neutrophil Count, Fluid: 4 % (ref 0–25)
WBC FLUID: 292 uL (ref 0–1000)

## 2016-07-01 LAB — PROTEIN, PLEURAL OR PERITONEAL FLUID: Total protein, fluid: 3.5 g/dL

## 2016-07-01 LAB — GLUCOSE, PLEURAL OR PERITONEAL FLUID: GLUCOSE FL: 94 mg/dL

## 2016-07-01 NOTE — Procedures (Addendum)
Ultrasound-guided diagnostic and therapeutic right thoracentesis performed yielding 1.7 liters of yellow fluid. No immediate complications. Follow-up chest x-ray pending. The fluid was sent to the lab for preordered studies. Since this was the patient's initial thoracentesis and coughing began only the above amount of fluid was removed today.

## 2016-07-05 ENCOUNTER — Telehealth: Payer: Self-pay | Admitting: Pulmonary Disease

## 2016-07-05 NOTE — Telephone Encounter (Signed)
Rt thoracentesis 07/01/16 >> 1.7 liters, protein 3.5, LDH 73, glucose 94, WBC 292 (4N, 50L, 44M, 15E), cytology negative   Will have my nurse inform pt that fluid tests are consistent with liver cirrhosis with abdominal fluid leaking up into his right chest.  He should follow up with his PCP to discuss additional assessment of his cirrhosis.

## 2016-07-05 NOTE — Telephone Encounter (Signed)
Spoke with patient and informed him of results and recommendations. Pt stated he will take it up with Dr. Charlett Blake. Pt verbalized understanding and did not have any questions. Nothing further is needed.

## 2016-07-06 LAB — CULTURE, BODY FLUID-BOTTLE: CULTURE: NO GROWTH

## 2016-07-06 LAB — CULTURE, BODY FLUID W GRAM STAIN -BOTTLE

## 2016-07-07 ENCOUNTER — Telehealth: Payer: Self-pay | Admitting: Family Medicine

## 2016-07-07 NOTE — Telephone Encounter (Signed)
Ok to make referral to nutrition for dietary consult with diagnosis of cirrhosis of the liver. He should avoid Tylenol and alcohol his meds are mostly ok we could switch from his low dose allopurinol for the gout to Uloric which is a little safer with cirrhosis

## 2016-07-07 NOTE — Telephone Encounter (Signed)
Pt req referral to dietician for correct foods for liver problems. Per wife cirrhosis of liver. She also thinks we should do a medication review since new dx.Call Pt ph [phone]

## 2016-07-08 ENCOUNTER — Other Ambulatory Visit: Payer: Self-pay | Admitting: Family Medicine

## 2016-07-08 NOTE — Addendum Note (Signed)
Addended by: Sharon Seller B on: 07/08/2016 11:08 AM   Modules accepted: Orders

## 2016-07-08 NOTE — Telephone Encounter (Signed)
Referral entered/called to inform the patient He refused the referral at this time and wants PCP to immediately review his labs/test from the hospital/pulmonary

## 2016-07-08 NOTE — Telephone Encounter (Signed)
I did review the labs, no acute concerns  Not sure what he is asking for. I only referred because he asked for it. I already reviewed his meds. I assume he has a hospital follow up soon

## 2016-07-09 NOTE — Telephone Encounter (Signed)
appt scheduled for 07/30/16

## 2016-07-15 ENCOUNTER — Ambulatory Visit: Payer: Medicare Other | Admitting: Adult Health

## 2016-07-23 ENCOUNTER — Telehealth: Payer: Self-pay | Admitting: Adult Health

## 2016-07-23 NOTE — Telephone Encounter (Signed)
Left a message for the patient to call back to reschedule his appt on 07/26/16 with TP. TP will not be in the office on Monday.

## 2016-07-26 ENCOUNTER — Ambulatory Visit: Payer: Medicare Other | Admitting: Adult Health

## 2016-07-30 ENCOUNTER — Ambulatory Visit (INDEPENDENT_AMBULATORY_CARE_PROVIDER_SITE_OTHER): Payer: Medicare Other | Admitting: Family Medicine

## 2016-07-30 ENCOUNTER — Telehealth: Payer: Self-pay | Admitting: Family Medicine

## 2016-07-30 VITALS — BP 138/68 | HR 57 | Temp 98.0°F | Resp 18 | Wt 163.4 lb

## 2016-07-30 DIAGNOSIS — R1084 Generalized abdominal pain: Secondary | ICD-10-CM

## 2016-07-30 DIAGNOSIS — I10 Essential (primary) hypertension: Secondary | ICD-10-CM | POA: Diagnosis not present

## 2016-07-30 DIAGNOSIS — E039 Hypothyroidism, unspecified: Secondary | ICD-10-CM

## 2016-07-30 DIAGNOSIS — J90 Pleural effusion, not elsewhere classified: Secondary | ICD-10-CM | POA: Diagnosis not present

## 2016-07-30 DIAGNOSIS — K7460 Unspecified cirrhosis of liver: Secondary | ICD-10-CM | POA: Diagnosis not present

## 2016-07-30 DIAGNOSIS — K769 Liver disease, unspecified: Secondary | ICD-10-CM

## 2016-07-30 DIAGNOSIS — D649 Anemia, unspecified: Secondary | ICD-10-CM

## 2016-07-30 DIAGNOSIS — R739 Hyperglycemia, unspecified: Secondary | ICD-10-CM

## 2016-07-30 DIAGNOSIS — I2510 Atherosclerotic heart disease of native coronary artery without angina pectoris: Secondary | ICD-10-CM

## 2016-07-30 LAB — COMPREHENSIVE METABOLIC PANEL
ALT: 26 U/L (ref 0–53)
AST: 34 U/L (ref 0–37)
Albumin: 3.4 g/dL — ABNORMAL LOW (ref 3.5–5.2)
Alkaline Phosphatase: 195 U/L — ABNORMAL HIGH (ref 39–117)
BILIRUBIN TOTAL: 1.1 mg/dL (ref 0.2–1.2)
BUN: 19 mg/dL (ref 6–23)
CO2: 30 meq/L (ref 19–32)
Calcium: 9.2 mg/dL (ref 8.4–10.5)
Chloride: 106 mEq/L (ref 96–112)
Creatinine, Ser: 1.29 mg/dL (ref 0.40–1.50)
GFR: 55.83 mL/min — ABNORMAL LOW (ref >60.00)
GLUCOSE: 108 mg/dL — AB (ref 70–99)
Potassium: 4.5 mEq/L (ref 3.5–5.1)
Sodium: 141 mEq/L (ref 135–145)
Total Protein: 6.5 g/dL (ref 6.0–8.3)

## 2016-07-30 LAB — CBC WITH DIFFERENTIAL/PLATELET
BASOS ABS: 0 10*3/uL (ref 0.0–0.1)
Basophils Relative: 0.4 % (ref 0.0–3.0)
EOS ABS: 0.5 10*3/uL (ref 0.0–0.7)
Eosinophils Relative: 6.9 % — ABNORMAL HIGH (ref 0.0–5.0)
HEMATOCRIT: 44.7 % (ref 39.0–52.0)
Hemoglobin: 15 g/dL (ref 13.0–17.0)
LYMPHS PCT: 14 % (ref 12.0–46.0)
Lymphs Abs: 1.1 10*3/uL (ref 0.7–4.0)
MCHC: 33.6 g/dL (ref 30.0–36.0)
MCV: 97.6 fl (ref 78.0–100.0)
Monocytes Absolute: 0.7 10*3/uL (ref 0.1–1.0)
Monocytes Relative: 9.1 % (ref 3.0–12.0)
NEUTROS ABS: 5.6 10*3/uL (ref 1.4–7.7)
Neutrophils Relative %: 69.6 % (ref 43.0–77.0)
PLATELETS: 148 10*3/uL — AB (ref 150.0–400.0)
RBC: 4.59 Mil/uL (ref 4.22–5.81)
RDW: 13.4 % (ref 11.5–15.5)
WBC: 8 10*3/uL (ref 4.0–10.5)

## 2016-07-30 LAB — GAMMA GT: GGT: 133 U/L — AB (ref 7–51)

## 2016-07-30 LAB — FERRITIN: FERRITIN: 82.8 ng/mL (ref 22.0–322.0)

## 2016-07-30 NOTE — Progress Notes (Signed)
Subjective:  I acted as a Education administrator for Dr. Charlett Blake. [place], [place]  Patient ID: Fernando Liu, male    DOB: Jul 04, 1926, 83 y.o.   MRN: [id number]  No chief complaint on file.   HPI  Patient is in today for a 2 month follow up. He is accompanied by his wife and they concerned about his recent pleural effusion and his liver disease. Imaging of liver suggested a liver mass and they await MRI to confirm which is ordered and does not show any lesion. He has cut down on his alcohol intake and he notes now he only drinks less than an oz of liquor and not daily. He still endorses anorexia but denies nausea,, vomiting, abdominal pain. Denies CP/palp/HA/congestion/fevers/GI or GU c/o. Taking meds as prescribed  Patient Care Team: Mosie Lukes, MD as PCP - General (Family Medicine) Becky Sax, MD as Referring Physician (Orthopedic Surgery) Noralyn Pick, MD as Consulting Physician (Ophthalmology)   Past Medical History:  Diagnosis Date  . Anemia 12/28/2015  . CAD (coronary artery disease)   . Chicken pox 83 yrs old  . Cirrhosis of liver ([place]) 06/29/2016  . Decreased breath sounds 05/27/2016  . Gout 04/24/2015  . High cholesterol   . Hypertension   . Measles as a child  . Medicare annual wellness visit, subsequent 10/31/2012   Living will wife and daughter have a copy, does not want heroic measures Sees cardiology his recent cardiologist is retiring his new doctor will be at [employer]   . Neuropathy   . Osteoporosis 12/01/2015  . Pleural effusion on right 08/01/2016  . Preventative health care 10/31/2012  . Thyroid disease     Past Surgical History:  Procedure Laterality Date  . CATARACT EXTRACTION, BILATERAL     August & November 2012  . FOOT SURGERY Left   . HERNIA REPAIR    . PELVIC ABCESS DRAINAGE     states surgery affected control of BM  . TONSILLECTOMY      Family History  Problem Relation Age of Onset  . Stroke Mother   . Hypertension Mother   . Cancer Maternal  Grandmother   . Heart disease Father   . Cancer Daughter   . Breast cancer Neg Hx   . Diabetes Neg Hx   . Colon cancer Neg Hx   . Prostate cancer Neg Hx     Social History   Social History  . Marital status: Married    Spouse name: N/A  . Number of children: N/A  . Years of education: N/A   Occupational History  . Semi-retired Chief Financial Officer    Social History Main Topics  . Smoking status: Never Smoker  . Smokeless tobacco: Never Used  . Alcohol use 1.2 oz/week    2 Shots of liquor per week     Comment: daily  . Drug use: No  . Sexual activity: Not on file   Other Topics Concern  . Not on file   Social History Narrative  . No narrative on file    Outpatient Medications Prior to Visit  Medication Sig Dispense Refill  . allopurinol (ZYLOPRIM) 100 MG tablet TAKE 1 TABLET (100 MG TOTAL) BY MOUTH DAILY. 30 tablet 6  . atorvastatin (LIPITOR) 10 MG tablet TAKE 1 TABLET BY MOUTH AT BEDTIME 30 tablet 4  . b complex vitamins tablet Take 1 tablet by mouth daily.    . clopidogrel (PLAVIX) 75 MG tablet Take 75 mg by mouth daily.     Marland Kitchen  Ferrous Fumarate (HEMOCYTE) 324 (106 Fe) MG TABS tablet Take 1 tablet (106 mg of iron total) by mouth daily. Iron supplement for anemia 30 tablet 2  . hydrocortisone (ANUSOL-HC) 25 MG suppository Place 1 suppository (25 mg total) rectally at bedtime as needed for hemorrhoids or itching. 12 suppository 0  . IRON PO Take by mouth.    . levothyroxine (SYNTHROID, LEVOTHROID) 75 MCG tablet TAKE 1 TABLET BY MOUTH EVERY DAY 30 tablet 4  . MICRONIZED COLESTIPOL HCL 1 G tablet Take 1 g by mouth 2 (two) times daily.  2  . nitroGLYCERIN (NITROSTAT) 0.4 MG SL tablet Place 0.4 mg under the tongue every 5 (five) minutes as needed. For chest pain    . ranitidine (ZANTAC) 150 MG capsule Take 1 capsule (150 mg total) by mouth every evening. For acid protection 30 capsule 2  . metoprolol tartrate (LOPRESSOR) 25 MG tablet Take 0.5 tablets (12.5 mg total) by mouth 2 (two)  times daily. Take 1/2 a tablet twice a day (Patient not taking: Reported on 06/24/2016) 30 tablet 4   No facility-administered medications prior to visit.     Allergies  Allergen Reactions  . Aspirin Swelling    Review of Systems  Constitutional: Positive for malaise/fatigue. Negative for fever.  HENT: Negative for congestion.   Eyes: Negative for blurred vision.  Respiratory: Negative for shortness of breath.   Cardiovascular: Negative for chest pain, palpitations and leg swelling.  Gastrointestinal: Negative for abdominal pain, blood in stool and nausea.  Genitourinary: Negative for dysuria and frequency.  Musculoskeletal: Negative for falls.  Skin: Negative for rash.  Neurological: Negative for dizziness, loss of consciousness and headaches.  Endo/Heme/Allergies: Negative for environmental allergies.  Psychiatric/Behavioral: Negative for depression. The patient is not nervous/anxious.        Objective:    Physical Exam  Constitutional: He is oriented to person, place, and time. He appears well-developed and well-nourished. No distress.  HENT:  Head: Normocephalic and atraumatic.  Nose: Nose normal.  Eyes: Right eye exhibits no discharge. Left eye exhibits no discharge.  Neck: Normal range of motion. Neck supple.  Cardiovascular: Normal rate and regular rhythm.   No murmur heard. Pulmonary/Chest: Effort normal and breath sounds normal.  Abdominal: Soft. Bowel sounds are normal. He exhibits distension. There is no tenderness. There is no rebound and no guarding.  Hepatic margin extends 2-3 Finger breadths below costal margins.   Musculoskeletal: He exhibits no edema.  Neurological: He is alert and oriented to person, place, and time.  Skin: Skin is warm and dry.  Psychiatric: He has a normal mood and affect.  Nursing note and vitals reviewed.   BP 138/68 (BP Location: Left Arm, Patient Position: Sitting, Cuff Size: Normal)   Pulse (!) 57   Temp 98 F (36.7 C) (Oral)    Resp 18   Wt 163 lb 6.4 oz (74.1 kg)   SpO2 98%   BMI 25.21 kg/m  Wt Readings from Last 3 Encounters:  07/30/16 163 lb 6.4 oz (74.1 kg)  06/24/16 163 lb (73.9 kg)  06/07/16 165 lb (74.8 kg)   BP Readings from Last 3 Encounters:  07/30/16 138/68  07/01/16 (!) 135/42  06/24/16 138/72     Immunization History  Administered Date(s) Administered  . Pneumococcal Conjugate-13 10/31/2012  . Td 01/21/2011    Health Maintenance  Topic Date Due  . PNA vac Low Risk Adult (2 of 2 - PPSV23) 08/27/2016 (Originally 10/31/2013)  . INFLUENZA VACCINE  08/25/2016  . TETANUS/TDAP  01/20/2021    Lab Results  Component Value Date   WBC 8.0 07/30/2016   HGB 15.0 07/30/2016   HCT 44.7 07/30/2016   PLT 148.0 (L) 07/30/2016   GLUCOSE 108 (H) 07/30/2016   CHOL 113 05/07/2016   TRIG 65.0 05/07/2016   HDL 52.60 05/07/2016   LDLCALC 47 05/07/2016   ALT 26 07/30/2016   AST 34 07/30/2016   NA 141 07/30/2016   K 4.5 07/30/2016   CL 106 07/30/2016   CREATININE 1.29 07/30/2016   BUN 19 07/30/2016   CO2 30 07/30/2016   TSH 4.09 05/27/2016   PSA 0.62 04/30/2013   HGBA1C 5.2 05/07/2016    Lab Results  Component Value Date   TSH 4.09 05/27/2016   Lab Results  Component Value Date   WBC 8.0 07/30/2016   HGB 15.0 07/30/2016   HCT 44.7 07/30/2016   MCV 97.6 07/30/2016   PLT 148.0 (L) 07/30/2016   Lab Results  Component Value Date   NA 141 07/30/2016   K 4.5 07/30/2016   CO2 30 07/30/2016   GLUCOSE 108 (H) 07/30/2016   BUN 19 07/30/2016   CREATININE 1.29 07/30/2016   BILITOT 1.1 07/30/2016   ALKPHOS 195 (H) 07/30/2016   AST 34 07/30/2016   ALT 26 07/30/2016   PROT 6.5 07/30/2016   ALBUMIN 3.4 (L) 07/30/2016   CALCIUM 9.2 07/30/2016   GFR 55.83 (L) 07/30/2016   Lab Results  Component Value Date   CHOL 113 05/07/2016   Lab Results  Component Value Date   HDL 52.60 05/07/2016   Lab Results  Component Value Date   LDLCALC 47 05/07/2016   Lab Results  Component Value  Date   TRIG 65.0 05/07/2016   Lab Results  Component Value Date   CHOLHDL 2 05/07/2016   Lab Results  Component Value Date   HGBA1C 5.2 05/07/2016         Assessment & Plan:   Problem List Items Addressed This Visit    HTN (hypertension)    Well controlled, no changes to meds. Encouraged heart healthy diet such as the DASH diet and exercise as tolerated.       Hypothyroidism    On Levothyroxine, continue to monitor      Hyperglycemia    minimize simple carbs. Increase exercise as tolerated.       Anemia   Relevant Orders   Ferritin (Completed)   CBC w/Diff (Completed)   Cirrhosis of liver (HCC)    Avoid alcohol and tylenol. MRI was ordered secondary to the concerns of a concerning lesion in the liver but the MRI was negative for any mass in the liver. Referred to gastroenterology for further consideration.      Relevant Orders   MR LIVER W WO CONTRAST (Completed)   Gamma GT (Completed)   Comp Met (CMET) (Completed)   Pleural effusion on right    MRI shows it has recurred and is moderate in size referred back to pulmonology for further management. Started on spironolactone 25 mg tabs daily       Other Visit Diagnoses    Liver lesion    -  Primary   Relevant Orders   MR LIVER W WO CONTRAST (Completed)   Gamma GT (Completed)   Comp Met (CMET) (Completed)   Generalized abdominal pain       Relevant Orders   Gamma GT (Completed)   Comp Met (CMET) (Completed)      I have discontinued Mr. Axe's metoprolol tartrate.  I am also having him maintain his clopidogrel, nitroGLYCERIN, MICRONIZED COLESTIPOL HCL, hydrocortisone, Ferrous Fumarate, ranitidine, b complex vitamins, IRON PO, atorvastatin, levothyroxine, and allopurinol.  No orders of the defined types were placed in this encounter.   CMA served as Education administrator during this visit. History, Physical and Plan performed by medical provider. Documentation and orders reviewed and attested to.  Penni Homans, MD

## 2016-07-30 NOTE — Telephone Encounter (Signed)
Left patient a message regarding Health care POA.  Patient will need to complete sign it again.

## 2016-07-30 NOTE — Patient Instructions (Signed)
Cirrhosis Cirrhosis is long-term (chronic) liver injury. The liver is your largest internal organ, and it performs many functions. The liver converts food into energy, removes toxic material from your blood, makes important proteins, and absorbs necessary vitamins from your diet. If you have cirrhosis, it means many of your healthy liver cells have been replaced by scar tissue. This prevents blood from flowing through your liver, which makes it difficult for your liver to function. This scarring is not reversible, but treatment can prevent it from getting worse. What are the causes? Hepatitis C and long-term alcohol abuse are the most common causes of cirrhosis. Other causes include:  Nonalcoholic fatty liver disease.  Hepatitis B infection.  Autoimmune hepatitis.  Diseases that cause blockage of ducts inside the liver.  Inherited liver diseases.  Reactions to certain long-term medicines.  Parasitic infections.  Long-term exposure to certain toxins.  What increases the risk? You may have a higher risk of cirrhosis if you:  Have certain hepatitis viruses.  Abuse alcohol, especially if you are male.  Are overweight.  Share needles.  Have unprotected sex with someone who has hepatitis.  What are the signs or symptoms? You may not have any signs and symptoms at first. Symptoms may not develop until the damage to your liver starts to get worse. Signs and symptoms of cirrhosis may include:  Tenderness in the right-upper part of your abdomen.  Weakness and tiredness (fatigue).  Loss of appetite.  Nausea.  Weight loss and muscle loss.  Itchiness.  Yellow skin and eyes (jaundice).  Buildup of fluid in the abdomen (ascites).  Swelling of the feet and ankles (edema).  Appearance of tiny blood vessels under the skin.  Mental confusion.  Easy bruising and bleeding.  How is this diagnosed? Your health care provider may suspect cirrhosis based on your symptoms and  medical history, especially if you have other medical conditions or a history of alcohol abuse. Your health care provider will do a physical exam to feel your liver and check for signs of cirrhosis. Your health care provider may perform other tests, including:  Blood tests to check: ? Whether you have hepatitis B or C. ? Kidney function. ? Liver function.  Imaging tests such as: ? MRI or CT scan to look for changes seen in advanced cirrhosis. ? Ultrasound to see if normal liver tissue is being replaced by scar tissue.  A procedure using a long needle to take a sample of liver tissue (biopsy) for examination under a microscope. Liver biopsy can confirm the diagnosis of cirrhosis.  How is this treated? Treatment depends on how damaged your liver is and what caused the damage. Treatment may include treating cirrhosis symptoms or treating the underlying causes of the condition to try to slow the progression of the damage. Treatment may include:  Making lifestyle changes, such as: ? Eating a healthy diet. ? Restricting salt intake. ? Maintaining a healthy weight. ? Not abusing drugs or alcohol.  Taking medicines to: ? Treat liver infections or other infections. ? Control itching. ? Reduce fluid buildup. ? Reduce certain blood toxins. ? Reduce risk of bleeding from enlarged blood vessels in the stomach or esophagus (varices).  If varices are causing bleeding problems, you may need treatment with a procedure that ties up the vessels causing them to fall off (band ligation).  If cirrhosis is causing your liver to fail, your health care provider may recommend a liver transplant.  Other treatments may be recommended depending on any complications   of cirrhosis, such as liver-related kidney failure (hepatorenal syndrome).  Follow these instructions at home:  Take medicines only as directed by your health care provider. Do not use drugs that are toxic to your liver. Ask your health care  provider before taking any new medicines, including over-the-counter medicines.  Rest as needed.  Eat a well-balanced diet. Ask your health care provider or dietitian for more information.  You may have to follow a low-salt diet or restrict your water intake as directed.  Do not drink alcohol. This is especially important if you are taking acetaminophen.  Keep all follow-up visits as directed by your health care provider. This is important. Contact a health care provider if:  You have fatigue or weakness that is getting worse.  You develop swelling of the hands, feet, legs, or face.  You have a fever.  You develop loss of appetite.  You have nausea or vomiting.  You develop jaundice.  You develop easy bruising or bleeding. Get help right away if:  You vomit bright red blood or a material that looks like coffee grounds.  You have blood in your stools.  Your stools appear black and tarry.  You become confused.  You have chest pain or trouble breathing. This information is not intended to replace advice given to you by your health care provider. Make sure you discuss any questions you have with your health care provider. Document Released: 01/11/2005 Document Revised: 05/22/2015 Document Reviewed: 09/19/2013 Elsevier Interactive Patient Education  2018 [employer]  

## 2016-07-31 ENCOUNTER — Ambulatory Visit (HOSPITAL_BASED_OUTPATIENT_CLINIC_OR_DEPARTMENT_OTHER)
Admission: RE | Admit: 2016-07-31 | Discharge: 2016-07-31 | Disposition: A | Discharge status: Home / Self Care | Payer: Medicare Other | Source: Ambulatory Visit | Attending: Family Medicine | Admitting: Family Medicine

## 2016-07-31 DIAGNOSIS — I700 Atherosclerosis of aorta: Secondary | ICD-10-CM | POA: Insufficient documentation

## 2016-07-31 DIAGNOSIS — K7460 Unspecified cirrhosis of liver: Secondary | ICD-10-CM | POA: Insufficient documentation

## 2016-07-31 DIAGNOSIS — J90 Pleural effusion, not elsewhere classified: Secondary | ICD-10-CM | POA: Insufficient documentation

## 2016-07-31 DIAGNOSIS — K7689 Other specified diseases of liver: Secondary | ICD-10-CM | POA: Diagnosis not present

## 2016-07-31 DIAGNOSIS — K766 Portal hypertension: Secondary | ICD-10-CM | POA: Insufficient documentation

## 2016-07-31 DIAGNOSIS — K769 Liver disease, unspecified: Secondary | ICD-10-CM

## 2016-07-31 DIAGNOSIS — R161 Splenomegaly, not elsewhere classified: Secondary | ICD-10-CM | POA: Insufficient documentation

## 2016-07-31 MED ORDER — GADOBENATE DIMEGLUMINE 529 MG/ML IV SOLN
15.0000 mL | Freq: Once | INTRAVENOUS | Status: AC | PRN
  Administered 2016-07-31: 14 mL via INTRAVENOUS

## 2016-08-01 ENCOUNTER — Other Ambulatory Visit: Payer: Self-pay | Admitting: Family Medicine

## 2016-08-01 ENCOUNTER — Encounter: Payer: Self-pay | Admitting: Family Medicine

## 2016-08-01 DIAGNOSIS — J90 Pleural effusion, not elsewhere classified: Secondary | ICD-10-CM

## 2016-08-01 DIAGNOSIS — K7460 Unspecified cirrhosis of liver: Secondary | ICD-10-CM

## 2016-08-01 DIAGNOSIS — R188 Other ascites: Principal | ICD-10-CM

## 2016-08-01 HISTORY — DX: Pleural effusion, not elsewhere classified: J90

## 2016-08-01 MED ORDER — SPIRONOLACTONE 25 MG PO TABS: 25.0000 mg | ORAL_TABLET | Freq: Every day | ORAL | 1 refills | Status: DC

## 2016-08-01 NOTE — Assessment & Plan Note (Signed)
Avoid alcohol and tylenol. MRI was ordered secondary to the concerns of a concerning lesion in the liver but the MRI was negative for any mass in the liver. Referred to gastroenterology for further consideration.

## 2016-08-01 NOTE — Assessment & Plan Note (Signed)
On Levothyroxine, continue to monitor 

## 2016-08-01 NOTE — Assessment & Plan Note (Signed)
Well controlled, no changes to meds. Encouraged heart healthy diet such as the DASH diet and exercise as tolerated.  °

## 2016-08-01 NOTE — Assessment & Plan Note (Signed)
minimize simple carbs. Increase exercise as tolerated.  

## 2016-08-01 NOTE — Assessment & Plan Note (Signed)
MRI shows it has recurred and is moderate in size referred back to pulmonology for further management. Started on spironolactone 25 mg tabs daily

## 2016-08-09 ENCOUNTER — Ambulatory Visit (INDEPENDENT_AMBULATORY_CARE_PROVIDER_SITE_OTHER): Payer: Medicare Other | Admitting: Family Medicine

## 2016-08-09 ENCOUNTER — Encounter: Payer: Self-pay | Admitting: Family Medicine

## 2016-08-09 VITALS — BP 118/58 | HR 64 | Temp 98.1°F | Resp 18 | Wt 159.8 lb

## 2016-08-09 DIAGNOSIS — I2510 Atherosclerotic heart disease of native coronary artery without angina pectoris: Secondary | ICD-10-CM

## 2016-08-09 DIAGNOSIS — K7030 Alcoholic cirrhosis of liver without ascites: Secondary | ICD-10-CM | POA: Diagnosis not present

## 2016-08-09 DIAGNOSIS — E039 Hypothyroidism, unspecified: Secondary | ICD-10-CM | POA: Diagnosis not present

## 2016-08-09 DIAGNOSIS — I10 Essential (primary) hypertension: Secondary | ICD-10-CM | POA: Diagnosis not present

## 2016-08-09 DIAGNOSIS — J90 Pleural effusion, not elsewhere classified: Secondary | ICD-10-CM | POA: Diagnosis not present

## 2016-08-09 DIAGNOSIS — D649 Anemia, unspecified: Secondary | ICD-10-CM | POA: Diagnosis not present

## 2016-08-09 LAB — COMPREHENSIVE METABOLIC PANEL
ALT: 26 U/L (ref 0–53)
AST: 34 U/L (ref 0–37)
Albumin: 3.3 g/dL — ABNORMAL LOW (ref 3.5–5.2)
Alkaline Phosphatase: 160 U/L — ABNORMAL HIGH (ref 39–117)
BILIRUBIN TOTAL: 0.9 mg/dL (ref 0.2–1.2)
BUN: 18 mg/dL (ref 6–23)
CHLORIDE: 106 meq/L (ref 96–112)
CO2: 28 meq/L (ref 19–32)
CREATININE: 1.26 mg/dL (ref 0.40–1.50)
Calcium: 9.1 mg/dL (ref 8.4–10.5)
GFR: 57.36 mL/min — ABNORMAL LOW (ref >60.00)
GLUCOSE: 97 mg/dL (ref 70–99)
Potassium: 4.3 mEq/L (ref 3.5–5.1)
SODIUM: 139 meq/L (ref 135–145)
Total Protein: 6.2 g/dL (ref 6.0–8.3)

## 2016-08-09 LAB — CBC
HEMATOCRIT: 43.5 % (ref 39.0–52.0)
Hemoglobin: 14.6 g/dL (ref 13.0–17.0)
MCHC: 33.6 g/dL (ref 30.0–36.0)
MCV: 96.8 fl (ref 78.0–100.0)
Platelets: 176 10*3/uL (ref 150.0–400.0)
RBC: 4.5 Mil/uL (ref 4.22–5.81)
RDW: 13.8 % (ref 11.5–15.5)
WBC: 7.2 10*3/uL (ref 4.0–10.5)

## 2016-08-09 NOTE — Patient Instructions (Signed)
Ascites °Ascites is a collection of excess fluid in the abdomen. Ascites can range from mild to severe. It can get worse without treatment. °What are the causes? °Possible causes include: °· Cirrhosis. This is the most common cause of ascites. °· Infection or inflammation in the abdomen. °· Cancer in the abdomen. °· Heart failure. °· Kidney disease. °· Inflammation of the pancreas. °· Clots in the veins of the liver. ° °What are the signs or symptoms? °Signs and symptoms may include: °· A feeling of fullness in your abdomen. This is common. °· An increase in the size of your abdomen or your waist. °· Swelling in your legs. °· Swelling of the scrotum in men. °· Difficulty breathing. °· Abdominal pain. °· Sudden weight gain. ° °If the condition is mild, you may not have symptoms. °How is this diagnosed? °To make a diagnosis, your health care provider will: °· Ask about your medical history. °· Perform a physical exam. °· Order imaging tests, such as an ultrasound or CT scan of your abdomen. ° °How is this treated? °Treatment depends on the cause of the ascites. It may include: °· Taking a pill to make you urinate. This is called a water pill (diuretic pill). °· Strictly reducing your salt (sodium) intake. Salt can cause extra fluid to be kept in the body, and this makes ascites worse. °· Having a procedure to remove fluid from your abdomen (paracentesis). °· Having a procedure to transfer fluid from your abdomen into a vein. °· Having a procedure that connects two of the major veins within your liver and relieves pressure on your liver (TIPS procedure). ° °Ascites may go away or improve with treatment of the condition that caused it. °Follow these instructions at home: °· Keep track of your weight. To do this, weigh yourself at the same time every day and record your weight. °· Keep track of how much you drink and any changes in the amount you urinate. °· Follow any instructions that your health care provider gives  you about how much to drink. °· Try not to eat salty (high-sodium) foods. °· Take medicines only as directed by your health care provider. °· Keep all follow-up visits as directed by your health care provider. This is important. °· Report any changes in your health to your health care provider, especially if you develop new symptoms or your symptoms get worse. °Contact a health care provider if: °· Your gain more than 3 pounds in 3 days. °· Your abdominal size or your waist size increases. °· You have new swelling in your legs. °· The swelling in your legs gets worse. °Get help right away if: °· You develop a fever. °· You develop confusion. °· You develop new or worsening difficulty breathing. °· You develop new or worsening abdominal pain. °· You develop new or worsening swelling in the scrotum (in men). °This information is not intended to replace advice given to you by your health care provider. Make sure you discuss any questions you have with your health care provider. °Document Released: 01/11/2005 Document Revised: 05/21/2015 Document Reviewed: 08/10/2013 °Elsevier Interactive Patient Education © 2018 [employer] ° °

## 2016-08-09 NOTE — Assessment & Plan Note (Signed)
No new symptoms and is tolerating Spironolactone has an appointment with pulmonology this week

## 2016-08-09 NOTE — Assessment & Plan Note (Signed)
Well controlled, no changes to meds. Encouraged heart healthy diet such as the DASH diet and exercise as tolerated.  °

## 2016-08-09 NOTE — Assessment & Plan Note (Signed)
On Levothyroxine, continue to monitor 

## 2016-08-09 NOTE — Progress Notes (Signed)
Subjective:  I acted as a Education administrator for Dr. Charlett Blake. Fernando Liu, [place]  Patient ID: Fernando Liu, male    DOB: 08/12/1926, 83 y.o.   MRN: [id number]  No chief complaint on file.   HPI  Patient is in today for a follow up and is accompanied by his wife. He feels a his baseline today. No new symptoms or trouble with the Spironolactone. He is eating but still lacks an appetite. No diarrhea and has stopped the Colestid. No recent febrile illness. He has gotten the records from his old GI Doc who has left his previous practice and will start with [facility] soon. Has an appt with pulmonology this week. Denies CP/palp/SOB/HA/congestion/fever or GU c/o. Taking meds as prescribed  Patient Care Team: Mosie Lukes, MD as PCP - General (Family Medicine) Becky Sax, MD as Referring Physician (Orthopedic Surgery) Noralyn Pick, MD as Consulting Physician (Ophthalmology)   Past Medical History:  Diagnosis Date  . Anemia 12/28/2015  . CAD (coronary artery disease)   . Chicken pox 83 yrs old  . Cirrhosis of liver ([place]) 06/29/2016  . Decreased breath sounds 05/27/2016  . Gout 04/24/2015  . High cholesterol   . Hypertension   . Measles as a child  . Medicare annual wellness visit, subsequent 10/31/2012   Living will wife and daughter have a copy, does not want heroic measures Sees cardiology his recent cardiologist is retiring his new doctor will be at [employer]   . Neuropathy   . Osteoporosis 12/01/2015  . Pleural effusion on right 08/01/2016  . Preventative health care 10/31/2012  . Thyroid disease     Past Surgical History:  Procedure Laterality Date  . CATARACT EXTRACTION, BILATERAL     August & November 2012  . FOOT SURGERY Left   . HERNIA REPAIR    . PELVIC ABCESS DRAINAGE     states surgery affected control of BM  . TONSILLECTOMY      Family History  Problem Relation Age of Onset  . Stroke Mother   . Hypertension Mother   . Cancer Maternal Grandmother   . Heart disease Father   . Cancer  Daughter   . Breast cancer Neg Hx   . Diabetes Neg Hx   . Colon cancer Neg Hx   . Prostate cancer Neg Hx     Social History   Social History  . Marital status: Married    Spouse name: N/A  . Number of children: N/A  . Years of education: N/A   Occupational History  . Semi-retired Chief Financial Officer    Social History Main Topics  . Smoking status: Never Smoker  . Smokeless tobacco: Never Used  . Alcohol use 1.2 oz/week    2 Shots of liquor per week     Comment: daily  . Drug use: No  . Sexual activity: Not on file   Other Topics Concern  . Not on file   Social History Narrative  . No narrative on file    Outpatient Medications Prior to Visit  Medication Sig Dispense Refill  . allopurinol (ZYLOPRIM) 100 MG tablet TAKE 1 TABLET (100 MG TOTAL) BY MOUTH DAILY. 30 tablet 6  . atorvastatin (LIPITOR) 10 MG tablet TAKE 1 TABLET BY MOUTH AT BEDTIME 30 tablet 4  . b complex vitamins tablet Take 1 tablet by mouth daily.    Marland Kitchen levothyroxine (SYNTHROID, LEVOTHROID) 75 MCG tablet TAKE 1 TABLET BY MOUTH EVERY DAY 30 tablet 4  . MICRONIZED COLESTIPOL HCL 1 G tablet  Take 1 g by mouth 2 (two) times daily.  2  . nitroGLYCERIN (NITROSTAT) 0.4 MG SL tablet Place 0.4 mg under the tongue every 5 (five) minutes as needed. For chest pain    . ranitidine (ZANTAC) 150 MG capsule Take 1 capsule (150 mg total) by mouth every evening. For acid protection 30 capsule 2  . spironolactone (ALDACTONE) 25 MG tablet Take 1 tablet (25 mg total) by mouth daily. 30 tablet 1  . clopidogrel (PLAVIX) 75 MG tablet Take 75 mg by mouth daily.     . Ferrous Fumarate (HEMOCYTE) 324 (106 Fe) MG TABS tablet Take 1 tablet (106 mg of iron total) by mouth daily. Iron supplement for anemia 30 tablet 2  . hydrocortisone (ANUSOL-HC) 25 MG suppository Place 1 suppository (25 mg total) rectally at bedtime as needed for hemorrhoids or itching. 12 suppository 0  . IRON PO Take by mouth.     No facility-administered medications prior to  visit.     Allergies  Allergen Reactions  . Aspirin Swelling    Review of Systems  Constitutional: Negative for fever and malaise/fatigue.  HENT: Negative for congestion.   Eyes: Negative for blurred vision.  Respiratory: Negative for cough and shortness of breath.   Cardiovascular: Negative for chest pain, palpitations and leg swelling.  Gastrointestinal: Negative for vomiting.  Musculoskeletal: Negative for back pain.  Skin: Negative for rash.  Neurological: Negative for loss of consciousness and headaches.       Objective:    Physical Exam  Constitutional: He is oriented to person, place, and time. He appears well-developed and well-nourished. No distress.  HENT:  Head: Normocephalic and atraumatic.  Eyes: Conjunctivae are normal.  Neck: Normal range of motion. No thyromegaly present.  Cardiovascular: Normal rate and regular rhythm.   Pulmonary/Chest: Effort normal and breath sounds normal. He has no wheezes.  Diminished BS RLL  Abdominal: Soft. Bowel sounds are normal. There is no tenderness.  Musculoskeletal: Normal range of motion. He exhibits no edema or deformity.  Neurological: He is alert and oriented to person, place, and time.  Skin: Skin is warm and dry. He is not diaphoretic.  Psychiatric: He has a normal mood and affect.    BP (!) 118/58 (BP Location: Left Arm, Patient Position: Sitting, Cuff Size: Normal)   Pulse 64   Temp 98.1 F (36.7 C) (Oral)   Resp 18   Wt 159 lb 12.8 oz (72.5 kg)   SpO2 97%   BMI 24.66 kg/m  Wt Readings from Last 3 Encounters:  08/09/16 159 lb 12.8 oz (72.5 kg)  07/30/16 163 lb 6.4 oz (74.1 kg)  06/24/16 163 lb (73.9 kg)   BP Readings from Last 3 Encounters:  08/09/16 (!) 118/58  07/30/16 138/68  07/01/16 (!) 135/42     Immunization History  Administered Date(s) Administered  . Pneumococcal Conjugate-13 10/31/2012  . Td 01/21/2011    Health Maintenance  Topic Date Due  . PNA vac Low Risk Adult (2 of 2 - PPSV23)  08/27/2016 (Originally 10/31/2013)  . INFLUENZA VACCINE  08/25/2016  . TETANUS/TDAP  01/20/2021    Lab Results  Component Value Date   WBC 8.0 07/30/2016   HGB 15.0 07/30/2016   HCT 44.7 07/30/2016   PLT 148.0 (L) 07/30/2016   GLUCOSE 108 (H) 07/30/2016   CHOL 113 05/07/2016   TRIG 65.0 05/07/2016   HDL 52.60 05/07/2016   LDLCALC 47 05/07/2016   ALT 26 07/30/2016   AST 34 07/30/2016   NA 141  07/30/2016   K 4.5 07/30/2016   CL 106 07/30/2016   CREATININE 1.29 07/30/2016   BUN 19 07/30/2016   CO2 30 07/30/2016   TSH 4.09 05/27/2016   PSA 0.62 04/30/2013   HGBA1C 5.2 05/07/2016    Lab Results  Component Value Date   TSH 4.09 05/27/2016   Lab Results  Component Value Date   WBC 8.0 07/30/2016   HGB 15.0 07/30/2016   HCT 44.7 07/30/2016   MCV 97.6 07/30/2016   PLT 148.0 (L) 07/30/2016   Lab Results  Component Value Date   NA 141 07/30/2016   K 4.5 07/30/2016   CO2 30 07/30/2016   GLUCOSE 108 (H) 07/30/2016   BUN 19 07/30/2016   CREATININE 1.29 07/30/2016   BILITOT 1.1 07/30/2016   ALKPHOS 195 (H) 07/30/2016   AST 34 07/30/2016   ALT 26 07/30/2016   PROT 6.5 07/30/2016   ALBUMIN 3.4 (L) 07/30/2016   CALCIUM 9.2 07/30/2016   GFR 55.83 (L) 07/30/2016   Lab Results  Component Value Date   CHOL 113 05/07/2016   Lab Results  Component Value Date   HDL 52.60 05/07/2016   Lab Results  Component Value Date   LDLCALC 47 05/07/2016   Lab Results  Component Value Date   TRIG 65.0 05/07/2016   Lab Results  Component Value Date   CHOLHDL 2 05/07/2016   Lab Results  Component Value Date   HGBA1C 5.2 05/07/2016         Assessment & Plan:   Problem List Items Addressed This Visit    HTN (hypertension)    Well controlled, no changes to meds. Encouraged heart healthy diet such as the DASH diet and exercise as tolerated.       Relevant Orders   Comprehensive metabolic panel   Hypothyroidism    On Levothyroxine, continue to monitor      Anemia -  Primary   Relevant Orders   CBC   Cirrhosis of liver ([place])    Was started on Spironolactone last week. Has lost  3-4 pounds with initiation and has had no concerning side effects check labs today. Have arranged for evaluation with [facility]      Pleural effusion on right    No new symptoms and is tolerating Spironolactone has an appointment with pulmonology this week         I have discontinued Mr. Vanderbeck's hydrocortisone, Ferrous Fumarate, and IRON PO. I am also having him maintain his clopidogrel, nitroGLYCERIN, MICRONIZED COLESTIPOL HCL, ranitidine, b complex vitamins, atorvastatin, levothyroxine, allopurinol, spironolactone, and cholecalciferol.  Meds ordered this encounter  Medications  . cholecalciferol (VITAMIN D) 1000 units tablet    Sig: Take 1,000 Units by mouth daily.    CMA served as Education administrator during this visit. History, Physical and Plan performed by medical provider. Documentation and orders reviewed and attested to.  Penni Homans, MD

## 2016-08-09 NOTE — Assessment & Plan Note (Addendum)
Was started on Spironolactone last week. Has lost  3-4 pounds with initiation and has had no concerning side effects check labs today. Have arranged for evaluation with [facility]

## 2016-08-12 ENCOUNTER — Encounter: Payer: Self-pay | Admitting: Pulmonary Disease

## 2016-08-12 ENCOUNTER — Ambulatory Visit (INDEPENDENT_AMBULATORY_CARE_PROVIDER_SITE_OTHER): Payer: Medicare Other | Admitting: Pulmonary Disease

## 2016-08-12 VITALS — BP 120/80 | HR 69 | Ht 68.0 in | Wt 155.6 lb

## 2016-08-12 DIAGNOSIS — K7031 Alcoholic cirrhosis of liver with ascites: Secondary | ICD-10-CM | POA: Diagnosis not present

## 2016-08-12 DIAGNOSIS — J90 Pleural effusion, not elsewhere classified: Secondary | ICD-10-CM

## 2016-08-12 DIAGNOSIS — I2510 Atherosclerotic heart disease of native coronary artery without angina pectoris: Secondary | ICD-10-CM | POA: Diagnosis not present

## 2016-08-12 NOTE — Patient Instructions (Signed)
Follow up as needed

## 2016-08-12 NOTE — Progress Notes (Signed)
Current Outpatient Prescriptions on File Prior to Visit  Medication Sig  . allopurinol (ZYLOPRIM) 100 MG tablet TAKE 1 TABLET (100 MG TOTAL) BY MOUTH DAILY.  Marland Kitchen atorvastatin (LIPITOR) 10 MG tablet TAKE 1 TABLET BY MOUTH AT BEDTIME  . b complex vitamins tablet Take 1 tablet by mouth daily.  . cholecalciferol (VITAMIN D) 1000 units tablet Take 1,000 Units by mouth daily.  . clopidogrel (PLAVIX) 75 MG tablet Take 75 mg by mouth daily.   Marland Kitchen levothyroxine (SYNTHROID, LEVOTHROID) 75 MCG tablet TAKE 1 TABLET BY MOUTH EVERY DAY  . MICRONIZED COLESTIPOL HCL 1 G tablet Take 1 g by mouth 2 (two) times daily.  . nitroGLYCERIN (NITROSTAT) 0.4 MG SL tablet Place 0.4 mg under the tongue every 5 (five) minutes as needed. For chest pain  . ranitidine (ZANTAC) 150 MG capsule Take 1 capsule (150 mg total) by mouth every evening. For acid protection  . spironolactone (ALDACTONE) 25 MG tablet Take 1 tablet (25 mg total) by mouth daily.   No current facility-administered medications on file prior to visit.      Chief Complaint  Patient presents with  . Follow-up    Pt has some congestion as noted by Dr. Randel Pigg; no other concerns noted by pt.      Pulmonary tests CT chest 06/25/16 >> atherosclerosis, moderate Rt pleural effusion, severe changes of liver cirrhosis with portal venous hypertension and splenomegaly, enhancing focus in Lt lobe of liver ?[place] Rt thoracentesis 07/01/16 >> 1.7 liters, protein 3.5, LDH 73, glucose 94, WBC 292 (4N, 50L, 64M, 15E), cytology negative  Past medical history  Past surgical history, Family history, Social history, Allergies all reviewed.  Vital Signs BP 120/80 (BP Location: Left Arm, Patient Position: Sitting, Cuff Size: Normal)   Pulse 69   Ht 5\' 8"  (1.727 m)   Wt 155 lb 9.6 oz (70.6 kg)   SpO2 97%   BMI 23.66 kg/m   History of Present Illness Fernando Liu is a 83 y.o. 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  He had MRI abdomen 07/31/16 >>  cirrhosis with portal venous HTN with splenomegaly, no mass in liver.  He was started on aldactone.    He is not having cough, wheeze, chest pain, or sputum.  He doesn't feel like his breathing is limiting his activity level.    Physical Exam  General - No distress ENT - No sinus tenderness, no oral exudate, no LAN Cardiac - s1s2 regular, no murmur Chest - decreased BS Rt base Back - No focal tenderness Abd - Soft, non-tender Ext - No edema Neuro - Normal strength Skin - No rashes Psych - normal mood, and behavior   Assessment/Plan  Hepatic hydrothorax. - I reviewed his CT chest and thoracentesis results with him - explained that main goal is to optimize therapy of his cirrhosis - would only consider repeat thoracentesis for symptomatic relief - he would like to pursue a conservative treatment strategy    Patient Instructions  Follow up as needed    Chesley Mires, MD [facility] Pager:  [phone] 08/12/2016, 2:01 PM

## 2016-08-13 ENCOUNTER — Telehealth: Payer: Self-pay | Admitting: Gastroenterology

## 2016-08-13 NOTE — Telephone Encounter (Signed)
Referral for patient on 7.9.18 to come in for cirrhosis of liver w-ascites. Pt had gi care in the past year with [facility]. Patient wants all [facility] care, but not requesting a certain doc. Previous gi records placed on DOD, Dr.Stark's desk for review.

## 2016-08-23 ENCOUNTER — Telehealth: Payer: Self-pay | Admitting: Family Medicine

## 2016-08-23 NOTE — Telephone Encounter (Signed)
So a phone note from 7/19 says they have received old records. His appointment should be fairly soon but not urgent as in next day or two. Please confirm with [facility] they will not accept and if so try [facility]. Would prefer he have his care internally if possible

## 2016-08-23 NOTE — Telephone Encounter (Signed)
Pt called in he would like a call back to discuss. He said that he have a referral to [facility] but feels that he is getting the run around. He said that they told him that they have to review his chart before scheduling pt says that he feels that his visit is urgent. He would like to know if provider could call there office ([facility]) to request a sooner apt?

## 2016-08-23 NOTE — Telephone Encounter (Addendum)
With patient having previous GI dr, records must be reviewed before appointment can be made. That is [facility]'s protocol. Dr Charlett Blake do you feel patient needs urgent appointment? Referral was placed for routine. Please advise

## 2016-08-25 HISTORY — PX: ESOPHAGOGASTRODUODENOSCOPY: SHX1529

## 2016-08-29 ENCOUNTER — Other Ambulatory Visit: Payer: Self-pay | Admitting: Family Medicine

## 2016-09-01 NOTE — Telephone Encounter (Signed)
Message sent to [facility] to check status of referral

## 2016-09-02 NOTE — Telephone Encounter (Signed)
As of today, per [facility]. Is this ok?  From: Elmer Ramp Sent: 09/02/2016   7:59 AM To: Kathlee Nations Morning,  Records for patient have been placed on Dr.Stark's desk for review and Dr.Stark is out of the office this week will return the following week but will be working at the hospital. Since patient was seen within the past year at [facility] his records must be reviewed before an appointment can be scheduled and right now our doctors are all booking into October for office visits.   Thank you, Lorriane Shire

## 2016-09-02 NOTE — Telephone Encounter (Signed)
Thanks, if he is willing to wait we can give them a week or two to review, if not we can transfer to [facility] or [facility]

## 2016-09-06 ENCOUNTER — Encounter: Payer: Self-pay | Admitting: Family Medicine

## 2016-09-06 ENCOUNTER — Ambulatory Visit (INDEPENDENT_AMBULATORY_CARE_PROVIDER_SITE_OTHER): Payer: Medicare Other | Admitting: Family Medicine

## 2016-09-06 VITALS — BP 102/58 | HR 61 | Temp 97.6°F | Resp 18 | Wt 151.2 lb

## 2016-09-06 DIAGNOSIS — I10 Essential (primary) hypertension: Secondary | ICD-10-CM | POA: Diagnosis not present

## 2016-09-06 DIAGNOSIS — K7460 Unspecified cirrhosis of liver: Secondary | ICD-10-CM

## 2016-09-06 DIAGNOSIS — R739 Hyperglycemia, unspecified: Secondary | ICD-10-CM | POA: Diagnosis not present

## 2016-09-06 DIAGNOSIS — J90 Pleural effusion, not elsewhere classified: Secondary | ICD-10-CM | POA: Diagnosis not present

## 2016-09-06 DIAGNOSIS — I2510 Atherosclerotic heart disease of native coronary artery without angina pectoris: Secondary | ICD-10-CM | POA: Diagnosis not present

## 2016-09-06 LAB — COMPREHENSIVE METABOLIC PANEL
ALBUMIN: 3.6 g/dL (ref 3.5–5.2)
ALK PHOS: 137 U/L — AB (ref 39–117)
ALT: 28 U/L (ref 0–53)
AST: 32 U/L (ref 0–37)
BUN: 36 mg/dL — AB (ref 6–23)
CALCIUM: 9.5 mg/dL (ref 8.4–10.5)
CO2: 28 mEq/L (ref 19–32)
Chloride: 103 mEq/L (ref 96–112)
Creatinine, Ser: 1.24 mg/dL (ref 0.40–1.50)
GFR: 58.42 mL/min — ABNORMAL LOW (ref >60.00)
Glucose, Bld: 101 mg/dL — ABNORMAL HIGH (ref 70–99)
POTASSIUM: 4.2 meq/L (ref 3.5–5.1)
SODIUM: 138 meq/L (ref 135–145)
TOTAL PROTEIN: 6.2 g/dL (ref 6.0–8.3)
Total Bilirubin: 1.1 mg/dL (ref 0.2–1.2)

## 2016-09-06 LAB — CBC
HCT: 43.9 % (ref 39.0–52.0)
Hemoglobin: 14.6 g/dL (ref 13.0–17.0)
MCHC: 33.2 g/dL (ref 30.0–36.0)
MCV: 98.2 fl (ref 78.0–100.0)
Platelets: 160 10*3/uL (ref 150.0–400.0)
RBC: 4.47 Mil/uL (ref 4.22–5.81)
RDW: 14.2 % (ref 11.5–15.5)
WBC: 7.6 10*3/uL (ref 4.0–10.5)

## 2016-09-06 MED ORDER — COLESTIPOL HCL 1 G PO TABS: 1.0000 g | ORAL_TABLET | Freq: Two times a day (BID) | ORAL | 2 refills | Status: AC

## 2016-09-06 NOTE — Assessment & Plan Note (Signed)
Has been released from pulmonology after being tapped a couple of times. Will recall them prn.

## 2016-09-06 NOTE — Progress Notes (Signed)
Subjective:    Patient ID: Fernando Liu, male    DOB: 08/22/1926, 83 y.o.   MRN: [id number]  No chief complaint on file.   HPI  Mr. Talarico is in the office today for a 4 week follow up about is liver cirrhosis. He saw Dr. Halford Chessman of pulmonology for his right sided pleural effusion and reports that he can follow up with pulmonology on an as needed basis. He was unable to see GI because their next available appointment is not until October. Since his last visit 4 weeks ago, he has not had any acute events or concerns. He denies any episodes of chest pain, shortness of breath, fever chills or coughs. He notes that his appetite is still subdued, but states that he is drinking more Ensure and V8 vegetable juice, which we encouraged him to continue. He has stopped drinking alcohol. He notes that he has a history of bowel incontinence secondary to a surgery to fix an anal abscess but reveals that he recently is BMs have normalized. He has an itchy rash on both arms that does not keep him awake at night or bleeds but states that he has had this for many years and that it does not bother him greatly.   Patient Care Team: Mosie Lukes, MD as PCP - General (Family Medicine) Becky Sax, MD as Referring Physician (Orthopedic Surgery) Noralyn Pick, MD as Consulting Physician (Ophthalmology)   Past Medical History:  Diagnosis Date  . Anemia 12/28/2015  . CAD (coronary artery disease)   . Chicken pox 83 yrs old  . Cirrhosis of liver ([place]) 06/29/2016  . Decreased breath sounds 05/27/2016  . Gout 04/24/2015  . High cholesterol   . Hypertension   . Measles as a child  . Medicare annual wellness visit, subsequent 10/31/2012   Living will wife and daughter have a copy, does not want heroic measures Sees cardiology his recent cardiologist is retiring his new doctor will be at [employer]   . Neuropathy   . Osteoporosis 12/01/2015  . Pleural effusion on right 08/01/2016  . Preventative health care  10/31/2012  . Thyroid disease     Past Surgical History:  Procedure Laterality Date  . CATARACT EXTRACTION, BILATERAL     August & November 2012  . FOOT SURGERY Left   . HERNIA REPAIR    . PELVIC ABCESS DRAINAGE     states surgery affected control of BM  . TONSILLECTOMY      Family History  Problem Relation Age of Onset  . Stroke Mother   . Hypertension Mother   . Cancer Maternal Grandmother   . Heart disease Father   . Cancer Daughter   . Breast cancer Neg Hx   . Diabetes Neg Hx   . Colon cancer Neg Hx   . Prostate cancer Neg Hx     Social History   Social History  . Marital status: Married    Spouse name: N/A  . Number of children: N/A  . Years of education: N/A   Occupational History  . Semi-retired Chief Financial Officer    Social History Main Topics  . Smoking status: Never Smoker  . Smokeless tobacco: Never Used  . Alcohol use 1.2 oz/week    2 Shots of liquor per week     Comment: daily  . Drug use: No  . Sexual activity: Not on file   Other Topics Concern  . Not on file   Social History Narrative  . No narrative on  file    Outpatient Medications Prior to Visit  Medication Sig Dispense Refill  . allopurinol (ZYLOPRIM) 100 MG tablet TAKE 1 TABLET (100 MG TOTAL) BY MOUTH DAILY. 30 tablet 6  . atorvastatin (LIPITOR) 10 MG tablet TAKE 1 TABLET BY MOUTH AT BEDTIME 30 tablet 4  . b complex vitamins tablet Take 1 tablet by mouth daily.    . Calcium Carb-Cholecalciferol (CALCIUM 600 + D PO) Take 600 mg by mouth 2 (two) times daily. Pt takes 1/2 pill twice daily.    . cholecalciferol (VITAMIN D) 1000 units tablet Take 1,000 Units by mouth daily.    . clopidogrel (PLAVIX) 75 MG tablet Take 75 mg by mouth daily.     Marland Kitchen levothyroxine (SYNTHROID, LEVOTHROID) 75 MCG tablet TAKE 1 TABLET BY MOUTH EVERY DAY 30 tablet 4  . metoprolol tartrate (LOPRESSOR) 25 MG tablet Take 12.5 mg by mouth 2 (two) times daily.    . Multiple Vitamins-Minerals (MULTIVITAMIN ADULT PO) Take 1 tablet  by mouth 2 (two) times a week.    . nitroGLYCERIN (NITROSTAT) 0.4 MG SL tablet Place 0.4 mg under the tongue every 5 (five) minutes as needed. For chest pain    . ranitidine (ZANTAC) 150 MG capsule Take 1 capsule (150 mg total) by mouth every evening. For acid protection 30 capsule 2  . spironolactone (ALDACTONE) 25 MG tablet TAKE 1 TABLET BY MOUTH EVERY DAY 30 tablet 1  . MICRONIZED COLESTIPOL HCL 1 G tablet Take 1 g by mouth 2 (two) times daily.  2   No facility-administered medications prior to visit.     Allergies  Allergen Reactions  . Aspirin Swelling    Review of Systems  All other systems reviewed and are negative.      Objective:    Physical Exam  Constitutional: He is oriented to person, place, and time. He appears well-developed.  HENT:  Head: Normocephalic and atraumatic.  Right Ear: External ear normal.  Left Ear: External ear normal.  Nose: Nose normal.  Eyes: No scleral icterus.  Cardiovascular: Normal rate, regular rhythm, normal heart sounds and intact distal pulses.   No murmur heard. Pulmonary/Chest: Effort normal and breath sounds normal. No respiratory distress. He has no wheezes. He has no rales. He exhibits no tenderness.  Abdominal: Soft. Bowel sounds are normal. There is no tenderness. There is no rebound and no guarding.  Slight hepatomegaly noted. No caput medusae.  Neurological: He is alert and oriented to person, place, and time.  Skin: Skin is warm and dry.  Rash inside of both forearms that patient states has been like that for many years. No bleeding or scaling.  Vitals reviewed.   BP (!) 102/58 (BP Location: Left Arm, Patient Position: Sitting, Cuff Size: Normal)   Pulse 61   Temp 97.6 F (36.4 C) (Oral)   Resp 18   Wt 151 lb 3.2 oz (68.6 kg)   SpO2 98%   BMI 22.99 kg/m  Wt Readings from Last 3 Encounters:  09/06/16 151 lb 3.2 oz (68.6 kg)  08/12/16 155 lb 9.6 oz (70.6 kg)  08/09/16 159 lb 12.8 oz (72.5 kg)   BP Readings from Last  3 Encounters:  09/06/16 (!) 102/58  08/12/16 120/80  08/09/16 (!) 118/58     Immunization History  Administered Date(s) Administered  . Pneumococcal Conjugate-13 10/31/2012  . Td 01/21/2011    Health Maintenance  Topic Date Due  . PNA vac Low Risk Adult (2 of 2 - PPSV23) 10/31/2013  . INFLUENZA  VACCINE  08/25/2016  . TETANUS/TDAP  01/20/2021    Lab Results  Component Value Date   WBC 7.2 08/09/2016   HGB 14.6 08/09/2016   HCT 43.5 08/09/2016   PLT 176.0 08/09/2016   GLUCOSE 97 08/09/2016   CHOL 113 05/07/2016   TRIG 65.0 05/07/2016   HDL 52.60 05/07/2016   LDLCALC 47 05/07/2016   ALT 26 08/09/2016   AST 34 08/09/2016   NA 139 08/09/2016   K 4.3 08/09/2016   CL 106 08/09/2016   CREATININE 1.26 08/09/2016   BUN 18 08/09/2016   CO2 28 08/09/2016   TSH 4.09 05/27/2016   PSA 0.62 04/30/2013   HGBA1C 5.2 05/07/2016    Lab Results  Component Value Date   TSH 4.09 05/27/2016   Lab Results  Component Value Date   WBC 7.2 08/09/2016   HGB 14.6 08/09/2016   HCT 43.5 08/09/2016   MCV 96.8 08/09/2016   PLT 176.0 08/09/2016   Lab Results  Component Value Date   NA 139 08/09/2016   K 4.3 08/09/2016   CO2 28 08/09/2016   GLUCOSE 97 08/09/2016   BUN 18 08/09/2016   CREATININE 1.26 08/09/2016   BILITOT 0.9 08/09/2016   ALKPHOS 160 (H) 08/09/2016   AST 34 08/09/2016   ALT 26 08/09/2016   PROT 6.2 08/09/2016   ALBUMIN 3.3 (L) 08/09/2016   CALCIUM 9.1 08/09/2016   GFR 57.36 (L) 08/09/2016   Lab Results  Component Value Date   CHOL 113 05/07/2016   Lab Results  Component Value Date   HDL 52.60 05/07/2016   Lab Results  Component Value Date   LDLCALC 47 05/07/2016   Lab Results  Component Value Date   TRIG 65.0 05/07/2016   Lab Results  Component Value Date   CHOLHDL 2 05/07/2016   Lab Results  Component Value Date   HGBA1C 5.2 05/07/2016         Assessment & Plan:   Problem List Items Addressed This Visit    HTN (hypertension)    Well  controlled, no changes to meds. Encouraged heart healthy diet such as the DASH diet and exercise as tolerated.       Relevant Medications   colestipol (MICRONIZED COLESTIPOL HCL) 1 g tablet   Other Relevant Orders   Comprehensive metabolic panel   CBC   Hyperglycemia    hgba1c acceptable, minimize simple carbs. Increase exercise as tolerated. Is advised to eat what he feels he can at this time.      Cirrhosis of liver ([place]) - Primary   Relevant Orders   Ambulatory referral to Gastroenterology   Pleural effusion on right    Has been released from pulmonology after being tapped a couple of times. Will recall them prn.        Cirrhosis of Liver: Patient has not been able to see GI since his last visit. Re-referred patient to GI. Educated patient about his cirrhosis, including potential signs and symptoms and course. Moderate hepatomegaly on exam but no caput medusae noted. Continue current medications. Recheck BMP and CBC today.  Pleural effusion on right: Patient has seen pulmonology, who cleared the patient and said to return on an as needed basis. Lungs were clear to auscultation and percussion bilaterally.   HTN: Blood pressure was well-controlled at this visit. Continue current medications.  History of bowel incontinence: Patient reports a history of bowel incontinence and diarrhea as a complication from surgery of an anal abscess over 20 years ago. Notes that  he was started on colestipol by his previous physician, Dr. Alice Reichert, due to its side effect profile of constipation and requests a refill of his prescription to take on an as needed basis. Refill medication.    I have changed Mr. Gopaul's MICRONIZED COLESTIPOL HCL to colestipol. I am also having him maintain his clopidogrel, nitroGLYCERIN, ranitidine, b complex vitamins, atorvastatin, levothyroxine, allopurinol, cholecalciferol, Calcium Carb-Cholecalciferol (CALCIUM 600 + D PO), Multiple Vitamins-Minerals (MULTIVITAMIN ADULT PO),  metoprolol tartrate, and spironolactone.  Meds ordered this encounter  Medications  . colestipol (MICRONIZED COLESTIPOL HCL) 1 g tablet    Sig: Take 1 tablet (1 g total) by mouth 2 (two) times daily.    Dispense:  60 tablet    Refill:  2    Patient seen with and examined with student.  Agree with documentation See separate note for further documentation  Penni Homans, MD Volanda Napoleon, Medical Student

## 2016-09-06 NOTE — Patient Instructions (Signed)
Ascites °Ascites is a collection of excess fluid in the abdomen. Ascites can range from mild to severe. It can get worse without treatment. °What are the causes? °Possible causes include: °· Cirrhosis. This is the most common cause of ascites. °· Infection or inflammation in the abdomen. °· Cancer in the abdomen. °· Heart failure. °· Kidney disease. °· Inflammation of the pancreas. °· Clots in the veins of the liver. ° °What are the signs or symptoms? °Signs and symptoms may include: °· A feeling of fullness in your abdomen. This is common. °· An increase in the size of your abdomen or your waist. °· Swelling in your legs. °· Swelling of the scrotum in men. °· Difficulty breathing. °· Abdominal pain. °· Sudden weight gain. ° °If the condition is mild, you may not have symptoms. °How is this diagnosed? °To make a diagnosis, your health care provider will: °· Ask about your medical history. °· Perform a physical exam. °· Order imaging tests, such as an ultrasound or CT scan of your abdomen. ° °How is this treated? °Treatment depends on the cause of the ascites. It may include: °· Taking a pill to make you urinate. This is called a water pill (diuretic pill). °· Strictly reducing your salt (sodium) intake. Salt can cause extra fluid to be kept in the body, and this makes ascites worse. °· Having a procedure to remove fluid from your abdomen (paracentesis). °· Having a procedure to transfer fluid from your abdomen into a vein. °· Having a procedure that connects two of the major veins within your liver and relieves pressure on your liver (TIPS procedure). ° °Ascites may go away or improve with treatment of the condition that caused it. °Follow these instructions at home: °· Keep track of your weight. To do this, weigh yourself at the same time every day and record your weight. °· Keep track of how much you drink and any changes in the amount you urinate. °· Follow any instructions that your health care provider gives  you about how much to drink. °· Try not to eat salty (high-sodium) foods. °· Take medicines only as directed by your health care provider. °· Keep all follow-up visits as directed by your health care provider. This is important. °· Report any changes in your health to your health care provider, especially if you develop new symptoms or your symptoms get worse. °Contact a health care provider if: °· Your gain more than 3 pounds in 3 days. °· Your abdominal size or your waist size increases. °· You have new swelling in your legs. °· The swelling in your legs gets worse. °Get help right away if: °· You develop a fever. °· You develop confusion. °· You develop new or worsening difficulty breathing. °· You develop new or worsening abdominal pain. °· You develop new or worsening swelling in the scrotum (in men). °This information is not intended to replace advice given to you by your health care provider. Make sure you discuss any questions you have with your health care provider. °Document Released: 01/11/2005 Document Revised: 05/21/2015 Document Reviewed: 08/10/2013 °Elsevier Interactive Patient Education © 2018 [employer] ° °

## 2016-09-06 NOTE — Progress Notes (Signed)
Subjective:  I acted as a Education administrator for Dr. Charlett Blake. [place], [place]  Patient ID: Fernando Liu, male    DOB: [birth year], 83 y.o.   MRN: [id number]  No chief complaint on file.   HPI  Patient is in today for a 4 week follow up. He is accompanied by his wife. He is doing fairly well. Continues to struggle with anorexia and fatigue. No shortness of breath with exertion. Doesn't knowledge she's quit all alcohol and does feel a little bit better. Has some bloating in his abdomen. Is awaiting GI consultation but his right pleural effusion appears to have stabilized and he is no longer needing to return frequently to pulmonology. No recent febrile illness or acute hospitalizations. Denies CP/palp/HA/congestion/fevers or GU c/o. Taking meds as prescribed  Patient Care Team: Mosie Lukes, MD as PCP - General (Family Medicine) Becky Sax, MD as Referring Physician (Orthopedic Surgery) Noralyn Pick, MD as Consulting Physician (Ophthalmology)   Past Medical History:  Diagnosis Date  . Anemia 12/28/2015  . CAD (coronary artery disease)   . Chicken pox 83 yrs old  . Cirrhosis of liver ([place]) 06/29/2016  . Decreased breath sounds 05/27/2016  . Gout 04/24/2015  . High cholesterol   . Hypertension   . Measles as a child  . Medicare annual wellness visit, subsequent 10/31/2012   Living will wife and daughter have a copy, does not want heroic measures Sees cardiology his recent cardiologist is retiring his new doctor will be at [employer]   . Neuropathy   . Osteoporosis 12/01/2015  . Pleural effusion on right 08/01/2016  . Preventative health care 10/31/2012  . Thyroid disease     Past Surgical History:  Procedure Laterality Date  . CATARACT EXTRACTION, BILATERAL     August & November 2012  . FOOT SURGERY Left   . HERNIA REPAIR    . PELVIC ABCESS DRAINAGE     states surgery affected control of BM  . TONSILLECTOMY      Family History  Problem Relation Age of Onset  . Stroke Mother   .  Hypertension Mother   . Cancer Maternal Grandmother   . Heart disease Father   . Cancer Daughter   . Breast cancer Neg Hx   . Diabetes Neg Hx   . Colon cancer Neg Hx   . Prostate cancer Neg Hx     Social History   Social History  . Marital status: Married    Spouse name: N/A  . Number of children: N/A  . Years of education: N/A   Occupational History  . Semi-retired Chief Financial Officer    Social History Main Topics  . Smoking status: Never Smoker  . Smokeless tobacco: Never Used  . Alcohol use 1.2 oz/week    2 Shots of liquor per week     Comment: daily  . Drug use: No  . Sexual activity: Not on file   Other Topics Concern  . Not on file   Social History Narrative  . No narrative on file    Outpatient Medications Prior to Visit  Medication Sig Dispense Refill  . allopurinol (ZYLOPRIM) 100 MG tablet TAKE 1 TABLET (100 MG TOTAL) BY MOUTH DAILY. 30 tablet 6  . atorvastatin (LIPITOR) 10 MG tablet TAKE 1 TABLET BY MOUTH AT BEDTIME 30 tablet 4  . b complex vitamins tablet Take 1 tablet by mouth daily.    . Calcium Carb-Cholecalciferol (CALCIUM 600 + D PO) Take 600 mg by mouth 2 (two) times daily.  Pt takes 1/2 pill twice daily.    . cholecalciferol (VITAMIN D) 1000 units tablet Take 1,000 Units by mouth daily.    . clopidogrel (PLAVIX) 75 MG tablet Take 75 mg by mouth daily.     Marland Kitchen levothyroxine (SYNTHROID, LEVOTHROID) 75 MCG tablet TAKE 1 TABLET BY MOUTH EVERY DAY 30 tablet 4  . metoprolol tartrate (LOPRESSOR) 25 MG tablet Take 12.5 mg by mouth 2 (two) times daily.    . Multiple Vitamins-Minerals (MULTIVITAMIN ADULT PO) Take 1 tablet by mouth 2 (two) times a week.    . nitroGLYCERIN (NITROSTAT) 0.4 MG SL tablet Place 0.4 mg under the tongue every 5 (five) minutes as needed. For chest pain    . ranitidine (ZANTAC) 150 MG capsule Take 1 capsule (150 mg total) by mouth every evening. For acid protection 30 capsule 2  . spironolactone (ALDACTONE) 25 MG tablet TAKE 1 TABLET BY MOUTH EVERY  DAY 30 tablet 1  . MICRONIZED COLESTIPOL HCL 1 G tablet Take 1 g by mouth 2 (two) times daily.  2   No facility-administered medications prior to visit.     Allergies  Allergen Reactions  . Aspirin Swelling    Review of Systems  Constitutional: Positive for malaise/fatigue. Negative for fever.  HENT: Negative for congestion.   Eyes: Negative for blurred vision.  Respiratory: Positive for shortness of breath.   Cardiovascular: Negative for chest pain, palpitations and leg swelling.  Gastrointestinal: Negative for abdominal pain, blood in stool and nausea.  Genitourinary: Negative for dysuria and frequency.  Musculoskeletal: Negative for falls.  Skin: Negative for rash.  Neurological: Negative for dizziness, loss of consciousness and headaches.  Endo/Heme/Allergies: Negative for environmental allergies.  Psychiatric/Behavioral: Negative for depression. The patient is not nervous/anxious.        Objective:    Physical Exam  Constitutional: He is oriented to person, place, and time. He appears well-developed and well-nourished. No distress.  HENT:  Head: Normocephalic and atraumatic.  Nose: Nose normal.  Eyes: Right eye exhibits no discharge. Left eye exhibits no discharge.  Neck: Normal range of motion. Neck supple.  Cardiovascular: Normal rate and regular rhythm.   No murmur heard. Pulmonary/Chest: Effort normal and breath sounds normal.  Abdominal: Soft. Bowel sounds are normal. He exhibits no distension. There is no tenderness.  Liver margin noted 2 FB below costal margin  Musculoskeletal: He exhibits no edema.  Neurological: He is alert and oriented to person, place, and time.  Skin: Skin is warm and dry.  Psychiatric: He has a normal mood and affect.  Nursing note and vitals reviewed.   BP (!) 102/58 (BP Location: Left Arm, Patient Position: Sitting, Cuff Size: Normal)   Pulse 61   Temp 97.6 F (36.4 C) (Oral)   Resp 18   Wt 151 lb 3.2 oz (68.6 kg)   SpO2 98%    BMI 22.99 kg/m  Wt Readings from Last 3 Encounters:  09/06/16 151 lb 3.2 oz (68.6 kg)  08/12/16 155 lb 9.6 oz (70.6 kg)  08/09/16 159 lb 12.8 oz (72.5 kg)   BP Readings from Last 3 Encounters:  09/06/16 (!) 102/58  08/12/16 120/80  08/09/16 (!) 118/58     Immunization History  Administered Date(s) Administered  . Pneumococcal Conjugate-13 10/31/2012  . Td 01/21/2011    Health Maintenance  Topic Date Due  . PNA vac Low Risk Adult (2 of 2 - PPSV23) 10/31/2013  . INFLUENZA VACCINE  08/25/2016  . TETANUS/TDAP  01/20/2021    Lab Results  Component Value Date   WBC 7.6 09/06/2016   HGB 14.6 09/06/2016   HCT 43.9 09/06/2016   PLT 160.0 09/06/2016   GLUCOSE 101 (H) 09/06/2016   CHOL 113 05/07/2016   TRIG 65.0 05/07/2016   HDL 52.60 05/07/2016   LDLCALC 47 05/07/2016   ALT 28 09/06/2016   AST 32 09/06/2016   NA 138 09/06/2016   K 4.2 09/06/2016   CL 103 09/06/2016   CREATININE 1.24 09/06/2016   BUN 36 (H) 09/06/2016   CO2 28 09/06/2016   TSH 4.09 05/27/2016   PSA 0.62 04/30/2013   HGBA1C 5.2 05/07/2016    Lab Results  Component Value Date   TSH 4.09 05/27/2016   Lab Results  Component Value Date   WBC 7.6 09/06/2016   HGB 14.6 09/06/2016   HCT 43.9 09/06/2016   MCV 98.2 09/06/2016   PLT 160.0 09/06/2016   Lab Results  Component Value Date   NA 138 09/06/2016   K 4.2 09/06/2016   CO2 28 09/06/2016   GLUCOSE 101 (H) 09/06/2016   BUN 36 (H) 09/06/2016   CREATININE 1.24 09/06/2016   BILITOT 1.1 09/06/2016   ALKPHOS 137 (H) 09/06/2016   AST 32 09/06/2016   ALT 28 09/06/2016   PROT 6.2 09/06/2016   ALBUMIN 3.6 09/06/2016   CALCIUM 9.5 09/06/2016   GFR 58.42 (L) 09/06/2016   Lab Results  Component Value Date   CHOL 113 05/07/2016   Lab Results  Component Value Date   HDL 52.60 05/07/2016   Lab Results  Component Value Date   LDLCALC 47 05/07/2016   Lab Results  Component Value Date   TRIG 65.0 05/07/2016   Lab Results  Component Value  Date   CHOLHDL 2 05/07/2016   Lab Results  Component Value Date   HGBA1C 5.2 05/07/2016         Assessment & Plan:   Problem List Items Addressed This Visit    HTN (hypertension)    Well controlled, no changes to meds. Encouraged heart healthy diet such as the DASH diet and exercise as tolerated.       Relevant Medications   colestipol (MICRONIZED COLESTIPOL HCL) 1 g tablet   Other Relevant Orders   Comprehensive metabolic panel (Completed)   CBC (Completed)   Hyperglycemia    hgba1c acceptable, minimize simple carbs. Increase exercise as tolerated. Is advised to eat what he feels he can at this time.      Cirrhosis of liver ([place]) - Primary   Relevant Orders   Ambulatory referral to Gastroenterology   Pleural effusion on right    Has been released from pulmonology after being tapped a couple of times. Will recall them prn.         I have changed Mr. Rakers's MICRONIZED COLESTIPOL HCL to colestipol. I am also having him maintain his clopidogrel, nitroGLYCERIN, ranitidine, b complex vitamins, atorvastatin, levothyroxine, allopurinol, cholecalciferol, Calcium Carb-Cholecalciferol (CALCIUM 600 + D PO), Multiple Vitamins-Minerals (MULTIVITAMIN ADULT PO), metoprolol tartrate, and spironolactone.  Meds ordered this encounter  Medications  . colestipol (MICRONIZED COLESTIPOL HCL) 1 g tablet    Sig: Take 1 tablet (1 g total) by mouth 2 (two) times daily.    Dispense:  60 tablet    Refill:  2    CMA served as scribe during this visit. History, Physical and Plan performed by medical provider. Documentation and orders reviewed and attested to.  Penni Homans, MD

## 2016-09-06 NOTE — Assessment & Plan Note (Addendum)
hgba1c acceptable, minimize simple carbs. Increase exercise as tolerated. Is advised to eat what he feels he can at this time.

## 2016-09-06 NOTE — Assessment & Plan Note (Signed)
Well controlled, no changes to meds. Encouraged heart healthy diet such as the DASH diet and exercise as tolerated.  °

## 2016-09-16 NOTE — Progress Notes (Signed)
Subjective:   Fernando Liu is a 83 y.o. male who presents for Medicare Annual/Subsequent preventive examination.  Review of Systems:  No ROS.  Medicare Wellness Visit. Additional risk factors are reflected in the social history. Cardiac Risk Factors include: advanced age (>27men, >19 women);dyslipidemia;hypertension;male gender Sleep patterns: Wakes frequently to urinate. Goes back to sleep easily   Home Safety/Smoke Alarms: Feels safe in home. Smoke alarms in place.  Living environment; residence and Firearm Safety: Lives with wife in 1st floor apt.  Seat Belt Safety/Bike Helmet: Wears seat belt.   Male:    PSA-  Lab Results  Component Value Date   PSA 0.62 04/30/2013   PSA 0.64 04/26/2012       Objective:    Vitals: BP (!) 104/50 (BP Location: Right Arm, Patient Position: Sitting, Cuff Size: Normal)   Pulse (!) 59   Ht 5\' 8"  (1.727 m)   Wt 149 lb 3.2 oz (67.7 kg)   SpO2 98%   BMI 22.69 kg/m   Body mass index is 22.69 kg/m.  Tobacco History  Smoking Status  . Never Smoker  Smokeless Tobacco  . Never Used     Counseling given: Not Answered   Past Medical History:  Diagnosis Date  . Anemia 12/28/2015  . CAD (coronary artery disease)   . Chicken pox 83 yrs old  . Cirrhosis of liver ([place]) 06/29/2016  . Decreased breath sounds 05/27/2016  . Gout 04/24/2015  . High cholesterol   . Hypertension   . Measles as a child  . Medicare annual wellness visit, subsequent 10/31/2012   Living will wife and daughter have a copy, does not want heroic measures Sees cardiology his recent cardiologist is retiring his new doctor will be at [employer]   . Neuropathy   . Osteoporosis 12/01/2015  . Pleural effusion on right 08/01/2016  . Preventative health care 10/31/2012  . Thyroid disease    Past Surgical History:  Procedure Laterality Date  . CATARACT EXTRACTION, BILATERAL     August & November 2012  . FOOT SURGERY Left   . HERNIA REPAIR    . PELVIC ABCESS DRAINAGE     states  surgery affected control of BM  . TONSILLECTOMY     Family History  Problem Relation Age of Onset  . Stroke Mother   . Hypertension Mother   . Cancer Maternal Grandmother   . Heart disease Father   . Cancer Daughter   . Breast cancer Neg Hx   . Diabetes Neg Hx   . Colon cancer Neg Hx   . Prostate cancer Neg Hx    History  Sexual Activity  . Sexual activity: Not on file    Outpatient Encounter Prescriptions as of 09/21/2016  Medication Sig  . allopurinol (ZYLOPRIM) 100 MG tablet TAKE 1 TABLET (100 MG TOTAL) BY MOUTH DAILY.  Marland Kitchen atorvastatin (LIPITOR) 10 MG tablet TAKE 1 TABLET BY MOUTH AT BEDTIME  . b complex vitamins tablet Take 1 tablet by mouth daily.  . Calcium Carb-Cholecalciferol (CALCIUM 600 + D PO) Take 600 mg by mouth 2 (two) times daily. Pt takes 1/2 pill twice daily.  . cholecalciferol (VITAMIN D) 1000 units tablet Take 1,000 Units by mouth daily.  . clopidogrel (PLAVIX) 75 MG tablet Take 75 mg by mouth daily.   . colestipol (MICRONIZED COLESTIPOL HCL) 1 g tablet Take 1 tablet (1 g total) by mouth 2 (two) times daily. (Patient taking differently: Take 1 g by mouth as needed. )  . levothyroxine (  SYNTHROID, LEVOTHROID) 75 MCG tablet TAKE 1 TABLET BY MOUTH EVERY DAY  . metoprolol tartrate (LOPRESSOR) 25 MG tablet Take 12.5 mg by mouth 2 (two) times daily.  . Multiple Vitamins-Minerals (MULTIVITAMIN ADULT PO) Take 1 tablet by mouth 2 (two) times a week.  . spironolactone (ALDACTONE) 50 MG tablet Take 1 tablet (50 mg total) by mouth daily.  . nitroGLYCERIN (NITROSTAT) 0.4 MG SL tablet Place 0.4 mg under the tongue every 5 (five) minutes as needed. For chest pain  . ranitidine (ZANTAC) 150 MG capsule Take 1 capsule (150 mg total) by mouth every evening. For acid protection (Patient not taking: Reported on 09/21/2016)  . [DISCONTINUED] spironolactone (ALDACTONE) 25 MG tablet TAKE 1 TABLET BY MOUTH EVERY DAY   No facility-administered encounter medications on file as of 09/21/2016.      Activities of Daily Living In your present state of health, do you have any difficulty performing the following activities: 09/21/2016  Hearing? N  Vision? N  Comment wears glasses. Eye doctor yearly  Difficulty concentrating or making decisions? N  Walking or climbing stairs? N  Dressing or bathing? N  Doing errands, shopping? N  Preparing Food and eating ? N  Using the Toilet? N  In the past six months, have you accidently leaked urine? Y  Do you have problems with loss of bowel control? Y  Managing your Medications? N  Managing your Finances? N  Housekeeping or managing your Housekeeping? N  Some recent data might be hidden    Patient Care Team: Mosie Lukes, MD as PCP - General (Family Medicine) Becky Sax, MD as Referring Physician (Orthopedic Surgery) Noralyn Pick, MD as Consulting Physician (Ophthalmology)   Assessment:    Physical assessment deferred to PCP.  Exercise Activities and Dietary recommendations Current Exercise Habits: The patient does not participate in regular exercise at present, Exercise limited by: None identified     Goals    . Move back to [place]  09/21/2016 02/27/2016 04/24/2015 11/08/2013 11/05/2012  Falls in the past year? No Yes No No No  Number falls in past yr: - 1 - - -  Injury with Fall? - Yes - - -   Depression Screen PHQ 2/9 Scores 09/21/2016 04/24/2015 11/08/2013 11/05/2012  PHQ - 2 Score 0 0 0 0    Cognitive Function MMSE - Mini Mental State Exam 09/21/2016  Orientation to time 5  Orientation to Place 5  Registration 3  Attention/ Calculation 5  Recall 2  Language- name 2 objects 2  Language- repeat 1  Language- follow 3 step command 3  Language- read & follow direction 1  Write a sentence 1  Copy design 1  Total score 29        Immunization History  Administered Date(s) Administered  . Pneumococcal Conjugate-13 10/31/2012  . Td 01/21/2011   Screening Tests Health  Maintenance  Topic Date Due  . PNA vac Low Risk Adult (2 of 2 - PPSV23) 10/31/2013  . INFLUENZA VACCINE  08/25/2016  . TETANUS/TDAP  01/20/2021      Plan:   Follow up with Dr.Blyth as scheduled 10/22/16.  Continue to eat heart healthy diet (full of fruits, vegetables, whole grains, lean protein, water--limit salt, fat, and sugar intake) and increase physical activity as tolerated.  Continue doing brain stimulating activities (puzzles, reading, adult coloring books, staying active) to keep memory sharp.   Check your medication Spironolactone and make sure you are  taking the correct dosage.  I have personally reviewed and noted the following in the patient's chart:   . Medical and social history . Use of alcohol, tobacco or illicit drugs  . Current medications and supplements . Functional ability and status . Nutritional status . Physical activity . Advanced directives . List of other physicians . Hospitalizations, surgeries, and ER visits in previous 12 months . Vitals . Screenings to include cognitive, depression, and falls . Referrals and appointments  In addition, I have reviewed and discussed with patient certain preventive protocols, quality metrics, and best practice recommendations. A written personalized care plan for preventive services as well as general preventive health recommendations were provided to patient.     Shela Nevin, [place]  09/21/2016  Medical screening examination was performed by [facility] Coach and as supervising physician I was immediately available for consultation/collaboration. I have reviewed documentation and agree with assessment and plan.  Penni Homans, MD

## 2016-09-17 NOTE — Telephone Encounter (Signed)
Patient requesting Dr.Gessner so records moved to his desk for review per nurse Barbera Setters.

## 2016-09-20 ENCOUNTER — Ambulatory Visit (INDEPENDENT_AMBULATORY_CARE_PROVIDER_SITE_OTHER): Payer: Medicare Other | Admitting: Internal Medicine

## 2016-09-20 ENCOUNTER — Encounter: Payer: Self-pay | Admitting: Internal Medicine

## 2016-09-20 ENCOUNTER — Other Ambulatory Visit: Payer: Medicare Other

## 2016-09-20 VITALS — BP 110/56 | HR 76 | Ht 68.0 in | Wt 149.0 lb

## 2016-09-20 DIAGNOSIS — K7460 Unspecified cirrhosis of liver: Secondary | ICD-10-CM

## 2016-09-20 DIAGNOSIS — R188 Other ascites: Principal | ICD-10-CM

## 2016-09-20 DIAGNOSIS — R630 Anorexia: Secondary | ICD-10-CM

## 2016-09-20 DIAGNOSIS — R933 Abnormal findings on diagnostic imaging of other parts of digestive tract: Secondary | ICD-10-CM

## 2016-09-20 DIAGNOSIS — K7689 Other specified diseases of liver: Secondary | ICD-10-CM | POA: Diagnosis not present

## 2016-09-20 DIAGNOSIS — I2510 Atherosclerotic heart disease of native coronary artery without angina pectoris: Secondary | ICD-10-CM

## 2016-09-20 DIAGNOSIS — K769 Liver disease, unspecified: Secondary | ICD-10-CM | POA: Insufficient documentation

## 2016-09-20 DIAGNOSIS — J90 Pleural effusion, not elsewhere classified: Secondary | ICD-10-CM

## 2016-09-20 MED ORDER — SPIRONOLACTONE 50 MG PO TABS: 50.0000 mg | ORAL_TABLET | Freq: Every day | ORAL | 1 refills | Status: AC

## 2016-09-20 NOTE — Telephone Encounter (Signed)
Dr.Gessner accepted patient for office visit and patient will come in today 09/20/16 at 3:15pm. Records given to PJ.

## 2016-09-20 NOTE — Progress Notes (Signed)
Taison Celani 83 y.o. 1926/01/31 [id number]  Assessment & Plan:   Encounter Diagnoses  Name Primary?  . Cirrhosis of liver with ascites, unspecified hepatic cirrhosis type ([place]) Yes  . Anorexia   . Pleural effusion on right   . Liver lesion, left lobe   . 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 This will also serve to evaluate the anorexia and the thickened wall of the stomach seen on CT of the chest. The risks and benefits as well as alternatives of endoscopic procedure(s) have been discussed and reviewed. All questions answered. The patient agrees to proceed.  At 26 and with a history of regular alcohol consumption maybe it was alcohol. I do think a little bit of serologic workup is reasonable so will do a Chronic hepatitis panel, ANA and anti smooth muscle antibody. Ferritin is already normal so hemochromatosis does not seem to be the cause  Increase spironolactone to 50 mg daily as he seems to have some pleural effusion and ascites still today. His BUN was up a little bit before but we need to push his diuretics and also limit his fluid intake.  Follow-up will be arranged with me after EGD. It may shed some light on his loss of appetite, though I wonder if he doesn't have some ascites compressing the stomach. For the edema in the omentum is having that effect.  There was reassuring information on the liver lesion seen on CT of the chest in that on MRI it was thought to be a perfusion anomaly. Consider follow-up of that at  some point. He certainly is at increased risk of primary liver cancer with cirrhosis. 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 He had a pleural effusion on the right and saw Dr. Irene Pap pulmonary. CT of the chest showed that it showed cirrhosis and a liver lesion in the left lobe of the liver 10 mm in size hyperenhancing focus and gastric greater curvature wall thickening. He had a pleural effusion drained with thoracentesis, 1.7 L and Dr. so.this was consistent with ascites leaking into the pleural space. Subsequently he saw Dr. Charlett Blake he has been treated with spironolactone 25 mg daily and he has had improvement in his symptoms. Because of the abnormal liver on CT he had an MRI of the liver with and without contrast, on July 7, and it showed a 10 mm left liver lobe area of hyperperfusion thought to be an anomaly and not a mass. Splenomegaly, patent portal and hepatic veins and suspected portal venous hypertension with small periesophageal varices and Moderate right pleural effusion omental edema small volume ascites around the liver and moderate right pleural effusion  No known family history of  liver disease. The patient has been a regular user of alcohol was told don't life. Generally about 2 glasses of bourbon or shots of bourbon a day. May be more at times. He recently stopped that. No family history of liver disease. The patient had seen Dr. Alice Reichert in [place] in the past, he's had some bowel incontinence he had surgery for a perirectal abscess years ago and has had a weak sphincter since then. Colestipol has provided a great improvement in symptoms. He does not  want to do a colonoscopy.    The patient reports that he may be returning to the [facility] sometime in the near future. He moved down here with his wife, his wife had a daughter that needed care and the daughter has unfortunately passed away in the past couple of years. His wife is 13 years is Paramedic and they are thinking of moving to be near her family.  Allergies  Allergen Reactions  . Aspirin Swelling   Current Meds  Medication Sig  . allopurinol (ZYLOPRIM) 100 MG tablet TAKE 1 TABLET (100 MG TOTAL) BY MOUTH DAILY.  Marland Kitchen atorvastatin (LIPITOR) 10 MG tablet TAKE 1 TABLET BY MOUTH AT BEDTIME  . b complex vitamins tablet Take 1 tablet by mouth daily.  . Calcium Carb-Cholecalciferol (CALCIUM 600 + D PO) Take 600 mg by mouth 2 (two) times daily. Pt takes 1/2 pill twice daily.  . cholecalciferol (VITAMIN D) 1000 units tablet Take 1,000 Units by mouth daily.  . clopidogrel (PLAVIX) 75 MG tablet Take 75 mg by mouth daily.   . colestipol (MICRONIZED COLESTIPOL HCL) 1 g tablet Take 1 tablet (1 g total) by mouth 2 (two) times daily. (Patient taking differently: Take 1 g by mouth as needed. )  . levothyroxine (SYNTHROID, LEVOTHROID) 75 MCG tablet TAKE 1 TABLET BY MOUTH EVERY DAY  . metoprolol tartrate (LOPRESSOR) 25 MG tablet Take 12.5 mg by mouth 2 (two) times daily.  . Multiple Vitamins-Minerals (MULTIVITAMIN ADULT PO) Take 1 tablet by mouth 2 (two) times a week.  . nitroGLYCERIN (NITROSTAT) 0.4 MG SL tablet Place 0.4 mg under the tongue every 5 (five) minutes as needed. For chest pain  . ranitidine (ZANTAC) 150 MG capsule Take 1 capsule (150 mg total) by mouth every evening. For acid protection (Patient taking differently: Take 150 mg by mouth as needed. For acid protection)  . [DISCONTINUED] spironolactone (ALDACTONE) 25 MG tablet TAKE 1 TABLET BY MOUTH EVERY DAY   Past Medical History:  Diagnosis Date  . Anemia 12/28/2015  . CAD (coronary artery disease)   . Chicken pox 83 yrs old  . Cirrhosis  of liver ([place]) 06/29/2016  . Decreased breath sounds 05/27/2016  . Gout 04/24/2015  . High cholesterol   . Hypertension   . Measles as a child  . Medicare annual wellness visit, subsequent 10/31/2012   Living will wife and daughter have a copy, does not want heroic measures Sees cardiology his recent cardiologist is retiring his new doctor will be at [employer]   . Neuropathy   . Osteoporosis 12/01/2015  . Pleural effusion on right 08/01/2016  . Preventative health care 10/31/2012  . Thyroid disease    Past Surgical History:  Procedure Laterality Date  . CATARACT EXTRACTION, BILATERAL     August & November 2012  . FOOT SURGERY Left   . HERNIA REPAIR    . PELVIC ABCESS DRAINAGE     states surgery affected control of BM  . TONSILLECTOMY     Social  History   Social History  . Marital status: Married    Spouse name: N/A  . Number of children: N/A  . Years of education: N/A   Occupational History  . Semi-retired Chief Financial Officer    Social History Main Topics  . Smoking status: Never Smoker  . Smokeless tobacco: Never Used  . Alcohol use 1.2 oz/week    2 Shots of liquor per week     Comment: daily  . Drug use: No   Social History Narrative   Married second time he has 3 daughters. He is a were semiretired Civil engineer, contracting.   family history includes Cancer in his daughter and maternal grandmother; Heart disease in his father; Hypertension in his mother; Stroke in his mother.   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 ENT:   Mouth and posterior pharynx free of lesions.  Neck:   supple w/o thyromegaly or mass.  Lungs: Clear to  auscultation on left decreased breath sounds and dullness R base Heart:  S1S2, no rubs, murmurs, gallops. Abdomen:  soft, non-tender, no hepatosplenomegaly, hernia, or mass and BS+.  Mild - mod ascites suspected Lymph:  no cervical or supraclavicular adenopathy. Extremities:   no edema, cyanosis or clubbing Skin   no rash. No CLD Neuro:  A&O x 3.  Psych:  appropriate mood and  Affect.   Data Reviewed:  See history of present illness I've also reviewed primary care and pulmonary notes and labs in the computer from this year. I reviewed images personally, of the MRI and the CT of the chest.

## 2016-09-20 NOTE — Patient Instructions (Addendum)
  You have been scheduled for an endoscopy. Please follow written instructions given to you at your visit today. If you use inhalers (even only as needed), please bring them with you on the day of your procedure.  Do not stop your Plavix per Dr Carlean Purl.   Your physician has requested that you go to the basement for the lab work before leaving today.    We have sent the following medications to your pharmacy for you to pick up at your convenience: Spirolactone, you may use up what you have at home first   Limit your sodium intake to 2 grams per day, handout provided.  Limit your fluid intake to 1500 CC per day.  Stop your V8 juice per Dr Carlean Purl.      I appreciate the opportunity to care for you. Silvano Rusk, MD, [facility]

## 2016-09-21 ENCOUNTER — Encounter: Payer: Self-pay | Admitting: *Deleted

## 2016-09-21 ENCOUNTER — Ambulatory Visit (INDEPENDENT_AMBULATORY_CARE_PROVIDER_SITE_OTHER): Payer: Medicare Other | Admitting: *Deleted

## 2016-09-21 VITALS — BP 104/50 | HR 59 | Ht 68.0 in | Wt 149.2 lb

## 2016-09-21 DIAGNOSIS — Z0000 Encounter for general adult medical examination without abnormal findings: Secondary | ICD-10-CM

## 2016-09-21 LAB — HEPATITIS B SURFACE ANTIBODY,QUALITATIVE: Hep B S Ab: NONREACTIVE

## 2016-09-21 LAB — HEPATITIS C ANTIBODY: HCV Ab: NONREACTIVE

## 2016-09-21 LAB — HEPATITIS A ANTIBODY, TOTAL: Hep A Total Ab: NONREACTIVE

## 2016-09-21 LAB — ANA: Anti Nuclear Antibody(ANA): NEGATIVE

## 2016-09-21 LAB — ANTI-SMOOTH MUSCLE ANTIBODY, IGG

## 2016-09-21 LAB — HEPATITIS B CORE ANTIBODY, TOTAL: Hep B Core Total Ab: NONREACTIVE

## 2016-09-21 NOTE — Patient Instructions (Signed)
Fernando Liu , Thank you for taking time to come for your Medicare Wellness Visit. I appreciate your ongoing commitment to your health goals. Please review the following plan we discussed and let me know if I can assist you in the future.   These are the goals we discussed: Goals    . Move back to [place]       This is a list of the screening recommended for you and due dates:  Health Maintenance  Topic Date Due  . Flu Shot  08/25/2016  . Pneumonia vaccines (2 of 2 - PPSV23) 09/21/2017*  . Tetanus Vaccine  01/20/2021  *Topic was postponed. The date shown is not the original due date.   Follow up with Dr.Blyth as scheduled 10/22/16.  Continue to eat heart healthy diet (full of fruits, vegetables, whole grains, lean protein, water--limit salt, fat, and sugar intake) and increase physical activity as tolerated.  Continue doing brain stimulating activities (puzzles, reading, adult coloring books, staying active) to keep memory sharp.   Check your medication Spironolactone and make sure you are taking the correct dosage.   Health Maintenance, Male A healthy lifestyle and preventive care is important for your health and wellness. Ask your health care provider about what schedule of regular examinations is right for you. What should I know about weight and diet? Eat a Healthy Diet  Eat plenty of vegetables, fruits, whole grains, low-fat dairy products, and lean protein.  Do not eat a lot of foods high in solid fats, added sugars, or salt.  Maintain a Healthy Weight Regular exercise can help you achieve or maintain a healthy weight. You should:  Do at least 150 minutes of exercise each week. The exercise should increase your heart rate and make you sweat (moderate-intensity exercise).  Do strength-training exercises at least twice a week.  Watch Your Levels of Cholesterol and Blood Lipids  Have your blood tested for lipids and cholesterol every 5 years starting at 81 years of  age. If you are at high risk for heart disease, you should start having your blood tested when you are 81 years old. You may need to have your cholesterol levels checked more often if: ? Your lipid or cholesterol levels are high. ? You are older than 80 years of age. ? You are at high risk for heart disease.  What should I know about cancer screening? Many types of cancers can be detected early and may often be prevented. Lung Cancer  You should be screened every year for lung cancer if: ? You are a current smoker who has smoked for at least 30 years. ? You are a former smoker who has quit within the past 15 years.  Talk to your health care provider about your screening options, when you should start screening, and how often you should be screened.  Colorectal Cancer  Routine colorectal cancer screening usually begins at 81 years of age and should be repeated every 5-10 years until you are 81 years old. You may need to be screened more often if early forms of precancerous polyps or small growths are found. Your health care provider may recommend screening at an earlier age if you have risk factors for colon cancer.  Your health care provider may recommend using home test kits to check for hidden blood in the stool.  A small camera at the end of a tube can be used to examine your colon (sigmoidoscopy or colonoscopy). This checks for the earliest forms of  colorectal cancer.  Prostate and Testicular Cancer  Depending on your age and overall health, your health care provider may do certain tests to screen for prostate and testicular cancer.  Talk to your health care provider about any symptoms or concerns you have about testicular or prostate cancer.  Skin Cancer  Check your skin from head to toe regularly.  Tell your health care provider about any new moles or changes in moles, especially if: ? There is a change in a mole's size, shape, or color. ? You have a mole that is larger than  a pencil eraser.  Always use sunscreen. Apply sunscreen liberally and repeat throughout the day.  Protect yourself by wearing long sleeves, pants, a wide-brimmed hat, and sunglasses when outside.  What should I know about heart disease, diabetes, and high blood pressure?  If you are 44-53 years of age, have your blood pressure checked every 3-5 years. If you are 56 years of age or older, have your blood pressure checked every year. You should have your blood pressure measured twice-once when you are at a hospital or clinic, and once when you are not at a hospital or clinic. Record the average of the two measurements. To check your blood pressure when you are not at a hospital or clinic, you can use: ? An automated blood pressure machine at a pharmacy. ? A home blood pressure monitor.  Talk to your health care provider about your target blood pressure.  If you are between 31-50 years old, ask your health care provider if you should take aspirin to prevent heart disease.  Have regular diabetes screenings by checking your fasting blood sugar level. ? If you are at a normal weight and have a low risk for diabetes, have this test once every three years after the age of 22. ? If you are overweight and have a high risk for diabetes, consider being tested at a younger age or more often.  A one-time screening for abdominal aortic aneurysm (AAA) by ultrasound is recommended for men aged 81-75 years who are current or former smokers. What should I know about preventing infection? Hepatitis B If you have a higher risk for hepatitis B, you should be screened for this virus. Talk with your health care provider to find out if you are at risk for hepatitis B infection. Hepatitis C Blood testing is recommended for:  Everyone born from [birth year] through 1963.  Anyone with known risk factors for hepatitis C.  Sexually Transmitted Diseases (STDs)  You should be screened each year for STDs including gonorrhea  and chlamydia if: ? You are sexually active and are younger than 81 years of age. ? You are older than 81 years of age and your health care provider tells you that you are at risk for this type of infection. ? Your sexual activity has changed since you were last screened and you are at an increased risk for chlamydia or gonorrhea. Ask your health care provider if you are at risk.  Talk with your health care provider about whether you are at high risk of being infected with HIV. Your health care provider may recommend a prescription medicine to help prevent HIV infection.  What else can I do?  Schedule regular health, dental, and eye exams.  Stay current with your vaccines (immunizations).  Do not use any tobacco products, such as cigarettes, chewing tobacco, and e-cigarettes. If you need help quitting, ask your health care provider.  Limit alcohol intake to no  more than 2 drinks per day. One drink equals 12 ounces of beer, 5 ounces of wine, or 1 ounces of hard liquor.  Do not use street drugs.  Do not share needles.  Ask your health care provider for help if you need support or information about quitting drugs.  Tell your health care provider if you often feel depressed.  Tell your health care provider if you have ever been abused or do not feel safe at home. This information is not intended to replace advice given to you by your health care provider. Make sure you discuss any questions you have with your health care provider. Document Released: 07/10/2007 Document Revised: 09/10/2015 Document Reviewed: 10/15/2014 Elsevier Interactive Patient Education  [employer].

## 2016-09-22 LAB — HEPATITIS B E ANTIBODY: Hepatitis Be Antibody: NONREACTIVE

## 2016-09-22 NOTE — Progress Notes (Signed)
His lab tests are negative. Will review EGD tomorrow.

## 2016-09-23 ENCOUNTER — Encounter: Payer: Self-pay | Admitting: Internal Medicine

## 2016-09-23 ENCOUNTER — Ambulatory Visit (AMBULATORY_SURGERY_CENTER): Payer: Medicare Other | Admitting: Internal Medicine

## 2016-09-23 VITALS — BP 117/40 | HR 52 | Temp 97.8°F | Resp 14 | Ht 68.0 in | Wt 149.0 lb

## 2016-09-23 DIAGNOSIS — I2510 Atherosclerotic heart disease of native coronary artery without angina pectoris: Secondary | ICD-10-CM | POA: Diagnosis not present

## 2016-09-23 DIAGNOSIS — K229 Disease of esophagus, unspecified: Secondary | ICD-10-CM

## 2016-09-23 DIAGNOSIS — C158 Malignant neoplasm of overlapping sites of esophagus: Secondary | ICD-10-CM | POA: Diagnosis not present

## 2016-09-23 DIAGNOSIS — K7460 Unspecified cirrhosis of liver: Secondary | ICD-10-CM | POA: Diagnosis not present

## 2016-09-23 DIAGNOSIS — R933 Abnormal findings on diagnostic imaging of other parts of digestive tract: Secondary | ICD-10-CM

## 2016-09-23 DIAGNOSIS — K228 Other specified diseases of esophagus: Secondary | ICD-10-CM

## 2016-09-23 DIAGNOSIS — I10 Essential (primary) hypertension: Secondary | ICD-10-CM | POA: Diagnosis not present

## 2016-09-23 DIAGNOSIS — R630 Anorexia: Secondary | ICD-10-CM | POA: Diagnosis not present

## 2016-09-23 DIAGNOSIS — K2289 Other specified disease of esophagus: Secondary | ICD-10-CM

## 2016-09-23 DIAGNOSIS — R188 Other ascites: Secondary | ICD-10-CM

## 2016-09-23 MED ORDER — SODIUM CHLORIDE 0.9 % IV SOLN: 500.0000 mL | INTRAVENOUS | Status: DC

## 2016-09-23 NOTE — Progress Notes (Signed)
Called to room to assist during endoscopic procedure.  Patient ID and intended procedure confirmed with present staff. Received instructions for my participation in the procedure from the performing physician.  

## 2016-09-23 NOTE — Op Note (Addendum)
[name] Patient Name: Fernando Liu Procedure Date: 09/23/2016 9:58 AM MRN: [id number] Endoscopist: Gatha Mayer , MD Age: 83 Referring MD:  Date of Birth: 18-Apr-1926 Gender: Male Account #: [id number] Procedure:                Upper GI endoscopy Indications:              Cirrhosis rule out esophageal varices, Abnormal CT                            of the GI tract Medicines:                Propofol per Anesthesia, Monitored Anesthesia Care Procedure:                Pre-Anesthesia Assessment:                           - Prior to the procedure, a History and Physical                            was performed, and patient medications and                            allergies were reviewed. The patient's tolerance of                            previous anesthesia was also reviewed. The risks                            and benefits of the procedure and the sedation                            options and risks were discussed with the patient.                            All questions were answered, and informed consent                            was obtained. Prior Anticoagulants: The patient                            last took Plavix (clopidogrel) on the day of the                            procedure. ASA Grade Assessment: III - A patient                            with severe systemic disease. After reviewing the                            risks and benefits, the patient was deemed in                            satisfactory condition to undergo the procedure.  After obtaining informed consent, the endoscope was                            passed under direct vision. Throughout the                            procedure, the patient's blood pressure, pulse, and                            oxygen saturations were monitored continuously. The                            Endoscope was introduced through the mouth, and                            advanced to  the second part of duodenum. The upper                            GI endoscopy was accomplished without difficulty.                            The patient tolerated the procedure well. Scope In: Scope Out: Findings:                 A large, fungating and ulcerating mass with no                            bleeding and stigmata of recent bleeding was found                            in the middle third of the esophagus and in the                            lower third of the esophagus, 35 - 40 cm from the                            incisors. The mass was non-obstructing and                            partially circumferential (involving one-half of                            the lumen circumference).                           The Z-line was regular and was found 45 cm from the                            incisors.                           Diffuse mild mucosal changes characterized by  congestion and mosaic pattern were found in the                            cardia, in the gastric fundus and in the gastric                            body.                           The exam was otherwise without abnormality.                           The cardia and gastric fundus were normal on                            retroflexion. Complications:            No immediate complications. Estimated Blood Loss:     Estimated blood loss was minimal. 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                           - Z-line regular, 45 cm from the incisors.                           - Congestion and mosaic pattern mucosa in the                            cardia, gastric fundus and gastric body.                           - The examination was otherwise normal.                           - No specimens collected. Recommendation:            - Patient has a contact number available for                            emergencies. The signs and symptoms of potential                            delayed complications were discussed with the                            patient. Return to normal activities tomorrow.                            Written discharge instructions were provided to the  patient.                           - Resume previous diet.                           - Continue present medications.                           - Await pathology results.                           - Await path - anticipate will need CT abdomen and                            pelvis, had a chest CT recently, may need to repeat                            that though; and oncology referrals.                           - Discontinue Plavix (clopidogrel). Bleeding risk                            from this ulcerated mass. Gatha Mayer, MD 09/23/2016 10:21:45 AM This report has been signed electronically.

## 2016-09-23 NOTE — Progress Notes (Signed)
Report given to PACU, vss 

## 2016-09-23 NOTE — Patient Instructions (Addendum)
Unfortunately there is an ulcerated mass in the esophagus that looks like cancer to me.  I took biopsies and hope to have that information by tomorrow.  After we know those results will plan next steps which would most likely  include seeing cancer specialists and having some other imaging.  Sorry for this news but I will work with you to get the help you need.  I appreciate the opportunity to care for you. Gatha Mayer, MD, [facility]   Discharge instructions given. Biopsies taken. Contrast given in recovery room. Stop Plavix. Resume previous medications. YOU HAD AN ENDOSCOPIC PROCEDURE TODAY AT [facility]:   Refer to the procedure report that was given to you for any specific questions about what was found during the examination.  If the procedure report does not answer your questions, please call your gastroenterologist to clarify.  If you requested that your care partner not be given the details of your procedure findings, then the procedure report has been included in a sealed envelope for you to review at your convenience later.  YOU SHOULD EXPECT: Some feelings of bloating in the abdomen. Passage of more gas than usual.  Walking can help get rid of the air that was put into your GI tract during the procedure and reduce the bloating. If you had a lower endoscopy (such as a colonoscopy or flexible sigmoidoscopy) you may notice spotting of blood in your stool or on the toilet paper. If you underwent a bowel prep for your procedure, you may not have a normal bowel movement for a few days.  Please Note:  You might notice some irritation and congestion in your nose or some drainage.  This is from the oxygen used during your procedure.  There is no need for concern and it should clear up in a day or so.  SYMPTOMS TO REPORT IMMEDIATELY:    Following upper endoscopy (EGD)  Vomiting of blood or coffee ground material  New chest pain or pain under the shoulder  blades  Painful or persistently difficult swallowing  New shortness of breath  Fever of 100F or higher  Black, tarry-looking stools  For urgent or emergent issues, a gastroenterologist can be reached at any hour by calling [phone].   DIET:  We do recommend a small meal at first, but then you may proceed to your regular diet.  Drink plenty of fluids but you should avoid alcoholic beverages for 24 hours.  ACTIVITY:  You should plan to take it easy for the rest of today and you should NOT DRIVE or use heavy machinery until tomorrow (because of the sedation medicines used during the test).    FOLLOW UP: Our staff will call the number listed on your records the next business day following your procedure to check on you and address any questions or concerns that you may have regarding the information given to you following your procedure. If we do not reach you, we will leave a message.  However, if you are feeling well and you are not experiencing any problems, there is no need to return our call.  We will assume that you have returned to your regular daily activities without incident.  If any biopsies were taken you will be contacted by phone or by letter within the next 1-3 weeks.  Please call us at [phone] if you have not heard about the biopsies in 3 weeks.    SIGNATURES/CONFIDENTIALITY: You and/or your care partner have signed  paperwork which will be entered into your electronic medical record.  These signatures attest to the fact that that the information above on your After Visit Summary has been reviewed and is understood.  Full responsibility of the confidentiality of this discharge information lies with you and/or your care-partner.

## 2016-09-23 NOTE — Progress Notes (Signed)
Pt's states no medical or surgical changes since previsit or office visit. 

## 2016-09-24 ENCOUNTER — Telehealth: Payer: Self-pay | Admitting: *Deleted

## 2016-09-24 ENCOUNTER — Telehealth: Payer: Self-pay | Admitting: Internal Medicine

## 2016-09-24 ENCOUNTER — Encounter: Payer: Self-pay | Admitting: Internal Medicine

## 2016-09-24 ENCOUNTER — Encounter: Payer: Self-pay | Admitting: Radiation Oncology

## 2016-09-24 DIAGNOSIS — C159 Malignant neoplasm of esophagus, unspecified: Secondary | ICD-10-CM

## 2016-09-24 DIAGNOSIS — C155 Malignant neoplasm of lower third of esophagus: Secondary | ICD-10-CM | POA: Insufficient documentation

## 2016-09-24 HISTORY — DX: Malignant neoplasm of esophagus, unspecified: C15.9

## 2016-09-24 NOTE — Telephone Encounter (Signed)
Patient returned call and said he is doing good after his procedure

## 2016-09-24 NOTE — Telephone Encounter (Signed)
Reviewed with the patient all of the appt details for CT and radiation oncology.  He is aware that he will hear from medical oncology next week for an appt with Dr. Burr Medico or Benay Spice

## 2016-09-24 NOTE — Telephone Encounter (Signed)
  Follow up Call-  Call back number 09/23/2016  Post procedure Call Back phone  # [phone]  Permission to leave phone message Yes  Some recent data might be hidden     Patient questions:  Do you have a fever, pain , or abdominal swelling? No. Pain Score  0 *  Have you tolerated food without any problems? Yes.    Have you been able to return to your normal activities? Yes.    Do you have any questions about your discharge instructions: Diet   No. Medications  No. Follow up visit  No.  Do you have questions or concerns about your Care? No.  Actions: * If pain score is 4 or above: No action needed, pain <4.

## 2016-09-24 NOTE — Telephone Encounter (Signed)
Patient has been scheduled in radiation oncology for 10/05/16 10:30 with Dr. Lisbeth Renshaw CT scan has been scheduled at [facility] for 10/01/16 2:30 He will be contacted directly by oncology for appt. Patient is at the vet with his animal and can't talk.  I will call him back in about an hour

## 2016-09-24 NOTE — Telephone Encounter (Signed)
   Follow up Call-  Call back number 09/23/2016  Post procedure Call Back phone  # [phone]  Permission to leave phone message Yes  Some recent data might be hidden     LM on number that identifies pt by first and last name that we will try him later today as we want to speak to him and be sure he is well after his procedure yesterday  Lenard Galloway RN

## 2016-09-24 NOTE — Progress Notes (Signed)
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  Past/Anticipated interventions by surgeon, if any: EGD 09/23/16 Dr. Silvano Rusk, MD Hx surgery perirectal abscess years ago and has had weak spinchter since, has seen Dr. Alice Reichert in the past  Past/Anticipated interventions by medical oncology, if any: Dr. Burr Medico appt 9/11./18 at noon .  Weight changes, if any: Over the last two years he stats that he has lost about 20-30 pounds.  Bowel/Bladder complaints, if any: Frequency with bladder,denies any other issues's. Bowels diarrhea sometime   Nausea / Vomiting, if any: None  Pain issues, if any:   None  Any blood per rectum: Sometime    10/01/2016: Ct chest/abdomen/pelvis; IMPRESSION: Focal concentric wall thickening and enhancement of distal thoracic esophagus, consistent known primary esophageal carcinoma. No definite evidence of metastatic disease within the chest, abdomen, or pelvis.  Moderate right pleural effusion, mildly decreased in size since prior study.  3 cm enhancing polypoid soft tissue density along the right lateral rectal wall, highly suspicious for primary rectal carcinoma. Recommend correlation with rectal exam and colonoscopy.  Hepatic cirrhosis and findings of portal venous hypertension. Two small hypervascular masses in the right and left lobes, mildly increased and suspicious for multifocal hepatocellular carcinoma.   SAFETY ISSUES:yes  Prior radiation? NO  Is the patient on methotrexate? NO  Current Complaints/Details:Married, new dx Cirrhosis,  And liver lesion, 10 mm ,drinks about 2 glasses bourbon daily recently stopped that  left lobe,Pleural  effusion with thoracentesis,  Maternal grandmothr  Cance, daughter cancer, MothEr stroke,HTN Allergies:ASA  Vitals:   10/05/16 1027  BP: (!) 137/45  Pulse: 64  Resp: 20  Temp: (!) 97.5 F (36.4 C)  TempSrc: Oral  SpO2: 99%  Weight: 150 lb (68 kg)   Wt Readings from Last 3 Encounters:  10/05/16 150 lb (68 kg)  09/23/16 149 lb (67.6 kg)  09/21/16 149 lb 3.2 oz (67.7 kg)

## 2016-09-24 NOTE — Telephone Encounter (Signed)
I called results of esophageal adenocarcinoma to patient.  He needs:  1) CT chest, abdomen and pelvis with iv contrast (we gave him oral contrast yesterday)  2) Referral to Dr. Benay Spice or Burr Medico re: adenocarcinoma of esophagus 3) Referral to Dr. Lisbeth Renshaw of radiation oncology re: adenocarcinoma of esophagus

## 2016-09-28 NOTE — Progress Notes (Signed)
Patient has been informed about dx of esophageal cancer Oncology appts being arranged

## 2016-09-29 ENCOUNTER — Encounter: Payer: Self-pay | Admitting: Hematology

## 2016-09-29 ENCOUNTER — Telehealth: Payer: Self-pay | Admitting: Internal Medicine

## 2016-09-29 NOTE — Telephone Encounter (Signed)
ROI faxed to [facility]

## 2016-09-30 NOTE — Telephone Encounter (Signed)
Patient has been scheduled with Dr. Burr Medico for 10/05/16

## 2016-10-01 ENCOUNTER — Ambulatory Visit (INDEPENDENT_AMBULATORY_CARE_PROVIDER_SITE_OTHER)
Admission: RE | Admit: 2016-10-01 | Discharge: 2016-10-01 | Disposition: A | Discharge status: Home / Self Care | Payer: Medicare Other | Source: Ambulatory Visit | Attending: Internal Medicine | Admitting: Internal Medicine

## 2016-10-01 ENCOUNTER — Encounter: Payer: Self-pay | Admitting: Internal Medicine

## 2016-10-01 DIAGNOSIS — J90 Pleural effusion, not elsewhere classified: Secondary | ICD-10-CM | POA: Diagnosis not present

## 2016-10-01 DIAGNOSIS — C159 Malignant neoplasm of esophagus, unspecified: Secondary | ICD-10-CM

## 2016-10-01 MED ORDER — IOPAMIDOL (ISOVUE-300) INJECTION 61%: 100.0000 mL | Freq: Once | INTRAVENOUS | Status: DC | PRN

## 2016-10-01 NOTE — Progress Notes (Signed)
I called results  Explained ? Of rectal cancer and liver lesions that could be cancer (mets, HCC?)  He related that he had pelvic abscess surgery yrs ago and wonders if that is whatrectal chenges could be.  Has oncology appts 9/11 and someone can do a rectal exam then for starters

## 2016-10-01 NOTE — Progress Notes (Signed)
[place]  [phone] Fax:([phone]) [place] Note   Patient Care Team: Mosie Lukes, MD as PCP - General (Family Medicine) Becky Sax, MD as Referring Physician (Orthopedic Surgery) Noralyn Pick, MD as Consulting Physician (Ophthalmology) 10/05/2016  Referring physician: Dr. Carlean Purl  CHIEF COMPLAINTS/PURPOSE OF CONSULTATION:   Adenocarcinoma of the esophagus  Oncology History   Cancer Staging Adenocarcinoma of esophagus [facility]) Staging form: Esophagus - Adenocarcinoma, AJCC 8th Edition - Clinical stage from 09/23/2016: Stage Unknown (cTX, cN0, cM0) - Signed by Truitt Merle, MD on 10/04/2016       Adenocarcinoma of esophagus ([place])   09/23/2016 Procedure    Upper Endoscopy by Dr. Carlean Purl 09/23/16 IMPRESSION - Malignant esophageal tumor was found in the middle third of the esophagus and in the lower third of the esophagus. - Z-line regular, 45 cm from the incisors. - Congestion and mosaic pattern mucosa in the cardia, gastric fundus and gastric body. - The examination was otherwise normal. - No specimens collected.      09/23/2016 Initial Biopsy    Diagnosis  09/23/16 Esophagus, biopsy, mid to distal esophageal mass - FOCAL INVASIVE ADENOCARCINOMA ASSOCIATED WITH EXTENSIVE HIGH GRADE GLANDULAR DYSPLASIA. - INTESTINAL METAPLASIA CONSISTENT WITH BACKGROUND BARRETT'S ESOPHAGUS. - SEE MICROSCOPIC DESCRIPTION      09/23/2016 Initial Diagnosis    Adenocarcinoma of esophagus ([place])      10/01/2016 Imaging    CT CAP w contrast: Focal concentric wall thickening and enhancement of distal thoracic esophagus, consistent known primary esophageal carcinoma. No definite evidence of metastatic disease within the chest, abdomen, or pelvis.  3 cm enhancing polypoid soft tissue density along the right lateral rectal wall, highly suspicious for primary rectal carcinoma. Recommend correlation with rectal exam and colonoscopy.          HISTORY OF PRESENTING ILLNESS: Fernando Liu 83 y.o. male with past medical history of liver cirrhosis, hypertension, CAD, who was referred by his gastroenterologist Dr. Carlean Purl for his recently diagnosed esophageal cancer. He presents to my clinic with his wife.   He was found to have liver cirrhosis about 2 months ago, when he was found to have right pleural effusion by his primary care physician. Further CT scan reviewed liver cirrhosis, with two indeterminate liver lesions, also liver MRI felt that this lesions are likely benign. Due to his newly diagnosed liver cirrhosis, Dr. Carlean Purl recommended endoscopy, he underwent EGD on 09/23/2016, which showed a also raises mass in the middle and lower esophagus, biopsy showed adenocarcinoma.  Patient denies dysphagia, or odynophagia. He reports mild hoarseness since 2 years ago. He is a retired Chief Financial Officer, still works Retail buyer, lives at home with his wife. He does have moderate fatigue, but is able to do ADLs, and light activities, but not very physically active. His appetite has been low for the past 2 years, and he has lost a total of 30 pounds over the past 2 years.  He had rectal absceess 20 years ago, which resulted sphincter dysfunction, he has had chronic diarrhea for many years. He started taking oral iron a few months ago, which improved his constipation. Although he is off iron now due to his gastric discomfort, his bowel movements and seems to be normal lately. He denies melena, or blood in stool.    He does drink alcohol moderately, never smoked. He is from [place], patient and his wife has been planning to move back to [place] in the near future.  REVIEW OF SYSTEMS:  Constitutional: Denies fevers, chills or abnormal night sweats, (+) 30 pounds weight loss in the past 2 years, and mild to moderate fatigue Eyes: Denies blurriness of vision, double vision or watery eyes Ears, nose, mouth, throat, and face: Denies mucositis or  sore throat Respiratory: Denies cough, dyspnea or wheezes Cardiovascular: Denies palpitation, chest discomfort or lower extremity swelling Gastrointestinal:  Denies nausea, heartburn or change in bowel habits Skin: Denies abnormal skin rashes Lymphatics: Denies new lymphadenopathy or easy bruising Neurological:Denies numbness, tingling or new weaknesses Behavioral/Psych: Mood is stable, no new changes  All other systems were reviewed with the patient and are negative.  MEDICAL HISTORY:  Past Medical History:  Diagnosis Date  . Adenocarcinoma of esophagus ([place]) 09/24/2016  . Anemia 12/28/2015  . CAD (coronary artery disease)   . Chicken pox 83 yrs old  . Cirrhosis of liver ([place]) 06/29/2016  . Decreased breath sounds 05/27/2016  . Gout 04/24/2015  . High cholesterol   . Hypertension   . Measles as a child  . Medicare annual wellness visit, subsequent 10/31/2012   Living will wife and daughter have a copy, does not want heroic measures Sees cardiology his recent cardiologist is retiring his new doctor will be at [facility]   . Neuropathy   . Osteoporosis 12/01/2015  . Pleural effusion on right 08/01/2016  . Preventative health care 10/31/2012  . Thyroid disease     SURGICAL HISTORY: Past Surgical History:  Procedure Laterality Date  . CATARACT EXTRACTION, BILATERAL     August & November 2012  . ESOPHAGOGASTRODUODENOSCOPY  08/2016   Esophageal cancer  . FOOT SURGERY Left   . HERNIA REPAIR    . PELVIC ABCESS DRAINAGE     states surgery affected control of BM  . TONSILLECTOMY      SOCIAL HISTORY: Social History   Social History  . Marital status: Married    Spouse name: N/A  . Number of children: N/A  . Years of education: N/A   Occupational History  . Semi-retired Chief Financial Officer    Social History Main Topics  . Smoking status: Never Smoker  . Smokeless tobacco: Never Used  . Alcohol use No     Comment: Used to have 2 shots of bourbon daily for many years but stopped summer 2018   . Drug use: No  . Sexual activity: Not on file   Other Topics Concern  . Not on file   Social History Narrative   Married second time he has 3 daughters. He is a were semiretired Civil engineer, contracting.    FAMILY HISTORY: Family History  Problem Relation Age of Onset  . Stroke Mother   . Hypertension Mother   . Cancer Maternal Grandmother        gastric cancer   . Heart disease Father   . Cancer Daughter        gastric cancer   . Breast cancer Neg Hx   . Diabetes Neg Hx   . Colon cancer Neg Hx   . Prostate cancer Neg Hx     ALLERGIES:  is allergic to aspirin.  MEDICATIONS:  Current Outpatient Prescriptions  Medication Sig Dispense Refill  . allopurinol (ZYLOPRIM) 100 MG tablet TAKE 1 TABLET (100 MG TOTAL) BY MOUTH DAILY. 30 tablet 6  . atorvastatin (LIPITOR) 10 MG tablet TAKE 1 TABLET BY MOUTH AT BEDTIME 30 tablet 4  . b complex vitamins tablet Take 1 tablet by mouth daily.    . Calcium Carb-Cholecalciferol (CALCIUM 600 + D PO) Take 600  mg by mouth 2 (two) times daily. Pt takes 1/2 pill twice daily.    . cholecalciferol (VITAMIN D) 1000 units tablet Take 1,000 Units by mouth daily.    Marland Kitchen levothyroxine (SYNTHROID, LEVOTHROID) 75 MCG tablet TAKE 1 TABLET BY MOUTH EVERY DAY 30 tablet 4  . metoprolol tartrate (LOPRESSOR) 25 MG tablet Take 12.5 mg by mouth 2 (two) times daily.    . Multiple Vitamins-Minerals (MULTIVITAMIN ADULT PO) Take 1 tablet by mouth 2 (two) times a week.    . ranitidine (ZANTAC) 150 MG capsule Take 1 capsule (150 mg total) by mouth every evening. For acid protection 30 capsule 2  . spironolactone (ALDACTONE) 50 MG tablet Take 1 tablet (50 mg total) by mouth daily. 90 tablet 1  . capecitabine (XELODA) 500 MG tablet Take 3 tablets (1,500 mg total) by mouth 2 (two) times daily after a meal. On the day of radiation only, Monday through Friday 90 tablet 1  . colestipol (MICRONIZED COLESTIPOL HCL) 1 g tablet Take 1 tablet (1 g total) by mouth 2 (two) times daily.  (Patient not taking: Reported on 10/05/2016) 60 tablet 2  . nitroGLYCERIN (NITROSTAT) 0.4 MG SL tablet Place 0.4 mg under the tongue every 5 (five) minutes as needed. For chest pain     No current facility-administered medications for this visit.     REVIEW OF SYSTEMS:   Constitutional: Denies fevers, chills or abnormal night sweats Eyes: Denies blurriness of vision, double vision or watery eyes Ears, nose, mouth, throat, and face: Denies mucositis or sore throat Respiratory: Denies cough, dyspnea or wheezes Cardiovascular: Denies palpitation, chest discomfort or lower extremity swelling Gastrointestinal:  Denies nausea, heartburn or change in bowel habits Skin: Denies abnormal skin rashes Lymphatics: Denies new lymphadenopathy or easy bruising Neurological:Denies numbness, tingling or new weaknesses Behavioral/Psych: Mood is stable, no new changes  All other systems were reviewed with the patient and are negative.  PHYSICAL EXAMINATION: ECOG PERFORMANCE STATUS: 2 - Symptomatic, <50% confined to bed  Vitals:   10/05/16 1217  BP: 136/61  Pulse: 70  Resp: 19  Temp: 97.7 F (36.5 C)  SpO2: 97%   Filed Weights   10/05/16 1217  Weight: 149 lb 8 oz (67.8 kg)   GENERAL:alert, no distress and comfortable SKIN: skin color, texture, turgor are normal, no rashes or significant lesions EYES: normal, conjunctiva are pink and non-injected, sclera clear OROPHARYNX:no exudate, no erythema and lips, buccal mucosa, and tongue normal  NECK: supple, thyroid normal size, non-tender, without nodularity LYMPH:  no palpable lymphadenopathy in the cervical, axillary or inguinal LUNGS: clear to auscultation and percussion with normal breathing effort HEART: regular rate & rhythm and no murmurs and no lower extremity edema ABDOMEN:abdomen soft, non-tender and normal bowel sounds Musculoskeletal:no cyanosis of digits and no clubbing  PSYCH: alert & oriented x 3 with fluent speech NEURO: no focal  motor/sensory deficits  LABORATORY DATA:  I have reviewed the data as listed CBC Latest Ref Rng & Units 10/05/2016 09/06/2016 08/09/2016  WBC 4.0 - 10.3 10e3/uL 7.9 7.6 7.2  Hemoglobin 13.0 - 17.1 g/dL 15.5 14.6 14.6  Hematocrit 38.4 - 49.9 % 45.4 43.9 43.5  Platelets 140 - 400 10e3/uL 144 160.0 176.0   CMP Latest Ref Rng & Units 10/05/2016 09/06/2016 08/09/2016  Glucose 70 - 140 mg/dl 102 101(H) 97  BUN 7.0 - 26.0 mg/dL 38.6(H) 36(H) 18  Creatinine 0.7 - 1.3 mg/dL 1.2 1.24 1.26  Sodium 136 - 145 mEq/L 139 138 139  Potassium 3.5 -  5.1 mEq/L 4.9 4.2 4.3  Chloride 96 - 112 mEq/L - 103 106  CO2 22 - 29 mEq/L 25 28 28   Calcium 8.4 - 10.4 mg/dL 10.1 9.5 9.1  Total Protein 6.4 - 8.3 g/dL 7.4 6.2 6.2  Total Bilirubin 0.20 - 1.20 mg/dL 0.81 1.1 0.9  Alkaline Phos 40 - 150 U/L 179(H) 137(H) 160(H)  AST 5 - 34 U/L 44(H) 32 34  ALT 0 - 55 U/L 49 28 26   CEA 10/05/16 - 2.41  PATHOLOGY  Diagnosis  09/23/16 Esophagus, biopsy, mid to distal esophageal mass - FOCAL INVASIVE ADENOCARCINOMA ASSOCIATED WITH EXTENSIVE HIGH GRADE GLANDULAR DYSPLASIA. - INTESTINAL METAPLASIA CONSISTENT WITH BACKGROUND BARRETT'S ESOPHAGUS. - SEE MICROSCOPIC DESCRIPTION Microscopic Comment There are fragments of squamous and glandular mucosa and the glandular mucosa shows extensive high grade glandular dysplasia associated with Barrett's-type metaplasia. One of the fragments also has focal invasive adenocarcinoma within the mucosal stroma. Called to Dr Celesta Aver office on 09/24/16. (JDP:kh 09/24/16) Claudette Laws MD   PROCEDURES  Upper Endoscopy by Dr. Carlean Purl 09/23/16 IMPRESSION - Malignant esophageal tumor was found in the middle third of the esophagus and in the lower third of the esophagus. - Z-line regular, 45 cm from the incisors. - Congestion and mosaic pattern mucosa in the cardia, gastric fundus and gastric body. - The examination was otherwise normal. - No specimens collected.   RADIOGRAPHIC STUDIES: I  have personally reviewed the radiological images as listed and agreed with the findings in the report. Ct Chest W Contrast  Result Date: 10/01/2016 CLINICAL DATA:  Newly diagnosed esophageal adenocarcinoma.  Staging. EXAM: CT CHEST, ABDOMEN, AND PELVIS WITH CONTRAST TECHNIQUE: Multidetector CT imaging of the chest, abdomen and pelvis was performed following the standard protocol during bolus administration of intravenous contrast. CONTRAST:  100 mL Isovue-300 COMPARISON:  Chest CT on 06/25/2016 and AP CT on 10/17/2010 FINDINGS: CT CHEST FINDINGS Cardiovascular: No acute findings. Aortic and coronary artery atherosclerosis. Mediastinum/Lymph Nodes: Focal area of concentric wall thickening and enhancement is seen involving the distal thoracic esophagus, consistent with known esophageal carcinoma. No pathologically enlarged lymph nodes identified. Lungs/Pleura: Moderate right pleural effusion is mildly decreased in size since previous study. No evidence of pulmonary infiltrate or mass. Musculoskeletal:  No suspicious bone lesions identified. CT ABDOMEN AND PELVIS FINDINGS Hepatobiliary: Hepatic cirrhosis again demonstrated. A 1.2 cm hypervascular mass is seen in segment 2 of the left lobe which measures 1.2 cm on image 50/2. This is slightly increased in size compared to 10 mm on previous study. A second 10 mm hypervascular mass is seen in the dome of the right hepatic lobe on image 46/2 which was not seen on previous study. These lesions are suspicious for hepatocellular carcinoma in the setting of hepatic cirrhosis. Gallbladder is unremarkable. Pancreas:  No mass or inflammatory changes. Spleen:  Mild splenomegaly, consistent portal venous hypertension. Adrenals/Urinary tract: Several renal cysts again seen bilaterally. No masses or hydronephrosis. Stomach/Bowel: Enhancing polypoid soft tissue density seen along the right lateral rectal wall measuring 2.5 x 3.1 cm, and highly suspicious for rectal carcinoma. No  evidence of bowel obstruction. Diverticulosis seen mainly involving the descending sigmoid colon, however there is no evidence of diverticulitis. Normal appendix visualized. Vascular/Lymphatic: No pathologically enlarged lymph nodes identified. No abdominal aortic aneurysm. Aortic atherosclerosis. Reproductive:  No mass or other significant abnormality identified. Other:  Stable small fat-containing right inguinal hernia. Musculoskeletal:  No suspicious bone lesions identified. IMPRESSION: Focal concentric wall thickening and enhancement of distal thoracic esophagus, consistent known primary  esophageal carcinoma. No definite evidence of metastatic disease within the chest, abdomen, or pelvis. Moderate right pleural effusion, mildly decreased in size since prior study. 3 cm enhancing polypoid soft tissue density along the right lateral rectal wall, highly suspicious for primary rectal carcinoma. Recommend correlation with rectal exam and colonoscopy. Hepatic cirrhosis and findings of portal venous hypertension. Two small hypervascular masses in the right and left lobes, mildly increased and suspicious for multifocal hepatocellular carcinoma. Other incidental findings described above. Electronically Signed   By: Earle Gell M.D.   On: 10/01/2016 16:22   Ct Abdomen Pelvis W Contrast  Result Date: 10/01/2016 CLINICAL DATA:  Newly diagnosed esophageal adenocarcinoma.  Staging. EXAM: CT CHEST, ABDOMEN, AND PELVIS WITH CONTRAST TECHNIQUE: Multidetector CT imaging of the chest, abdomen and pelvis was performed following the standard protocol during bolus administration of intravenous contrast. CONTRAST:  100 mL Isovue-300 COMPARISON:  Chest CT on 06/25/2016 and AP CT on 10/17/2010 FINDINGS: CT CHEST FINDINGS Cardiovascular: No acute findings. Aortic and coronary artery atherosclerosis. Mediastinum/Lymph Nodes: Focal area of concentric wall thickening and enhancement is seen involving the distal thoracic esophagus,  consistent with known esophageal carcinoma. No pathologically enlarged lymph nodes identified. Lungs/Pleura: Moderate right pleural effusion is mildly decreased in size since previous study. No evidence of pulmonary infiltrate or mass. Musculoskeletal:  No suspicious bone lesions identified. CT ABDOMEN AND PELVIS FINDINGS Hepatobiliary: Hepatic cirrhosis again demonstrated. A 1.2 cm hypervascular mass is seen in segment 2 of the left lobe which measures 1.2 cm on image 50/2. This is slightly increased in size compared to 10 mm on previous study. A second 10 mm hypervascular mass is seen in the dome of the right hepatic lobe on image 46/2 which was not seen on previous study. These lesions are suspicious for hepatocellular carcinoma in the setting of hepatic cirrhosis. Gallbladder is unremarkable. Pancreas:  No mass or inflammatory changes. Spleen:  Mild splenomegaly, consistent portal venous hypertension. Adrenals/Urinary tract: Several renal cysts again seen bilaterally. No masses or hydronephrosis. Stomach/Bowel: Enhancing polypoid soft tissue density seen along the right lateral rectal wall measuring 2.5 x 3.1 cm, and highly suspicious for rectal carcinoma. No evidence of bowel obstruction. Diverticulosis seen mainly involving the descending sigmoid colon, however there is no evidence of diverticulitis. Normal appendix visualized. Vascular/Lymphatic: No pathologically enlarged lymph nodes identified. No abdominal aortic aneurysm. Aortic atherosclerosis. Reproductive:  No mass or other significant abnormality identified. Other:  Stable small fat-containing right inguinal hernia. Musculoskeletal:  No suspicious bone lesions identified. IMPRESSION: Focal concentric wall thickening and enhancement of distal thoracic esophagus, consistent known primary esophageal carcinoma. No definite evidence of metastatic disease within the chest, abdomen, or pelvis. Moderate right pleural effusion, mildly decreased in size since  prior study. 3 cm enhancing polypoid soft tissue density along the right lateral rectal wall, highly suspicious for primary rectal carcinoma. Recommend correlation with rectal exam and colonoscopy. Hepatic cirrhosis and findings of portal venous hypertension. Two small hypervascular masses in the right and left lobes, mildly increased and suspicious for multifocal hepatocellular carcinoma. Other incidental findings described above. Electronically Signed   By: Earle Gell M.D.   On: 10/01/2016 16:22     CT CAP W Contrast 10/01/16  Focal concentric wall thickening and enhancement of distal thoracic esophagus, consistent known primary esophageal carcinoma. No definite evidence of metastatic disease within the chest, abdomen, or pelvis.  Moderate right pleural effusion, mildly decreased in size since prior study.  3 cm enhancing polypoid soft tissue density along the right lateral  rectal wall, highly suspicious for primary rectal carcinoma. Recommend correlation with rectal exam and colonoscopy.  Hepatic cirrhosis and findings of portal venous hypertension. Two small hypervascular masses in the right and left lobes, mildly increased and suspicious for multifocal hepatocellular carcinoma.  MR liver w wo contrast 07/31/2016 IMPRESSION: 1. Cirrhosis and portal venous hypertension with splenomegaly. 2. Arterial and portal venous phase focus of hyper enhancement in the high left hepatic lobe is likely a perfusion anomaly. Consider follow-up with pre and post contrast abdominal MRI at 3-6 months. 3. Otherwise, no liver lesion identified. Outside ultrasound not submitted. 4.  Aortic Atherosclerosis (ICD10-I70.0). 5. Moderate right pleural effusion with small volume perihepatic ascites.  ASSESSMENT & PLAN:  Fernando Liu is a 83 y.o. male with a history of Anemia,CAD, Cirrhosis of Liver, HTN, Neuropathy, Osteoporosis, Pleural effusion and thyroid disease.  1. Adenocarcinoma of the esophagus,  min-low esophagus, [id number] -I discussed the pathology results with the patient in detail -His CT scan reviewed no evidence of distant metastasis, this is reviewed with patient. -For more detailed staging, Dr. Lisbeth Renshaw has ordered a PET scan.  -I discussed that while surgery is the primary treatment for esophageal cancer, he is not an optimal candidate due to his advanced age and comorbidities and liver cirrhosis, I explained the surgery would be extensive due to having to remove a large portion of his esophagus, and possible part of his stomach to obtain clear margins.  -I encouraged him to consider radiation, which is likely to be palliative to prevent and delay his dysphagia, and bleeding from the esophageal cancer. -I discussed the benefit of chemotherapy with concurrent radiation. I discussed this is a small possibility he may have a complete response to chemoradiation, but would likely require more intensive chemotherapy, such as FOLFOX. Due to his advanced age and comorbidities, especially his liver cirrhosis, I do not think he is a good candidate for intensive intravenous chemotherapy, he may not be able tolerate weekly carbo and Taxol well. I discussed the alternative chemotherapy option, such as Xeloda, to increase the efficacy of radiation. Given the palliative nature of his treatment, I think Xeloda is a better option.  -Due to his age and comorbidities, I recommend Xeloda concurrent with radiation, hold on weekends.  -I discussed potential side effects of xeloda including but not limited to fatigue, nausea, diarrhea, inflammation/pain in the abdomen, low blood counts, treatment interruptions, skin toxicity, and mouth sores.   -I discussed in the presence of liver cirrhosis his side effects may be increased, we will monitor closely. He agrees to proceed. -I sent prescription to specialty pharmacy so he can get financial assistance if needed, he will plan to start in approx 2 weeks with the start of  radiation -We'll monitor his lab weekly during his treatment, and I will see him every 1-2 weeks.  2.  Rectal lesion on CT  -rectal exam negative for masses; no blood -I recommend colonoscopy or sigmoidoscopy for further evaluation, will send him back to Dr. Lilyan Punt.  3.  Cryptogenic liver cirrhosis with ascites -followed by GI, Dr. Carlean Purl -He does have 2 indeterminate liver lesions, likely benign based on the MRI, he will be followed by Dr. Carlean Purl  4. CAD, HTN -followed by cardiology, Dr. Donnetta Hutching at [facility] -on metoprolol, aldactone, lipitor; nitro PRN -BP 136/61 today, stable; will monitor  PLAN:  -PET scan ordered -colonoscopy/sigmoidoscopy per Dr. Carlean Purl -Prescription of Xeloda was sent out today -chemo class in 1-2 weeks -lab today  -F/u 9/24, or first day  of his chemoRT   Orders Placed This Encounter  Procedures  . CBC with Differential    Standing Status:   Standing    Number of Occurrences:   50    Standing Expiration Date:   10/05/2021  . Comprehensive metabolic panel    Standing Status:   Standing    Number of Occurrences:   50    Standing Expiration Date:   10/05/2021  . CEA    Standing Status:   Future    Number of Occurrences:   1    Standing Expiration Date:   10/05/2017    All questions were answered. The patient knows to call the clinic with any problems, questions or concerns. I spent 55 minutes counseling the patient face to face. The total time spent in the appointment was 60 minutes and more than 50% was on counseling.  This document serves as a record of services personally performed by Truitt Merle, MD. It was created on her behalf by Joslyn Devon, a trained medical scribe. The creation of this record is based on the scribe's personal observations and the provider's statements to them. This document has been checked and approved by the attending provider.     Truitt Merle, MD 10/05/2016 4:06 PM

## 2016-10-04 NOTE — Telephone Encounter (Signed)
Rec'd from [facility] forwarded 6 pages to Gatha Mayer MD

## 2016-10-05 ENCOUNTER — Ambulatory Visit (HOSPITAL_BASED_OUTPATIENT_CLINIC_OR_DEPARTMENT_OTHER): Payer: Medicare Other

## 2016-10-05 ENCOUNTER — Ambulatory Visit (HOSPITAL_BASED_OUTPATIENT_CLINIC_OR_DEPARTMENT_OTHER): Payer: Medicare Other | Admitting: Hematology

## 2016-10-05 ENCOUNTER — Ambulatory Visit
Admission: RE | Admit: 2016-10-05 | Discharge: 2016-10-05 | Disposition: A | Discharge status: Home / Self Care | Payer: Medicare Other | Source: Ambulatory Visit | Attending: Radiation Oncology | Admitting: Radiation Oncology

## 2016-10-05 ENCOUNTER — Encounter: Payer: Self-pay | Admitting: Radiation Oncology

## 2016-10-05 ENCOUNTER — Telehealth: Payer: Self-pay | Admitting: Hematology

## 2016-10-05 ENCOUNTER — Encounter: Payer: Self-pay | Admitting: Hematology

## 2016-10-05 VITALS — BP 137/45 | HR 64 | Temp 97.5°F | Resp 20 | Wt 150.0 lb

## 2016-10-05 VITALS — BP 136/61 | HR 70 | Temp 97.7°F | Resp 19 | Wt 149.5 lb

## 2016-10-05 DIAGNOSIS — Z800 Family history of malignant neoplasm of digestive organs: Secondary | ICD-10-CM | POA: Diagnosis not present

## 2016-10-05 DIAGNOSIS — Z510 Encounter for antineoplastic radiation therapy: Secondary | ICD-10-CM | POA: Insufficient documentation

## 2016-10-05 DIAGNOSIS — E079 Disorder of thyroid, unspecified: Secondary | ICD-10-CM | POA: Diagnosis not present

## 2016-10-05 DIAGNOSIS — K629 Disease of anus and rectum, unspecified: Secondary | ICD-10-CM

## 2016-10-05 DIAGNOSIS — M109 Gout, unspecified: Secondary | ICD-10-CM | POA: Diagnosis not present

## 2016-10-05 DIAGNOSIS — Z8249 Family history of ischemic heart disease and other diseases of the circulatory system: Secondary | ICD-10-CM | POA: Insufficient documentation

## 2016-10-05 DIAGNOSIS — D649 Anemia, unspecified: Secondary | ICD-10-CM

## 2016-10-05 DIAGNOSIS — Z79890 Hormone replacement therapy (postmenopausal): Secondary | ICD-10-CM | POA: Insufficient documentation

## 2016-10-05 DIAGNOSIS — Z79899 Other long term (current) drug therapy: Secondary | ICD-10-CM | POA: Insufficient documentation

## 2016-10-05 DIAGNOSIS — C155 Malignant neoplasm of lower third of esophagus: Secondary | ICD-10-CM | POA: Insufficient documentation

## 2016-10-05 DIAGNOSIS — C153 Malignant neoplasm of upper third of esophagus: Secondary | ICD-10-CM | POA: Diagnosis not present

## 2016-10-05 DIAGNOSIS — K769 Liver disease, unspecified: Secondary | ICD-10-CM | POA: Insufficient documentation

## 2016-10-05 DIAGNOSIS — I2510 Atherosclerotic heart disease of native coronary artery without angina pectoris: Secondary | ICD-10-CM | POA: Diagnosis not present

## 2016-10-05 DIAGNOSIS — C154 Malignant neoplasm of middle third of esophagus: Secondary | ICD-10-CM

## 2016-10-05 DIAGNOSIS — Z886 Allergy status to analgesic agent status: Secondary | ICD-10-CM | POA: Insufficient documentation

## 2016-10-05 DIAGNOSIS — I10 Essential (primary) hypertension: Secondary | ICD-10-CM | POA: Insufficient documentation

## 2016-10-05 DIAGNOSIS — K7469 Other cirrhosis of liver: Secondary | ICD-10-CM | POA: Diagnosis not present

## 2016-10-05 DIAGNOSIS — Z823 Family history of stroke: Secondary | ICD-10-CM | POA: Diagnosis not present

## 2016-10-05 DIAGNOSIS — M810 Age-related osteoporosis without current pathological fracture: Secondary | ICD-10-CM | POA: Diagnosis not present

## 2016-10-05 DIAGNOSIS — G629 Polyneuropathy, unspecified: Secondary | ICD-10-CM | POA: Insufficient documentation

## 2016-10-05 DIAGNOSIS — K7460 Unspecified cirrhosis of liver: Secondary | ICD-10-CM | POA: Diagnosis not present

## 2016-10-05 DIAGNOSIS — K6289 Other specified diseases of anus and rectum: Secondary | ICD-10-CM | POA: Diagnosis not present

## 2016-10-05 DIAGNOSIS — E7800 Pure hypercholesterolemia, unspecified: Secondary | ICD-10-CM | POA: Diagnosis not present

## 2016-10-05 DIAGNOSIS — C159 Malignant neoplasm of esophagus, unspecified: Secondary | ICD-10-CM

## 2016-10-05 LAB — COMPREHENSIVE METABOLIC PANEL
ALBUMIN: 3.6 g/dL (ref 3.5–5.0)
ALT: 49 U/L (ref 0–55)
AST: 44 U/L — AB (ref 5–34)
Alkaline Phosphatase: 179 U/L — ABNORMAL HIGH (ref 40–150)
Anion Gap: 10 mEq/L (ref 3–11)
BUN: 38.6 mg/dL — AB (ref 7.0–26.0)
CALCIUM: 10.1 mg/dL (ref 8.4–10.4)
CHLORIDE: 103 meq/L (ref 98–109)
CO2: 25 mEq/L (ref 22–29)
CREATININE: 1.2 mg/dL (ref 0.7–1.3)
EGFR: 52 mL/min/{1.73_m2} — ABNORMAL LOW (ref >90)
Glucose: 102 mg/dl (ref 70–140)
Potassium: 4.9 mEq/L (ref 3.5–5.1)
Sodium: 139 mEq/L (ref 136–145)
Total Bilirubin: 0.81 mg/dL (ref 0.20–1.20)
Total Protein: 7.4 g/dL (ref 6.4–8.3)

## 2016-10-05 LAB — CBC WITH DIFFERENTIAL/PLATELET
BASO%: 0.3 % (ref 0.0–2.0)
Basophils Absolute: 0 10*3/uL (ref 0.0–0.1)
EOS ABS: 0.4 10*3/uL (ref 0.0–0.5)
EOS%: 4.9 % (ref 0.0–7.0)
HEMATOCRIT: 45.4 % (ref 38.4–49.9)
HEMOGLOBIN: 15.5 g/dL (ref 13.0–17.1)
LYMPH%: 11 % — ABNORMAL LOW (ref 14.0–49.0)
MCH: 33 pg (ref 27.2–33.4)
MCHC: 34.1 g/dL (ref 32.0–36.0)
MCV: 96.8 fL (ref 79.3–98.0)
MONO#: 0.7 10*3/uL (ref 0.1–0.9)
MONO%: 8.9 % (ref 0.0–14.0)
NEUT#: 5.9 10*3/uL (ref 1.5–6.5)
NEUT%: 74.9 % (ref 39.0–75.0)
Platelets: 144 10*3/uL (ref 140–400)
RBC: 4.69 10*6/uL (ref 4.20–5.82)
RDW: 14.1 % (ref 11.0–14.6)
WBC: 7.9 10*3/uL (ref 4.0–10.3)
lymph#: 0.9 10*3/uL (ref 0.9–3.3)

## 2016-10-05 LAB — CEA (IN HOUSE-CHCC): CEA (CHCC-IN HOUSE): 2.41 ng/mL (ref 0.00–5.00)

## 2016-10-05 MED ORDER — CAPECITABINE 500 MG PO TABS: 825.0000 mg/m2 | ORAL_TABLET | Freq: Two times a day (BID) | ORAL | 1 refills | Status: DC

## 2016-10-05 NOTE — Telephone Encounter (Signed)
Gave avs and calendar for september °

## 2016-10-05 NOTE — Progress Notes (Signed)
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 A diagnosis of Adenocarcinoma of esophagus ([place]) was also pertinent to this visit.   HISTORY OF PRESENT ILLNESS: Fernando Liu is a 83 y.o. male seen at the request of Dr. Carlean Purl for a newly diagnosed esophageal cancer. The patient was in his usual state of health until he was seen for a routine visit by his PCP Dr. Charlett Blake who noted decreased breath sounds in the right lower lung. She sent the patient for diagnostic imaging which revealed a pleural effusion. Thoracentesis on 07/01/16 revealed no malignancy. There were concerns for cirrhosis after an ultrasound revealed a possible liver lesion and an MRI of the abdomen on 17 revealed moderate to markedly cirrhotic changes of the liver as well as a moderate right pleural effusion. Within the liver at 10 mm arterial and portal venous phase focus of hyperenhancement in the high left hepatic lobe was noted. No other focal findings were identified. He was evaluated by GI, and started on spironolactone. He underwent endoscopy to screen for varices on 09/23/16 and this revealed a mass in the lower that was described large fungating with evidence of prior stigmata from prior bleeding. The mass is non obstructing and partially circumferential involving one half of the luminal circumference, and was noted at 45 cm from the incisors. Congestion and mosaicism were found in the gastric cardia fundus, and body. Biopsy revealed invasive adenocarcinoma in a background of Barrett's esophagus.he did undergo CT imaging of the chest abdomen and pelvis on 10/01/2016, hepatic cirrhotic changes were noted with a 1.2 cm  hypervascular mass in segment 2 of the left lobe slightly increased from 10 mm on the previous study, and within the right lateral rectal wall there is a 2.5 x 3.1 cm area of soft tissue density. Apparently he has a history of a perirectal abscess that was surgically resected about 20 years ago and has had some difficulty with ongoing diarrhea since that procedure. This is controlled with Colestid. He comes today to discuss recommendations for radiotherapy as a part of his cancer treatment.  PREVIOUS RADIATION THERAPY: No   PAST MEDICAL HISTORY:  Past Medical History:  Diagnosis Date  . Adenocarcinoma of esophagus ([place]) 09/24/2016  . Anemia 12/28/2015  . CAD (coronary artery disease)   . Chicken pox 83 yrs old  . Cirrhosis of liver ([place]) 06/29/2016  . Decreased breath sounds 05/27/2016  . Gout 04/24/2015  . High cholesterol   . Hypertension   . Measles as a child  . Medicare annual wellness visit, subsequent 10/31/2012   Living will wife and daughter have a copy, does not want heroic measures Sees cardiology his recent cardiologist is retiring his new doctor will be at [employer]   . Neuropathy   . Osteoporosis 12/01/2015  . Pleural effusion on right 08/01/2016  . Preventative health care 10/31/2012  . Thyroid disease        PAST SURGICAL HISTORY: Past Surgical History:  Procedure Laterality Date  . CATARACT EXTRACTION, BILATERAL     August & November 2012  . ESOPHAGOGASTRODUODENOSCOPY  08/2016   Esophageal cancer  . FOOT SURGERY Left   .  HERNIA REPAIR    . PELVIC ABCESS DRAINAGE     states surgery affected control of BM  . TONSILLECTOMY       FAMILY HISTORY:  Family History  Problem Relation Age of Onset  . Stroke Mother   . Hypertension Mother   . Cancer Maternal Grandmother   . Heart disease Father   . Cancer Daughter   . Breast cancer Neg Hx   . Diabetes Neg Hx   . Colon cancer Neg Hx   . Prostate cancer Neg Hx      SOCIAL HISTORY:  reports that he has never smoked. He  has never used smokeless tobacco. He reports that he does not drink alcohol or use drugs. The patient is married and accompanied by his wife. He states that he is originally from [place], and he and his wife moved down to [place] to care for her ailing daughter, who is recently passed away. The patient states that he and his wife would like to relocate back to [place] in the near future, there is a process of looking for a new home there, and are considering some of his therapy in [place]. 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 He quit drinking bourbon about 2 months ago but prior to quitting had been drinking 2 drinks a day for decades.   ALLERGIES: Aspirin   MEDICATIONS:  Current Outpatient Prescriptions  Medication Sig Dispense Refill  . allopurinol (ZYLOPRIM) 100 MG tablet TAKE 1 TABLET (100 MG TOTAL) BY MOUTH DAILY. 30 tablet 6  . atorvastatin (LIPITOR) 10 MG tablet TAKE 1 TABLET BY MOUTH AT BEDTIME 30 tablet 4  . b complex vitamins tablet Take 1 tablet by mouth daily.    . Calcium Carb-Cholecalciferol (CALCIUM 600 + D PO) Take 600 mg by mouth 2 (two) times daily. Pt takes 1/2 pill twice daily.    . cholecalciferol (VITAMIN D) 1000 units tablet Take 1,000 Units by mouth daily.    . colestipol (MICRONIZED COLESTIPOL HCL) 1 g tablet Take 1 tablet (1 g total) by mouth 2 (two) times daily. (Patient taking differently: Take 1 g by mouth as needed. ) 60 tablet 2  . levothyroxine (SYNTHROID, LEVOTHROID) 75 MCG tablet TAKE 1 TABLET BY MOUTH EVERY DAY 30 tablet 4  . metoprolol tartrate (LOPRESSOR) 25 MG tablet Take 12.5 mg by mouth 2 (two) times daily.    . Multiple Vitamins-Minerals (MULTIVITAMIN ADULT PO) Take 1 tablet by mouth 2 (two) times a week.    . nitroGLYCERIN (NITROSTAT) 0.4 MG SL tablet Place 0.4 mg under the tongue every 5 (five) minutes as needed. For chest pain    . ranitidine (ZANTAC) 150 MG capsule Take 1  capsule (150 mg total) by mouth every evening. For acid protection 30 capsule 2  . spironolactone (ALDACTONE) 50 MG tablet Take 1 tablet (50 mg total) by mouth daily. 90 tablet 1   No current facility-administered medications for this encounter.      REVIEW OF SYSTEMS: On review of systems, the patient reports that he is doing pretty well overall. He reports that he has stopped drinking in the last 2 months since finding out about his diagnosis of cirrhosis. He denies any chest pain, shortness of breath, cough, fevers, chills, night sweats. He denies any bladder disturbances, and denies abdominal pain, nausea or vomiting.he continues to have episodes intermittently of loose stools, but states that since being on collected, this has  improved his symptoms significantly. He is actually not having any trouble whatsoever with dysphagia or odynophagia. He is eating regular food, but loss of weight over the last year of approximately 30 pounds unintentional he has attributed this to his changes in diet in retrospect. He denies any discharge from the rectum, pain at the rectum, or concerns for another perirectal abscess. He denies any new musculoskeletal or joint aches or pains. A complete review of systems is obtained and is otherwise negative.     PHYSICAL EXAM:  Wt Readings from Last 3 Encounters:  10/05/16 150 lb (68 kg)  09/23/16 149 lb (67.6 kg)  09/21/16 149 lb 3.2 oz (67.7 kg)   Temp Readings from Last 3 Encounters:  10/05/16 (!) 97.5 F (36.4 C) (Oral)  09/23/16 97.8 F (36.6 C) (Temporal)  09/06/16 97.6 F (36.4 C) (Oral)   BP Readings from Last 3 Encounters:  10/05/16 (!) 137/45  09/23/16 (!) 117/40  09/21/16 (!) 104/50   Pulse Readings from Last 3 Encounters:  10/05/16 64  09/23/16 (!) 52  09/21/16 (!) 59   Pain Assessment Pain Score: 0-No pain/10  In general this is a well appearing elderly Caucasian male in no acute distress. He is alert and oriented x4 and appropriate  throughout the examination. HEENT reveals that the patient is normocephalic, atraumatic. EOMs are intact. PERRLA. Skin is intact without any evidence of gross lesions. Cardiovascular exam reveals a regular rate and rhythm, no clicks rubs or murmurs are auscultated. Chest is clear to auscultation bilaterally. Lymphatic assessment is performed and does not reveal any adenopathy in the cervical, supraclavicular, axillary, or inguinal chains. Abdomen has active bowel sounds in all quadrants and is intact. The abdomen is soft, non tender, non distended. Lower extremities are negative for pretibial pitting edema, deep calf tenderness, cyanosis or clubbing.   ECOG = 1  0 - Asymptomatic (Fully active, able to carry on all predisease activities without restriction)  1 - Symptomatic but completely ambulatory (Restricted in physically strenuous activity but ambulatory and able to carry out work of a light or sedentary nature. For example, light housework, office work)  2 - Symptomatic, <50% in bed during the day (Ambulatory and capable of all self care but unable to carry out any work activities. Up and about more than 50% of waking hours)  3 - Symptomatic, >50% in bed, but not bedbound (Capable of only limited self-care, confined to bed or chair 50% or more of waking hours)  4 - Bedbound (Completely disabled. Cannot carry on any self-care. Totally confined to bed or chair)  5 - Death   Eustace Pen MM, Creech RH, Tormey DC, et al. [phone]). "Toxicity and response criteria of the 2020 [facility]". [place] Oncol. 5 (6): 649-55    LABORATORY DATA:  Lab Results  Component Value Date   WBC 7.6 09/06/2016   HGB 14.6 09/06/2016   HCT 43.9 09/06/2016   MCV 98.2 09/06/2016   PLT 160.0 09/06/2016   Lab Results  Component Value Date   NA 138 09/06/2016   K 4.2 09/06/2016   CL 103 09/06/2016   CO2 28 09/06/2016   Lab Results  Component Value Date   ALT 28 09/06/2016   AST 32  09/06/2016   ALKPHOS 137 (H) 09/06/2016   BILITOT 1.1 09/06/2016      RADIOGRAPHY: Ct Chest W Contrast  Result Date: 10/01/2016 CLINICAL DATA:  Newly diagnosed esophageal adenocarcinoma.  Staging. EXAM: CT CHEST, ABDOMEN, AND PELVIS WITH CONTRAST  TECHNIQUE: Multidetector CT imaging of the chest, abdomen and pelvis was performed following the standard protocol during bolus administration of intravenous contrast. CONTRAST:  100 mL Isovue-300 COMPARISON:  Chest CT on 06/25/2016 and AP CT on 10/17/2010 FINDINGS: CT CHEST FINDINGS Cardiovascular: No acute findings. Aortic and coronary artery atherosclerosis. Mediastinum/Lymph Nodes: Focal area of concentric wall thickening and enhancement is seen involving the distal thoracic esophagus, consistent with known esophageal carcinoma. No pathologically enlarged lymph nodes identified. Lungs/Pleura: Moderate right pleural effusion is mildly decreased in size since previous study. No evidence of pulmonary infiltrate or mass. Musculoskeletal:  No suspicious bone lesions identified. CT ABDOMEN AND PELVIS FINDINGS Hepatobiliary: Hepatic cirrhosis again demonstrated. A 1.2 cm hypervascular mass is seen in segment 2 of the left lobe which measures 1.2 cm on image 50/2. This is slightly increased in size compared to 10 mm on previous study. A second 10 mm hypervascular mass is seen in the dome of the right hepatic lobe on image 46/2 which was not seen on previous study. These lesions are suspicious for hepatocellular carcinoma in the setting of hepatic cirrhosis. Gallbladder is unremarkable. Pancreas:  No mass or inflammatory changes. Spleen:  Mild splenomegaly, consistent portal venous hypertension. Adrenals/Urinary tract: Several renal cysts again seen bilaterally. No masses or hydronephrosis. Stomach/Bowel: Enhancing polypoid soft tissue density seen along the right lateral rectal wall measuring 2.5 x 3.1 cm, and highly suspicious for rectal carcinoma. No evidence of bowel  obstruction. Diverticulosis seen mainly involving the descending sigmoid colon, however there is no evidence of diverticulitis. Normal appendix visualized. Vascular/Lymphatic: No pathologically enlarged lymph nodes identified. No abdominal aortic aneurysm. Aortic atherosclerosis. Reproductive:  No mass or other significant abnormality identified. Other:  Stable small fat-containing right inguinal hernia. Musculoskeletal:  No suspicious bone lesions identified. IMPRESSION: Focal concentric wall thickening and enhancement of distal thoracic esophagus, consistent known primary esophageal carcinoma. No definite evidence of metastatic disease within the chest, abdomen, or pelvis. Moderate right pleural effusion, mildly decreased in size since prior study. 3 cm enhancing polypoid soft tissue density along the right lateral rectal wall, highly suspicious for primary rectal carcinoma. Recommend correlation with rectal exam and colonoscopy. Hepatic cirrhosis and findings of portal venous hypertension. Two small hypervascular masses in the right and left lobes, mildly increased and suspicious for multifocal hepatocellular carcinoma. Other incidental findings described above. Electronically Signed   By: Earle Gell M.D.   On: 10/01/2016 16:22   Ct Abdomen Pelvis W Contrast  Result Date: 10/01/2016 CLINICAL DATA:  Newly diagnosed esophageal adenocarcinoma.  Staging. EXAM: CT CHEST, ABDOMEN, AND PELVIS WITH CONTRAST TECHNIQUE: Multidetector CT imaging of the chest, abdomen and pelvis was performed following the standard protocol during bolus administration of intravenous contrast. CONTRAST:  100 mL Isovue-300 COMPARISON:  Chest CT on 06/25/2016 and AP CT on 10/17/2010 FINDINGS: CT CHEST FINDINGS Cardiovascular: No acute findings. Aortic and coronary artery atherosclerosis. Mediastinum/Lymph Nodes: Focal area of concentric wall thickening and enhancement is seen involving the distal thoracic esophagus, consistent with known  esophageal carcinoma. No pathologically enlarged lymph nodes identified. Lungs/Pleura: Moderate right pleural effusion is mildly decreased in size since previous study. No evidence of pulmonary infiltrate or mass. Musculoskeletal:  No suspicious bone lesions identified. CT ABDOMEN AND PELVIS FINDINGS Hepatobiliary: Hepatic cirrhosis again demonstrated. A 1.2 cm hypervascular mass is seen in segment 2 of the left lobe which measures 1.2 cm on image 50/2. This is slightly increased in size compared to 10 mm on previous study. A second 10 mm hypervascular mass is  seen in the dome of the right hepatic lobe on image 46/2 which was not seen on previous study. These lesions are suspicious for hepatocellular carcinoma in the setting of hepatic cirrhosis. Gallbladder is unremarkable. Pancreas:  No mass or inflammatory changes. Spleen:  Mild splenomegaly, consistent portal venous hypertension. Adrenals/Urinary tract: Several renal cysts again seen bilaterally. No masses or hydronephrosis. Stomach/Bowel: Enhancing polypoid soft tissue density seen along the right lateral rectal wall measuring 2.5 x 3.1 cm, and highly suspicious for rectal carcinoma. No evidence of bowel obstruction. Diverticulosis seen mainly involving the descending sigmoid colon, however there is no evidence of diverticulitis. Normal appendix visualized. Vascular/Lymphatic: No pathologically enlarged lymph nodes identified. No abdominal aortic aneurysm. Aortic atherosclerosis. Reproductive:  No mass or other significant abnormality identified. Other:  Stable small fat-containing right inguinal hernia. Musculoskeletal:  No suspicious bone lesions identified. IMPRESSION: Focal concentric wall thickening and enhancement of distal thoracic esophagus, consistent known primary esophageal carcinoma. No definite evidence of metastatic disease within the chest, abdomen, or pelvis. Moderate right pleural effusion, mildly decreased in size since prior study. 3 cm  enhancing polypoid soft tissue density along the right lateral rectal wall, highly suspicious for primary rectal carcinoma. Recommend correlation with rectal exam and colonoscopy. Hepatic cirrhosis and findings of portal venous hypertension. Two small hypervascular masses in the right and left lobes, mildly increased and suspicious for multifocal hepatocellular carcinoma. Other incidental findings described above. Electronically Signed   By: Earle Gell M.D.   On: 10/01/2016 16:22       IMPRESSION/PLAN: 1. Unstaged adenocarcinoma of the thoracic esophagus. Dr. Lisbeth Renshaw discusses the pathology findings and reviews the nature of esophageal disease. Dr. Lisbeth Renshaw recommends a PET scan to better understand the extent of disease, and following this, we would anticipate a course of radiation with concurrent chemotherapy. We discussed the risks, benefits, short, and long term effects of radiotherapy, and the patient is interested in proceeding. Dr. Lisbeth Renshaw discusses the delivery and logistics of radiotherapy. Written consent is obtained and placed in the chart, a copy was provided to the patient. He will return for simulation once we have his PET scan results.  2. Hepatic Cirrhosis. The patient will continue on his Spironolactone as outlined by Dr. Carlean Purl. We will follow up with the results of his PET scan to evaluate the questionable lesion in the liver, though Dr. Lisbeth Renshaw reviews that this is not the ideal test to assess for this. We will discuss further in GI conference but await the results of his PET scan.  3. Rectal lesion seen on CT imaging.the patient has a week to go to see Dr. Burr Medico, and did not have a chance to perform however after speaking with her following his visit, she did an exam did reveal  polypoid tissue at the site of the dentate line, and Dr. Carlean Purl has been asked to consider further endoscopy for tissue sampling of this rectal finding to rule out second primary.   The above documentation reflects my  direct findings during this shared patient visit. Please see the separate note by Dr. Lisbeth Renshaw on this date for the remainder of the patient's plan of care.    Carola Rhine, PAC

## 2016-10-06 ENCOUNTER — Telehealth: Payer: Self-pay

## 2016-10-06 ENCOUNTER — Ambulatory Visit (AMBULATORY_SURGERY_CENTER): Payer: Self-pay | Admitting: *Deleted

## 2016-10-06 ENCOUNTER — Encounter: Payer: Self-pay | Admitting: Hematology

## 2016-10-06 ENCOUNTER — Other Ambulatory Visit: Payer: Self-pay | Admitting: Radiation Oncology

## 2016-10-06 VITALS — Ht 68.0 in | Wt 148.0 lb

## 2016-10-06 DIAGNOSIS — K6289 Other specified diseases of anus and rectum: Secondary | ICD-10-CM

## 2016-10-06 DIAGNOSIS — K629 Disease of anus and rectum, unspecified: Secondary | ICD-10-CM

## 2016-10-06 NOTE — Telephone Encounter (Signed)
Left VM introducing myself as the GI Nurse Navigator working with Dr. Burr Medico. I explained my role and encouraged Fernando Liu to call if he has any concerns or questions regarding his treatment plan.

## 2016-10-06 NOTE — Telephone Encounter (Signed)
Discussed with patient the need for flex.  He agrees to proceed.  He will come in for a pre-visit today at 1:00 and flex tomorrow in the [name] at 2:00

## 2016-10-06 NOTE — Progress Notes (Signed)
Patient denies any allergies to eggs or soy. Patient denies any problems with anesthesia/sedation. Patient denies any oxygen use at home and does not take any diet/weight loss medications. EMMI education assisgned to patient on flex sig., this was explained and instructions given to patient. Patient denies being on Plavix at this time. He states he was told to stop this when he saw Dr.Gessner last. Per EGD report patient was to stop Plavix.

## 2016-10-06 NOTE — Telephone Encounter (Signed)
Patient wanted you that he is not on PLavix.  He says he was instructed to hold his Plavix by you previously.
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
## 2016-10-07 ENCOUNTER — Encounter: Payer: Self-pay | Admitting: Internal Medicine

## 2016-10-07 ENCOUNTER — Telehealth: Payer: Self-pay | Admitting: *Deleted

## 2016-10-07 ENCOUNTER — Telehealth: Payer: Self-pay | Admitting: Pharmacist

## 2016-10-07 ENCOUNTER — Ambulatory Visit (AMBULATORY_SURGERY_CENTER): Payer: Medicare Other | Admitting: Internal Medicine

## 2016-10-07 VITALS — BP 124/54 | HR 64 | Temp 97.5°F | Resp 17 | Ht 68.0 in | Wt 148.0 lb

## 2016-10-07 DIAGNOSIS — K629 Disease of anus and rectum, unspecified: Secondary | ICD-10-CM | POA: Diagnosis not present

## 2016-10-07 DIAGNOSIS — K6289 Other specified diseases of anus and rectum: Secondary | ICD-10-CM

## 2016-10-07 DIAGNOSIS — I2510 Atherosclerotic heart disease of native coronary artery without angina pectoris: Secondary | ICD-10-CM | POA: Diagnosis not present

## 2016-10-07 DIAGNOSIS — D128 Benign neoplasm of rectum: Secondary | ICD-10-CM | POA: Diagnosis not present

## 2016-10-07 DIAGNOSIS — C159 Malignant neoplasm of esophagus, unspecified: Secondary | ICD-10-CM

## 2016-10-07 DIAGNOSIS — I10 Essential (primary) hypertension: Secondary | ICD-10-CM | POA: Diagnosis not present

## 2016-10-07 MED ORDER — SODIUM CHLORIDE 0.9 % IV SOLN: 500.0000 mL | INTRAVENOUS | Status: DC

## 2016-10-07 MED ORDER — CAPECITABINE 500 MG PO TABS: 825.0000 mg/m2 | ORAL_TABLET | Freq: Two times a day (BID) | ORAL | 0 refills | Status: AC

## 2016-10-07 NOTE — Progress Notes (Signed)
Pt's states no medical or surgical changes since previsit or office visit. 

## 2016-10-07 NOTE — Patient Instructions (Addendum)
Large rectal polyp seen and biopsied. Does not look like cancer though possible.  Do not think this changes anything and does not require removal unless it causes problems.  I appreciate the opportunity to care for you. Gatha Mayer, MD, FACG YOU HAD AN ENDOSCOPIC PROCEDURE TODAY AT [facility]:   Refer to the procedure report that was given to you for any specific questions about what was found during the examination.  If the procedure report does not answer your questions, please call your gastroenterologist to clarify.  If you requested that your care partner not be given the details of your procedure findings, then the procedure report has been included in a sealed envelope for you to review at your convenience later.  YOU SHOULD EXPECT: Some feelings of bloating in the abdomen. Passage of more gas than usual.  Walking can help get rid of the air that was put into your GI tract during the procedure and reduce the bloating. If you had a lower endoscopy (such as a colonoscopy or flexible sigmoidoscopy) you may notice spotting of blood in your stool or on the toilet paper. If you underwent a bowel prep for your procedure, you may not have a normal bowel movement for a few days.  Please Note:  You might notice some irritation and congestion in your nose or some drainage.  This is from the oxygen used during your procedure.  There is no need for concern and it should clear up in a day or so.  SYMPTOMS TO REPORT IMMEDIATELY:   Following lower endoscopy (colonoscopy or flexible sigmoidoscopy):  Excessive amounts of blood in the stool  Significant tenderness or worsening of abdominal pains  Swelling of the abdomen that is new, acute  Fever of 100F or higher  For urgent or emergent issues, a gastroenterologist can be reached at any hour by calling [phone].   DIET:  We do recommend a small meal at first, but then you may proceed to your regular diet.  Drink  plenty of fluids but you should avoid alcoholic beverages for 24 hours.  ACTIVITY:  You should plan to take it easy for the rest of today and you should NOT DRIVE or use heavy machinery until tomorrow (because of the sedation medicines used during the test).    FOLLOW UP: Our staff will call the number listed on your records the next business day following your procedure to check on you and address any questions or concerns that you may have regarding the information given to you following your procedure. If we do not reach you, we will leave a message.  However, if you are feeling well and you are not experiencing any problems, there is no need to return our call.  We will assume that you have returned to your regular daily activities without incident.  If any biopsies were taken you will be contacted by phone or by letter within the next 1-3 weeks.  Please call us at [phone] if you have not heard about the biopsies in 3 weeks.    SIGNATURES/CONFIDENTIALITY: You and/or your care partner have signed paperwork which will be entered into your electronic medical record.  These signatures attest to the fact that that the information above on your After Visit Summary has been reviewed and is understood.  Full responsibility of the confidentiality of this discharge information lies with you and/or your care-partner.  Handouts given on polyps today :) Dr. Carlean Purl discontinued your Plavix.

## 2016-10-07 NOTE — Progress Notes (Signed)
Called to room to assist during endoscopic procedure.  Patient ID and intended procedure confirmed with present staff. Received instructions for my participation in the procedure from the performing physician.  

## 2016-10-07 NOTE — Telephone Encounter (Signed)
Oral Oncology Pharmacist Encounter  Received new prescription for Xeloda for the palliative treatment of esophageal cancer in conjunction with radiation, planned duration likely 5 1/2 - 6 weeks.  Labs from 10/05/16 assessed, OK for treatment.  Current medication list in Epic reviewed, no DDIs with Xeloda identified.  Prescription has been e-scribed to the [facility] for benefits analysis and approval. Copayment $~$300 for entire course for radiation, this is likely covered by patient's Medicare supplement for $0 out of pocket cost for patient.   I spoke with patient for overview of new oral chemotherapy medication: Xeloda.   Counseled patient on administration, dosing, side effects, safe handling, and monitoring.  Patient will take Xeloda 500mg  tablets, 3 tablets (1500mg ) by mouth in AM and 3 tabs (1500mg ) by mouth in PM, within 30 minutes of finishing meals, on days of radiation only. Xeloda and radiation start date: not yet known  Side effects include but not limited to: fatigue, decerased blood counts, GI upset, diarrhea, and hand-foot syndrome. Patient has loperamide at home and will call the office if diarrhea develops.    Reviewed with patient importance of keeping a medication schedule and plan for any missed doses.  Fernando Liu voiced understanding and appreciation.   All questions answered.  We will follow for patient's radiation start date and have Xeloda dispensed from the pharmacy prior to this date.  Noted patient for chemotherapy education class on 10/18/16.  Patient knows to call the office with questions or concerns. Oral Oncology Clinic will continue to follow.  Thank you,  Johny Drilling, PharmD, BCPS, BCOP 10/07/2016  4:46 PM Oral Oncology Clinic [phone]

## 2016-10-07 NOTE — Progress Notes (Signed)
Report given to PACU, vss 

## 2016-10-07 NOTE — Op Note (Signed)
[facility] Patient Name: Fernando Liu Procedure Date: 10/07/2016 1:57 PM MRN: [id number] Endoscopist: Gatha Mayer , MD Age: 83 Referring MD:  Date of Birth: 05/24/1926 Gender: Male Account #: [id number] Procedure:                Flexible Sigmoidoscopy Indications:              Rectal mass Medicines:                None Procedure:                Pre-Anesthesia Assessment:                           - Prior to the procedure, a History and Physical                            was performed, and patient medications and                            allergies were reviewed. The patient's tolerance of                            previous anesthesia was also reviewed. The risks                            and benefits of the procedure and the sedation                            options and risks were discussed with the patient.                            All questions were answered, and informed consent                            was obtained. Prior Anticoagulants: The patient has                            taken no previous anticoagulant or antiplatelet                            agents. ASA Grade Assessment: III - A patient with                            severe systemic disease. After reviewing the risks                            and benefits, the patient was deemed in                            satisfactory condition to undergo the procedure.                           After obtaining informed consent, the scope was  passed under direct vision. The Endoscope was                            introduced through the anus and advanced to the the                            sigmoid colon. The flexible sigmoidoscopy was                            accomplished without difficulty. The patient                            tolerated the procedure well. The quality of the                            bowel preparation was good. The flexible   sigmoidoscopy was accomplished without difficulty.                            The patient tolerated the procedure well. Scope In: Scope Out: Findings:                 The perianal exam findings include erythematous                            skin.                           The digital rectal exam revealed a 2 cm (diameter)                            rubbery-textured and smooth rectal mass palpated 2                            to 3 cm from the anal verge. The mass was                            non-circumferential and located predominantly at                            the right-posterior bowel wall.                           A 20 mm polyp was found in the rectum. The polyp                            was semi-sessile. Biopsies were taken with a cold                            forceps for histology. Verification of patient                            identification for the specimen was done. Estimated  blood loss was minimal.                           The exam was otherwise without abnormality. Complications:            No immediate complications. Estimated Blood Loss:     Estimated blood loss was minimal. Impression:               - Erythematous skin. found on perianal exam.                           - Rectal mass 2 to 3 cm from the anal verge.                           - One 20 mm polyp in the rectum. Biopsied.                           - The examination was otherwise normal. Recommendation:           - Patient has a contact number available for                            emergencies. The signs and symptoms of potential                            delayed complications were discussed with the                            patient. Return to normal activities tomorrow.                            Written discharge instructions were provided to the                            patient.                           - Resume previous diet.                           - Await  pathology results.                           Doubt this needs to be removed in the contect of                            decompensated cirrhosis and esophageal                            adenocarcinoma Gatha Mayer, MD 10/07/2016 2:12:27 PM This report has been signed electronically.

## 2016-10-07 NOTE — Telephone Encounter (Signed)
Called patient to inform of Pet Scan on 10-18-16 - arrival time - 11:30 am, pt. To be NPO- 6 hrs, prior to test @ [facility], spoke with patient and he is aware of this test

## 2016-10-08 ENCOUNTER — Telehealth: Payer: Self-pay | Admitting: *Deleted

## 2016-10-08 NOTE — Telephone Encounter (Signed)
  Follow up Call-  Call back number 10/07/2016 09/23/2016  Post procedure Call Back phone  # [phone]  Permission to leave phone message Yes Yes  Some recent data might be hidden     Patient questions:  Do you have a fever, pain , or abdominal swelling? No. Pain Score  0 *  Have you tolerated food without any problems? Yes.    Have you been able to return to your normal activities? Yes.    Do you have any questions about your discharge instructions: Diet   No. Medications  No. Follow up visit  No.  Do you have questions or concerns about your Care? No.  Actions: * If pain score is 4 or above: No action needed, pain <4.

## 2016-10-12 ENCOUNTER — Encounter: Payer: Self-pay | Admitting: Internal Medicine

## 2016-10-12 NOTE — Progress Notes (Signed)
Benign rectal polyp  No recall needed  My Chart letter to patient

## 2016-10-15 ENCOUNTER — Ambulatory Visit
Admission: RE | Admit: 2016-10-15 | Discharge: 2016-10-15 | Disposition: A | Discharge status: Home / Self Care | Payer: Medicare Other | Source: Ambulatory Visit | Attending: Radiation Oncology | Admitting: Radiation Oncology

## 2016-10-15 DIAGNOSIS — C155 Malignant neoplasm of lower third of esophagus: Secondary | ICD-10-CM

## 2016-10-15 DIAGNOSIS — K7460 Unspecified cirrhosis of liver: Secondary | ICD-10-CM | POA: Diagnosis not present

## 2016-10-15 DIAGNOSIS — I2510 Atherosclerotic heart disease of native coronary artery without angina pectoris: Secondary | ICD-10-CM | POA: Diagnosis not present

## 2016-10-15 DIAGNOSIS — C153 Malignant neoplasm of upper third of esophagus: Secondary | ICD-10-CM | POA: Diagnosis not present

## 2016-10-15 DIAGNOSIS — K6289 Other specified diseases of anus and rectum: Secondary | ICD-10-CM | POA: Diagnosis not present

## 2016-10-15 DIAGNOSIS — K769 Liver disease, unspecified: Secondary | ICD-10-CM | POA: Diagnosis not present

## 2016-10-15 DIAGNOSIS — Z510 Encounter for antineoplastic radiation therapy: Secondary | ICD-10-CM | POA: Diagnosis not present

## 2016-10-18 ENCOUNTER — Encounter (HOSPITAL_COMMUNITY)
Admission: RE | Admit: 2016-10-18 | Discharge: 2016-10-18 | Disposition: A | Discharge status: Home / Self Care | Payer: Medicare Other | Source: Ambulatory Visit | Attending: Radiation Oncology | Admitting: Radiation Oncology

## 2016-10-18 ENCOUNTER — Telehealth: Payer: Self-pay | Admitting: Hematology

## 2016-10-18 ENCOUNTER — Other Ambulatory Visit: Payer: Medicare Other

## 2016-10-18 ENCOUNTER — Encounter: Payer: Self-pay | Admitting: Hematology

## 2016-10-18 ENCOUNTER — Ambulatory Visit (HOSPITAL_BASED_OUTPATIENT_CLINIC_OR_DEPARTMENT_OTHER): Payer: Medicare Other | Admitting: Hematology

## 2016-10-18 VITALS — BP 137/65 | HR 70 | Temp 97.7°F | Resp 17 | Ht 68.0 in | Wt 150.0 lb

## 2016-10-18 DIAGNOSIS — I10 Essential (primary) hypertension: Secondary | ICD-10-CM | POA: Diagnosis not present

## 2016-10-18 DIAGNOSIS — K769 Liver disease, unspecified: Secondary | ICD-10-CM | POA: Diagnosis not present

## 2016-10-18 DIAGNOSIS — C154 Malignant neoplasm of middle third of esophagus: Secondary | ICD-10-CM | POA: Diagnosis not present

## 2016-10-18 DIAGNOSIS — C153 Malignant neoplasm of upper third of esophagus: Secondary | ICD-10-CM | POA: Diagnosis not present

## 2016-10-18 DIAGNOSIS — C159 Malignant neoplasm of esophagus, unspecified: Secondary | ICD-10-CM

## 2016-10-18 LAB — GLUCOSE, CAPILLARY: Glucose-Capillary: 93 mg/dL (ref 65–99)

## 2016-10-18 MED FILL — XELODA 500 MG TABLET: 500 | 30 days supply | Qty: 132 | Fill #0

## 2016-10-18 NOTE — Telephone Encounter (Signed)
Gave patient AVS and calendar of upcoming September appointments.  °

## 2016-10-18 NOTE — Progress Notes (Signed)
[place]  Telephone:([phone]) [phone] [phone]  Clinic Follow Up Note   Patient Care Team: Mosie Lukes, MD as PCP - General (Family Medicine) Becky Sax, MD as Referring Physician (Orthopedic Surgery) Noralyn Pick, MD as Consulting Physician (Ophthalmology) 10/18/2016   CHIEF COMPLAINTS:   follow up adenocarcinoma of the esophagus  Oncology History   Cancer Staging Adenocarcinoma of esophagus [facility]) Staging form: Esophagus - Adenocarcinoma, AJCC 8th Edition - Clinical stage from 09/23/2016: Stage Unknown (cTX, cN0, cM0) - Signed by Truitt Merle, MD on 10/04/2016       Adenocarcinoma of esophagus ([place])   09/23/2016 Procedure    Upper Endoscopy by Dr. Carlean Purl 09/23/16 IMPRESSION - Malignant esophageal tumor was found in the middle third of the esophagus and in the lower third of the esophagus. - Z-line regular, 45 cm from the incisors. - Congestion and mosaic pattern mucosa in the cardia, gastric fundus and gastric body. - The examination was otherwise normal. - No specimens collected.      09/23/2016 Initial Biopsy    Diagnosis  09/23/16 Esophagus, biopsy, mid to distal esophageal mass - FOCAL INVASIVE ADENOCARCINOMA ASSOCIATED WITH EXTENSIVE HIGH GRADE GLANDULAR DYSPLASIA. - INTESTINAL METAPLASIA CONSISTENT WITH BACKGROUND BARRETT'S ESOPHAGUS. - SEE MICROSCOPIC DESCRIPTION      09/23/2016 Initial Diagnosis    Adenocarcinoma of esophagus ([place])      10/01/2016 Imaging    CT CAP w contrast: Focal concentric wall thickening and enhancement of distal thoracic esophagus, consistent known primary esophageal carcinoma. No definite evidence of metastatic disease within the chest, abdomen, or pelvis.  3 cm enhancing polypoid soft tissue density along the right lateral rectal wall, highly suspicious for primary rectal carcinoma. Recommend correlation with rectal exam and colonoscopy.         HISTORY OF PRESENTING ILLNESS: Fernando Liu 83 y.o. male with past medical history of liver cirrhosis, hypertension, CAD, who was referred by his gastroenterologist Dr. Carlean Purl for his recently diagnosed esophageal cancer. He presents to my clinic with his wife.   He was found to have liver cirrhosis about 2 months ago, when he was found to have right pleural effusion by his primary care physician. Further CT scan reviewed liver cirrhosis, with two indeterminate liver lesions, also liver MRI felt that this lesions are likely benign. Due to his newly diagnosed liver cirrhosis, Dr. Carlean Purl recommended endoscopy, he underwent EGD on 09/23/2016, which showed a also raises mass in the middle and lower esophagus, biopsy showed adenocarcinoma.  Patient denies dysphagia, or odynophagia. He reports mild hoarseness since 2 years ago. He is a retired Chief Financial Officer, still works Retail buyer, lives at home with his wife. He does have moderate fatigue, but is able to do ADLs, and light activities, but not very physically active. His appetite has been low for the past 2 years, and he has lost a total of 30 pounds over the past 2 years.  He had rectal absceess 20 years ago, which resulted sphincter dysfunction, he has had chronic diarrhea for many years. He started taking oral iron a few months ago, which improved his constipation. Although he is off iron now due to his gastric discomfort, his bowel movements and seems to be normal lately. He denies melena, or blood in stool.    He does drink alcohol moderately, never smoked. He is from [place], patient and his wife has been planning to move back to [place] in the near future.  CURRENT THERAPY: pending concurrent chemoradiation with Xeloda, starting on  10/26/2016  INTERIM HISTORY:  Fernando Liu presents today for follow-up. Pt was initially scheduled to have his PET scan performed today; however, he states that he ate breakfast unknowingly prior to the scan and they were unable to perform this. Since  we last saw him he has been doing well and he has not noticed any new symptoms. Pt reports that one month ago he has noticed a squeaky voice change, but he denies any dysphagia or odynophagia. He denies diarrhea, constipation, or change of bowel habits. Also, he had a sigmoidoscopy on 9/13 with one polyp observed which was non-cancerous.   MEDICAL HISTORY:  Past Medical History:  Diagnosis Date  . Adenocarcinoma of esophagus ([place]) 09/24/2016  . Anemia 12/28/2015  . CAD (coronary artery disease)   . Chicken pox 83 yrs old  . Cirrhosis of liver ([place]) 06/29/2016  . Decreased breath sounds 05/27/2016  . Gout 04/24/2015  . High cholesterol   . Hypertension   . Measles as a child  . Medicare annual wellness visit, subsequent 10/31/2012   Living will wife and daughter have a copy, does not want heroic measures Sees cardiology his recent cardiologist is retiring his new doctor will be at [facility]   . Neuropathy   . Osteoporosis 12/01/2015  . Pleural effusion on right 08/01/2016  . Preventative health care 10/31/2012  . Thyroid disease     SURGICAL HISTORY: Past Surgical History:  Procedure Laterality Date  . CATARACT EXTRACTION, BILATERAL     August & November 2012  . ESOPHAGOGASTRODUODENOSCOPY  08/2016   Esophageal cancer  . FOOT SURGERY Left   . HERNIA REPAIR    . PELVIC ABCESS DRAINAGE     states surgery affected control of BM  . TONSILLECTOMY      SOCIAL HISTORY: Social History   Social History  . Marital status: Married    Spouse name: N/A  . Number of children: N/A  . Years of education: N/A   Occupational History  . Semi-retired Chief Financial Officer    Social History Main Topics  . Smoking status: Never Smoker  . Smokeless tobacco: Never Used  . Alcohol use No     Comment: Used to have 2 shots of bourbon daily for many years but stopped summer 2018  . Drug use: No  . Sexual activity: Not on file   Other Topics Concern  . Not on file   Social History Narrative   Married second time  he has 3 daughters. He is a were semiretired Civil engineer, contracting.    FAMILY HISTORY: Family History  Problem Relation Age of Onset  . Stroke Mother   . Hypertension Mother   . Cancer Maternal Grandmother        gastric cancer   . Heart disease Father   . Cancer Daughter 41       lymphoma   . Cancer Cousin        leukemia   . Breast cancer Neg Hx   . Diabetes Neg Hx   . Colon cancer Neg Hx   . Prostate cancer Neg Hx     ALLERGIES:  is allergic to aspirin.  MEDICATIONS:  Current Outpatient Prescriptions  Medication Sig Dispense Refill  . allopurinol (ZYLOPRIM) 100 MG tablet TAKE 1 TABLET (100 MG TOTAL) BY MOUTH DAILY. 30 tablet 6  . atorvastatin (LIPITOR) 10 MG tablet TAKE 1 TABLET BY MOUTH AT BEDTIME 30 tablet 4  . b complex vitamins tablet Take 1 tablet by mouth daily.    Marland Kitchen  Calcium Carb-Cholecalciferol (CALCIUM 600 + D PO) Take 600 mg by mouth 2 (two) times daily. Pt takes 1/2 pill twice daily.    . capecitabine (XELODA) 500 MG tablet Take 3 tablets (1,500 mg total) by mouth 2 (two) times daily after a meal. On the day of radiation only, Monday through Friday 168 tablet 0  . cholecalciferol (VITAMIN D) 1000 units tablet Take 1,000 Units by mouth daily.    . colestipol (MICRONIZED COLESTIPOL HCL) 1 g tablet Take 1 tablet (1 g total) by mouth 2 (two) times daily. 60 tablet 2  . levothyroxine (SYNTHROID, LEVOTHROID) 75 MCG tablet TAKE 1 TABLET BY MOUTH EVERY DAY 30 tablet 4  . metoprolol tartrate (LOPRESSOR) 25 MG tablet Take 12.5 mg by mouth daily.     . Multiple Vitamins-Minerals (MULTIVITAMIN ADULT PO) Take 1 tablet by mouth 2 (two) times a week.    . ranitidine (ZANTAC) 150 MG capsule Take 1 capsule (150 mg total) by mouth every evening. For acid protection 30 capsule 2  . spironolactone (ALDACTONE) 50 MG tablet Take 1 tablet (50 mg total) by mouth daily. 90 tablet 1  . nitroGLYCERIN (NITROSTAT) 0.4 MG SL tablet Place 0.4 mg under the tongue every 5 (five) minutes as needed. For  chest pain     No current facility-administered medications for this visit.     REVIEW OF SYSTEMS:   Constitutional: Denies fevers, chills or abnormal night sweats Eyes: Denies blurriness of vision, double vision or watery eyes Ears, nose, mouth, throat, and face: Denies mucositis or sore throat Respiratory: Denies cough, dyspnea or wheezes Cardiovascular: Denies palpitation, chest discomfort or lower extremity swelling Gastrointestinal:  Denies nausea, heartburn or change in bowel habits Skin: Denies abnormal skin rashes Lymphatics: Denies new lymphadenopathy or easy bruising Neurological:Denies numbness, tingling or new weaknesses Behavioral/Psych: Mood is stable, no new changes  All other systems were reviewed with the patient and are negative.  PHYSICAL EXAMINATION:  ECOG PERFORMANCE STATUS: 1 - Symptomatic but completely ambulatory  Vitals:   10/18/16 1234  BP: 137/65  Pulse: 70  Resp: 17  Temp: 97.7 F (36.5 C)  SpO2: 100%   Filed Weights   10/18/16 1234  Weight: 150 lb (68 kg)   GENERAL:alert, no distress and comfortable SKIN: skin color, texture, turgor are normal, no rashes or significant lesions EYES: normal, conjunctiva are pink and non-injected, sclera clear OROPHARYNX:no exudate, no erythema and lips, buccal mucosa, and tongue normal  NECK: supple, thyroid normal size, non-tender, without nodularity LYMPH:  no palpable lymphadenopathy in the cervical, axillary or inguinal LUNGS: clear to auscultation and percussion with normal breathing effort HEART: regular rate & rhythm and no murmurs and no lower extremity edema ABDOMEN:abdomen soft, non-tender and normal bowel sounds Musculoskeletal:no cyanosis of digits and no clubbing  PSYCH: alert & oriented x 3 with fluent speech NEURO: no focal motor/sensory deficits  LABORATORY DATA:  I have reviewed the data as listed CBC Latest Ref Rng & Units 10/05/2016 09/06/2016 08/09/2016  WBC 4.0 - 10.3 10e3/uL 7.9 7.6 7.2    Hemoglobin 13.0 - 17.1 g/dL 15.5 14.6 14.6  Hematocrit 38.4 - 49.9 % 45.4 43.9 43.5  Platelets 140 - 400 10e3/uL 144 160.0 176.0   CMP Latest Ref Rng & Units 10/05/2016 09/06/2016 08/09/2016  Glucose 70 - 140 mg/dl 102 101(H) 97  BUN 7.0 - 26.0 mg/dL 38.6(H) 36(H) 18  Creatinine 0.7 - 1.3 mg/dL 1.2 1.24 1.26  Sodium 136 - 145 mEq/L 139 138 139  Potassium 3.5 -  5.1 mEq/L 4.9 4.2 4.3  Chloride 96 - 112 mEq/L - 103 106  CO2 22 - 29 mEq/L 25 28 28   Calcium 8.4 - 10.4 mg/dL 10.1 9.5 9.1  Total Protein 6.4 - 8.3 g/dL 7.4 6.2 6.2  Total Bilirubin 0.20 - 1.20 mg/dL 0.81 1.1 0.9  Alkaline Phos 40 - 150 U/L 179(H) 137(H) 160(H)  AST 5 - 34 U/L 44(H) 32 34  ALT 0 - 55 U/L 49 28 26   CEA 10/05/16 - 2.41   PATHOLOGY  Diagnosis 10/07/16 Surgical [P], rectal, polyp - TUBULOVILLOUS ADENOMA (SIX FRAGMENTS). - NO HIGH GRADE DYSPLASIA OR MALIGNANCY.  Diagnosis  09/23/16 Esophagus, biopsy, mid to distal esophageal mass - FOCAL INVASIVE ADENOCARCINOMA ASSOCIATED WITH EXTENSIVE HIGH GRADE GLANDULAR DYSPLASIA. - INTESTINAL METAPLASIA CONSISTENT WITH BACKGROUND BARRETT'S ESOPHAGUS. - SEE MICROSCOPIC DESCRIPTION Microscopic Comment There are fragments of squamous and glandular mucosa and the glandular mucosa shows extensive high grade glandular dysplasia associated with Barrett's-type metaplasia. One of the fragments also has focal invasive adenocarcinoma within the mucosal stroma. Called to Dr Celesta Aver office on 09/24/16. (JDP:kh 09/24/16) Claudette Laws MD   PROCEDURES  Flexible Sigmoidoscopy 10/07/16 IMPRESSION - The perianal exam findings include erythematous skin. - The digital rectal exam revealed a 2 cm (diameter) rubbery-textured and smooth rectal mass palpated 2 to 3 cm from the anal verge. The mass was non-circumferential and located predominantly at the right-posterior bowel wall. - A 20 mm polyp was found in the rectum. The polyp was semi-sessile. Biopsies were taken with a cold  forceps for histology. Verification of patient identification for the specimen was done. Estimated blood loss was minimal. - The exam was otherwise without abnormality.  Upper Endoscopy by Dr. Carlean Purl 09/23/16 IMPRESSION - Malignant esophageal tumor was found in the middle third of the esophagus and in the lower third of the esophagus. - Z-line regular, 45 cm from the incisors. - Congestion and mosaic pattern mucosa in the cardia, gastric fundus and gastric body. - The examination was otherwise normal. - No specimens collected.   RADIOGRAPHIC STUDIES: I have personally reviewed the radiological images as listed and agreed with the findings in the report. Ct Chest W Contrast  Result Date: 10/01/2016 CLINICAL DATA:  Newly diagnosed esophageal adenocarcinoma.  Staging. EXAM: CT CHEST, ABDOMEN, AND PELVIS WITH CONTRAST TECHNIQUE: Multidetector CT imaging of the chest, abdomen and pelvis was performed following the standard protocol during bolus administration of intravenous contrast. CONTRAST:  100 mL Isovue-300 COMPARISON:  Chest CT on 06/25/2016 and AP CT on 10/17/2010 FINDINGS: CT CHEST FINDINGS Cardiovascular: No acute findings. Aortic and coronary artery atherosclerosis. Mediastinum/Lymph Nodes: Focal area of concentric wall thickening and enhancement is seen involving the distal thoracic esophagus, consistent with known esophageal carcinoma. No pathologically enlarged lymph nodes identified. Lungs/Pleura: Moderate right pleural effusion is mildly decreased in size since previous study. No evidence of pulmonary infiltrate or mass. Musculoskeletal:  No suspicious bone lesions identified. CT ABDOMEN AND PELVIS FINDINGS Hepatobiliary: Hepatic cirrhosis again demonstrated. A 1.2 cm hypervascular mass is seen in segment 2 of the left lobe which measures 1.2 cm on image 50/2. This is slightly increased in size compared to 10 mm on previous study. A second 10 mm hypervascular mass is seen in the dome of  the right hepatic lobe on image 46/2 which was not seen on previous study. These lesions are suspicious for hepatocellular carcinoma in the setting of hepatic cirrhosis. Gallbladder is unremarkable. Pancreas:  No mass or inflammatory changes. Spleen:  Mild splenomegaly, consistent  portal venous hypertension. Adrenals/Urinary tract: Several renal cysts again seen bilaterally. No masses or hydronephrosis. Stomach/Bowel: Enhancing polypoid soft tissue density seen along the right lateral rectal wall measuring 2.5 x 3.1 cm, and highly suspicious for rectal carcinoma. No evidence of bowel obstruction. Diverticulosis seen mainly involving the descending sigmoid colon, however there is no evidence of diverticulitis. Normal appendix visualized. Vascular/Lymphatic: No pathologically enlarged lymph nodes identified. No abdominal aortic aneurysm. Aortic atherosclerosis. Reproductive:  No mass or other significant abnormality identified. Other:  Stable small fat-containing right inguinal hernia. Musculoskeletal:  No suspicious bone lesions identified. IMPRESSION: Focal concentric wall thickening and enhancement of distal thoracic esophagus, consistent known primary esophageal carcinoma. No definite evidence of metastatic disease within the chest, abdomen, or pelvis. Moderate right pleural effusion, mildly decreased in size since prior study. 3 cm enhancing polypoid soft tissue density along the right lateral rectal wall, highly suspicious for primary rectal carcinoma. Recommend correlation with rectal exam and colonoscopy. Hepatic cirrhosis and findings of portal venous hypertension. Two small hypervascular masses in the right and left lobes, mildly increased and suspicious for multifocal hepatocellular carcinoma. Other incidental findings described above. Electronically Signed   By: Earle Gell M.D.   On: 10/01/2016 16:22   Ct Abdomen Pelvis W Contrast  Result Date: 10/01/2016 CLINICAL DATA:  Newly diagnosed esophageal  adenocarcinoma.  Staging. EXAM: CT CHEST, ABDOMEN, AND PELVIS WITH CONTRAST TECHNIQUE: Multidetector CT imaging of the chest, abdomen and pelvis was performed following the standard protocol during bolus administration of intravenous contrast. CONTRAST:  100 mL Isovue-300 COMPARISON:  Chest CT on 06/25/2016 and AP CT on 10/17/2010 FINDINGS: CT CHEST FINDINGS Cardiovascular: No acute findings. Aortic and coronary artery atherosclerosis. Mediastinum/Lymph Nodes: Focal area of concentric wall thickening and enhancement is seen involving the distal thoracic esophagus, consistent with known esophageal carcinoma. No pathologically enlarged lymph nodes identified. Lungs/Pleura: Moderate right pleural effusion is mildly decreased in size since previous study. No evidence of pulmonary infiltrate or mass. Musculoskeletal:  No suspicious bone lesions identified. CT ABDOMEN AND PELVIS FINDINGS Hepatobiliary: Hepatic cirrhosis again demonstrated. A 1.2 cm hypervascular mass is seen in segment 2 of the left lobe which measures 1.2 cm on image 50/2. This is slightly increased in size compared to 10 mm on previous study. A second 10 mm hypervascular mass is seen in the dome of the right hepatic lobe on image 46/2 which was not seen on previous study. These lesions are suspicious for hepatocellular carcinoma in the setting of hepatic cirrhosis. Gallbladder is unremarkable. Pancreas:  No mass or inflammatory changes. Spleen:  Mild splenomegaly, consistent portal venous hypertension. Adrenals/Urinary tract: Several renal cysts again seen bilaterally. No masses or hydronephrosis. Stomach/Bowel: Enhancing polypoid soft tissue density seen along the right lateral rectal wall measuring 2.5 x 3.1 cm, and highly suspicious for rectal carcinoma. No evidence of bowel obstruction. Diverticulosis seen mainly involving the descending sigmoid colon, however there is no evidence of diverticulitis. Normal appendix visualized. Vascular/Lymphatic: No  pathologically enlarged lymph nodes identified. No abdominal aortic aneurysm. Aortic atherosclerosis. Reproductive:  No mass or other significant abnormality identified. Other:  Stable small fat-containing right inguinal hernia. Musculoskeletal:  No suspicious bone lesions identified. IMPRESSION: Focal concentric wall thickening and enhancement of distal thoracic esophagus, consistent known primary esophageal carcinoma. No definite evidence of metastatic disease within the chest, abdomen, or pelvis. Moderate right pleural effusion, mildly decreased in size since prior study. 3 cm enhancing polypoid soft tissue density along the right lateral rectal wall, highly suspicious for primary rectal carcinoma.  Recommend correlation with rectal exam and colonoscopy. Hepatic cirrhosis and findings of portal venous hypertension. Two small hypervascular masses in the right and left lobes, mildly increased and suspicious for multifocal hepatocellular carcinoma. Other incidental findings described above. Electronically Signed   By: Earle Gell M.D.   On: 10/01/2016 16:22     CT CAP W Contrast 10/01/16  IMPRESSION:  Focal concentric wall thickening and enhancement of distal thoracic esophagus, consistent known primary esophageal carcinoma. No definite evidence of metastatic disease within the chest, abdomen, or pelvis.  Moderate right pleural effusion, mildly decreased in size since prior study.  3 cm enhancing polypoid soft tissue density along the right lateral rectal wall, highly suspicious for primary rectal carcinoma. Recommend correlation with rectal exam and colonoscopy.  Hepatic cirrhosis and findings of portal venous hypertension. Two small hypervascular masses in the right and left lobes, mildly increased and suspicious for multifocal hepatocellular carcinoma.  MR liver w wo contrast 07/31/2016 IMPRESSION: 1. Cirrhosis and portal venous hypertension with splenomegaly. 2. Arterial and portal venous  phase focus of hyper enhancement in the high left hepatic lobe is likely a perfusion anomaly. Consider follow-up with pre and post contrast abdominal MRI at 3-6 months. 3. Otherwise, no liver lesion identified. Outside ultrasound not submitted. 4.  Aortic Atherosclerosis (ICD10-I70.0). 5. Moderate right pleural effusion with small volume perihepatic ascites.  ASSESSMENT & PLAN:  Fernando Liu is a 83 y.o. male with a history of Anemia,CAD, Cirrhosis of Liver, HTN, Neuropathy, Osteoporosis, Pleural effusion and thyroid disease.  1. Adenocarcinoma of the esophagus, min-low esophagus, [id number] -I discussed the pathology results with the patient in detail -His CT scan reviewed no evidence of distant metastasis, this is reviewed with patient. -For more detailed staging, Dr. Lisbeth Renshaw has ordered a PET scan. He missed his original scan, we have reschedule this to be TOMORROW to maintain his treatment schedule.  -I discussed that while surgery is the primary treatment for esophageal cancer, he is not an optimal candidate due to his advanced age and comorbidities and liver cirrhosis, I explained the surgery would be extensive due to having to remove a large portion of his esophagus, and possible part of his stomach to obtain clear margins.  -I encouraged him to consider radiation, which is likely to be palliative to prevent and delay his dysphagia, and bleeding from the esophageal cancer. -I discussed the benefit of chemotherapy with concurrent radiation. I discussed this is a small possibility he may have a complete response to chemoradiation, but would likely require more intensive chemotherapy, such as FOLFOX. Due to his advanced age and comorbidities, especially his liver cirrhosis, I do not think he is a good candidate for intensive intravenous chemotherapy, he may not be able tolerate weekly carbo and Taxol well. I discussed the alternative chemotherapy option, such as Xeloda, to increase the efficacy of  radiation. Given the palliative nature of his treatment, I think Xeloda is a better option.  -Due to his age and comorbidities, I recommend Xeloda concurrent with radiation, hold on weekends.  -I discussed potential side effects of xeloda including but not limited to fatigue, nausea, diarrhea, inflammation/pain in the abdomen, low blood counts, treatment interruptions, skin toxicity, and mouth sores.  He agrees to proceed -I discussed in the presence of liver cirrhosis his side effects may be increased, we will monitor closely. He agrees to proceed. -He will pick up as Xeloda from [place] today -We'll monitor his lab weekly during his treatment, and I will see him every 1-2  weeks.   2.  Rectal polyp  -rectal exam negative for masses; no blood -- He underwent a flexible sigmoidoscopy which showed a benign rectal polyp.   3.  Cryptogenic liver cirrhosis with ascites -followed by GI, Dr. Carlean Purl -He does have 2 indeterminate liver lesions, likely benign based on the MRI, he will be followed by Dr. Carlean Purl.   4. CAD, HTN -followed by cardiology, Dr. Donnetta Hutching at [facility] -on metoprolol, aldactone, lipitor; nitro PRN -BP 136/61 today, stable; will monitor  PLAN:  -He will start concurrent chemoradiation with Xeloda  On 10/2. He is scheduled to see Dr. Lisbeth Renshaw on October 2 -Lab weekly x6, start on 10/2.  -F/u on 10/8, 10/23, and 10/30.  -Please call to reschedule his PET (cancelled today) to tomorrow, approved by NM.   No orders of the defined types were placed in this encounter.   All questions were answered. The patient knows to call the clinic with any problems, questions or concerns. I spent 20 minutes counseling the patient face to face. The total time spent in the appointment was 25 minutes and more than 50% was on counseling.  This document serves as a record of services personally performed by Truitt Merle, MD. It was created on her behalf by Reola Mosher, a trained medical  scribe. The creation of this record is based on the scribe's personal observations and the provider's statements to them. This document has been checked and approved by the attending provider.    Truitt Merle, MD 10/18/2016

## 2016-10-19 ENCOUNTER — Ambulatory Visit (HOSPITAL_COMMUNITY)
Admission: RE | Admit: 2016-10-19 | Discharge: 2016-10-19 | Disposition: A | Discharge status: Home / Self Care | Payer: Medicare Other | Source: Ambulatory Visit | Attending: Radiation Oncology | Admitting: Radiation Oncology

## 2016-10-19 ENCOUNTER — Encounter: Payer: Self-pay | Admitting: Hematology

## 2016-10-19 DIAGNOSIS — J90 Pleural effusion, not elsewhere classified: Secondary | ICD-10-CM | POA: Diagnosis not present

## 2016-10-19 DIAGNOSIS — R911 Solitary pulmonary nodule: Secondary | ICD-10-CM | POA: Insufficient documentation

## 2016-10-19 DIAGNOSIS — I714 Abdominal aortic aneurysm, without rupture: Secondary | ICD-10-CM | POA: Diagnosis not present

## 2016-10-19 DIAGNOSIS — C159 Malignant neoplasm of esophagus, unspecified: Secondary | ICD-10-CM | POA: Diagnosis not present

## 2016-10-19 DIAGNOSIS — C153 Malignant neoplasm of upper third of esophagus: Secondary | ICD-10-CM | POA: Diagnosis not present

## 2016-10-19 DIAGNOSIS — K7460 Unspecified cirrhosis of liver: Secondary | ICD-10-CM | POA: Diagnosis not present

## 2016-10-19 DIAGNOSIS — I700 Atherosclerosis of aorta: Secondary | ICD-10-CM | POA: Diagnosis not present

## 2016-10-19 DIAGNOSIS — N62 Hypertrophy of breast: Secondary | ICD-10-CM | POA: Insufficient documentation

## 2016-10-19 DIAGNOSIS — R160 Hepatomegaly, not elsewhere classified: Secondary | ICD-10-CM | POA: Diagnosis not present

## 2016-10-19 DIAGNOSIS — K5730 Diverticulosis of large intestine without perforation or abscess without bleeding: Secondary | ICD-10-CM | POA: Insufficient documentation

## 2016-10-19 DIAGNOSIS — N281 Cyst of kidney, acquired: Secondary | ICD-10-CM | POA: Diagnosis not present

## 2016-10-19 DIAGNOSIS — I2510 Atherosclerotic heart disease of native coronary artery without angina pectoris: Secondary | ICD-10-CM | POA: Diagnosis not present

## 2016-10-19 DIAGNOSIS — K4090 Unilateral inguinal hernia, without obstruction or gangrene, not specified as recurrent: Secondary | ICD-10-CM | POA: Insufficient documentation

## 2016-10-19 LAB — GLUCOSE, CAPILLARY: Glucose-Capillary: 89 mg/dL (ref 65–99)

## 2016-10-19 MED ORDER — FLUDEOXYGLUCOSE F - 18 (FDG) INJECTION
7.9000 | Freq: Once | INTRAVENOUS | Status: AC | PRN
  Administered 2016-10-19: 7.9 via INTRAVENOUS

## 2016-10-19 NOTE — Progress Notes (Signed)
Pt has 2 insurances so copay assistance should be needed but he will be coming from [place] for radiation next week so I [employer] requesting she reach out to the pt to see if he can use the [employer] for gasoline.

## 2016-10-21 DIAGNOSIS — Z510 Encounter for antineoplastic radiation therapy: Secondary | ICD-10-CM | POA: Diagnosis not present

## 2016-10-21 DIAGNOSIS — K6289 Other specified diseases of anus and rectum: Secondary | ICD-10-CM | POA: Diagnosis not present

## 2016-10-21 DIAGNOSIS — I2510 Atherosclerotic heart disease of native coronary artery without angina pectoris: Secondary | ICD-10-CM | POA: Diagnosis not present

## 2016-10-21 DIAGNOSIS — C153 Malignant neoplasm of upper third of esophagus: Secondary | ICD-10-CM | POA: Diagnosis not present

## 2016-10-21 DIAGNOSIS — C155 Malignant neoplasm of lower third of esophagus: Secondary | ICD-10-CM | POA: Diagnosis not present

## 2016-10-21 DIAGNOSIS — K769 Liver disease, unspecified: Secondary | ICD-10-CM | POA: Diagnosis not present

## 2016-10-21 DIAGNOSIS — K7460 Unspecified cirrhosis of liver: Secondary | ICD-10-CM | POA: Diagnosis not present

## 2016-10-22 ENCOUNTER — Ambulatory Visit: Payer: Medicare Other | Admitting: Family Medicine

## 2016-10-26 ENCOUNTER — Other Ambulatory Visit (HOSPITAL_BASED_OUTPATIENT_CLINIC_OR_DEPARTMENT_OTHER): Payer: Medicare Other

## 2016-10-26 ENCOUNTER — Ambulatory Visit
Admission: RE | Admit: 2016-10-26 | Discharge: 2016-10-26 | Disposition: A | Discharge status: Home / Self Care | Payer: Medicare Other | Source: Ambulatory Visit | Attending: Radiation Oncology | Admitting: Radiation Oncology

## 2016-10-26 DIAGNOSIS — C154 Malignant neoplasm of middle third of esophagus: Secondary | ICD-10-CM

## 2016-10-26 DIAGNOSIS — I2510 Atherosclerotic heart disease of native coronary artery without angina pectoris: Secondary | ICD-10-CM | POA: Diagnosis not present

## 2016-10-26 DIAGNOSIS — K6289 Other specified diseases of anus and rectum: Secondary | ICD-10-CM | POA: Diagnosis not present

## 2016-10-26 DIAGNOSIS — C159 Malignant neoplasm of esophagus, unspecified: Secondary | ICD-10-CM

## 2016-10-26 DIAGNOSIS — C153 Malignant neoplasm of upper third of esophagus: Secondary | ICD-10-CM | POA: Diagnosis not present

## 2016-10-26 DIAGNOSIS — Z510 Encounter for antineoplastic radiation therapy: Secondary | ICD-10-CM | POA: Diagnosis not present

## 2016-10-26 DIAGNOSIS — C155 Malignant neoplasm of lower third of esophagus: Secondary | ICD-10-CM | POA: Diagnosis not present

## 2016-10-26 DIAGNOSIS — K7460 Unspecified cirrhosis of liver: Secondary | ICD-10-CM | POA: Diagnosis not present

## 2016-10-26 DIAGNOSIS — K769 Liver disease, unspecified: Secondary | ICD-10-CM | POA: Diagnosis not present

## 2016-10-26 LAB — CBC WITH DIFFERENTIAL/PLATELET
BASO%: 0.3 % (ref 0.0–2.0)
BASOS ABS: 0 10*3/uL (ref 0.0–0.1)
EOS ABS: 0.3 10*3/uL (ref 0.0–0.5)
EOS%: 4.6 % (ref 0.0–7.0)
HCT: 44.1 % (ref 38.4–49.9)
HEMOGLOBIN: 14.9 g/dL (ref 13.0–17.1)
LYMPH%: 11.6 % — AB (ref 14.0–49.0)
MCH: 32.8 pg (ref 27.2–33.4)
MCHC: 33.8 g/dL (ref 32.0–36.0)
MCV: 97.1 fL (ref 79.3–98.0)
MONO#: 0.7 10*3/uL (ref 0.1–0.9)
MONO%: 9.6 % (ref 0.0–14.0)
NEUT#: 5.3 10*3/uL (ref 1.5–6.5)
NEUT%: 73.9 % (ref 39.0–75.0)
Platelets: 141 10*3/uL (ref 140–400)
RBC: 4.54 10*6/uL (ref 4.20–5.82)
RDW: 13.8 % (ref 11.0–14.6)
WBC: 7.2 10*3/uL (ref 4.0–10.3)
lymph#: 0.8 10*3/uL — ABNORMAL LOW (ref 0.9–3.3)

## 2016-10-26 LAB — COMPREHENSIVE METABOLIC PANEL
ALBUMIN: 3.3 g/dL — AB (ref 3.5–5.0)
ALK PHOS: 147 U/L (ref 40–150)
ALT: 35 U/L (ref 0–55)
AST: 32 U/L (ref 5–34)
Anion Gap: 8 mEq/L (ref 3–11)
BUN: 43.2 mg/dL — AB (ref 7.0–26.0)
CHLORIDE: 105 meq/L (ref 98–109)
CO2: 26 mEq/L (ref 22–29)
Calcium: 9.8 mg/dL (ref 8.4–10.4)
Creatinine: 1.4 mg/dL — ABNORMAL HIGH (ref 0.7–1.3)
EGFR: 45 mL/min/{1.73_m2} — AB (ref >90)
GLUCOSE: 93 mg/dL (ref 70–140)
Potassium: 4.4 mEq/L (ref 3.5–5.1)
SODIUM: 139 meq/L (ref 136–145)
Total Bilirubin: 0.73 mg/dL (ref 0.20–1.20)
Total Protein: 6.8 g/dL (ref 6.4–8.3)

## 2016-10-26 MED ORDER — BIAFINE EX EMUL
Freq: Every day | CUTANEOUS | Status: DC
  Administered 2016-10-26: 11:00:00 via TOPICAL

## 2016-10-26 NOTE — Progress Notes (Signed)
Pt education done, biafine cream ,my business card, Radiation therapy and you book given, ,discussed ways to manage side effects,skin irritation, throat changes,difficulty swallowing, , nausea, vomiting, diarrhea, low fiber diet, no fresh vegs or fresh fruit, with diarrhea, imodium prn, may need 5-6 smaller meals, with ensure in between meals, fatigue, get plenty rest, and sleep, drink plenty water,stay hydrated, increase protein in diet, ask for nausea rx  When needed, , use biafine on chest area afterrad tx daily and prn, sees MD weekly and prn, teach back given, wife and patient present 10:53 AM'

## 2016-10-27 ENCOUNTER — Encounter: Payer: Self-pay | Admitting: Radiation Oncology

## 2016-10-27 ENCOUNTER — Ambulatory Visit
Admission: RE | Admit: 2016-10-27 | Discharge: 2016-10-27 | Disposition: A | Discharge status: Home / Self Care | Payer: Medicare Other | Source: Ambulatory Visit | Attending: Radiation Oncology | Admitting: Radiation Oncology

## 2016-10-27 DIAGNOSIS — K6289 Other specified diseases of anus and rectum: Secondary | ICD-10-CM | POA: Diagnosis not present

## 2016-10-27 DIAGNOSIS — Z510 Encounter for antineoplastic radiation therapy: Secondary | ICD-10-CM | POA: Diagnosis not present

## 2016-10-27 DIAGNOSIS — C155 Malignant neoplasm of lower third of esophagus: Secondary | ICD-10-CM | POA: Diagnosis not present

## 2016-10-27 DIAGNOSIS — K769 Liver disease, unspecified: Secondary | ICD-10-CM | POA: Diagnosis not present

## 2016-10-27 DIAGNOSIS — C153 Malignant neoplasm of upper third of esophagus: Secondary | ICD-10-CM | POA: Diagnosis not present

## 2016-10-27 DIAGNOSIS — K7460 Unspecified cirrhosis of liver: Secondary | ICD-10-CM | POA: Diagnosis not present

## 2016-10-27 DIAGNOSIS — I2510 Atherosclerotic heart disease of native coronary artery without angina pectoris: Secondary | ICD-10-CM | POA: Diagnosis not present

## 2016-10-27 NOTE — Progress Notes (Signed)
Financial Counseling--spoke with patients wife today--answered questions she had about their insurance and provided information regarding the [employer]

## 2016-10-28 ENCOUNTER — Ambulatory Visit
Admission: RE | Admit: 2016-10-28 | Discharge: 2016-10-28 | Disposition: A | Discharge status: Home / Self Care | Payer: Medicare Other | Source: Ambulatory Visit | Attending: Radiation Oncology | Admitting: Radiation Oncology

## 2016-10-28 DIAGNOSIS — K6289 Other specified diseases of anus and rectum: Secondary | ICD-10-CM | POA: Diagnosis not present

## 2016-10-28 DIAGNOSIS — I2510 Atherosclerotic heart disease of native coronary artery without angina pectoris: Secondary | ICD-10-CM | POA: Diagnosis not present

## 2016-10-28 DIAGNOSIS — K769 Liver disease, unspecified: Secondary | ICD-10-CM | POA: Diagnosis not present

## 2016-10-28 DIAGNOSIS — K7460 Unspecified cirrhosis of liver: Secondary | ICD-10-CM | POA: Diagnosis not present

## 2016-10-28 DIAGNOSIS — C153 Malignant neoplasm of upper third of esophagus: Secondary | ICD-10-CM | POA: Diagnosis not present

## 2016-10-28 DIAGNOSIS — C155 Malignant neoplasm of lower third of esophagus: Secondary | ICD-10-CM | POA: Diagnosis not present

## 2016-10-28 DIAGNOSIS — Z510 Encounter for antineoplastic radiation therapy: Secondary | ICD-10-CM | POA: Diagnosis not present

## 2016-10-29 ENCOUNTER — Encounter: Payer: Self-pay | Admitting: Family Medicine

## 2016-10-29 ENCOUNTER — Ambulatory Visit
Admission: RE | Admit: 2016-10-29 | Discharge: 2016-10-29 | Disposition: A | Discharge status: Home / Self Care | Payer: Medicare Other | Source: Ambulatory Visit | Attending: Radiation Oncology | Admitting: Radiation Oncology

## 2016-10-29 DIAGNOSIS — Z510 Encounter for antineoplastic radiation therapy: Secondary | ICD-10-CM | POA: Diagnosis not present

## 2016-10-29 DIAGNOSIS — C153 Malignant neoplasm of upper third of esophagus: Secondary | ICD-10-CM | POA: Diagnosis not present

## 2016-10-29 DIAGNOSIS — K769 Liver disease, unspecified: Secondary | ICD-10-CM | POA: Diagnosis not present

## 2016-10-29 DIAGNOSIS — C155 Malignant neoplasm of lower third of esophagus: Secondary | ICD-10-CM | POA: Diagnosis not present

## 2016-10-29 DIAGNOSIS — I2510 Atherosclerotic heart disease of native coronary artery without angina pectoris: Secondary | ICD-10-CM | POA: Diagnosis not present

## 2016-10-29 DIAGNOSIS — K6289 Other specified diseases of anus and rectum: Secondary | ICD-10-CM | POA: Diagnosis not present

## 2016-10-29 DIAGNOSIS — K7460 Unspecified cirrhosis of liver: Secondary | ICD-10-CM | POA: Diagnosis not present

## 2016-10-29 NOTE — Progress Notes (Addendum)
  Radiation Oncology         ([phone] ________________________________  Name: Fernando Liu MRN: [id number]  Date: 10/15/2016  DOB: 1926-04-28  SIMULATION AND TREATMENT PLANNING NOTE  DIAGNOSIS:     ICD-10-CM   1. 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  Identity was confirmed.  All relevant records and images related to the planned course of therapy were reviewed.   Written consent to proceed with treatment was confirmed which was freely given after reviewing the details related to the planned course of therapy had been reviewed with the patient.  Then, the patient was set-up in a stable reproducible  supine position for radiation therapy.  CT images were obtained.  Surface markings were placed.    Medically necessary complex treatment device(s) for immobilization:  Vac-lock bag.   The CT images were loaded into the planning software.  Then the target and avoidance structures were contoured.  Treatment planning then occurred.  The radiation prescription was entered and confirmed.  A total of 2 complex treatment devices were fabricated which relate to the designed radiation treatment fields. Each of these customized fields/ complex treatment devices will be used on a daily basis during the radiation course. I have requested : Intensity Modulated Radiotherapy (IMRT) is medically necessary for this case for the following reason:  Sparing of nearby critical normal structures including the spinal cord, heart and lungs.   The patient will undergo daily image guidance to ensure accurate localization of the target, and adequate minimize dose to the normal surrounding structures in close proximity to the target.   PLAN:  The patient will receive 45 Gy in 25 fractions.   Special treatment procedure The patient will also receive concurrent chemotherapy during the treatment. The  patient may therefore experience increased toxicity or side effects and the patient will be monitored for such problems. This may require extra lab work as necessary. This therefore constitutes a special treatment procedure.   ________________________________   Jodelle Gross, MD, PhD

## 2016-10-31 ENCOUNTER — Other Ambulatory Visit: Payer: Self-pay | Admitting: Family Medicine

## 2016-11-01 ENCOUNTER — Ambulatory Visit
Admission: RE | Admit: 2016-11-01 | Discharge: 2016-11-01 | Disposition: A | Discharge status: Home / Self Care | Payer: Medicare Other | Source: Ambulatory Visit | Attending: Radiation Oncology | Admitting: Radiation Oncology

## 2016-11-01 DIAGNOSIS — K7460 Unspecified cirrhosis of liver: Secondary | ICD-10-CM | POA: Diagnosis not present

## 2016-11-01 DIAGNOSIS — I2510 Atherosclerotic heart disease of native coronary artery without angina pectoris: Secondary | ICD-10-CM | POA: Diagnosis not present

## 2016-11-01 DIAGNOSIS — K6289 Other specified diseases of anus and rectum: Secondary | ICD-10-CM | POA: Diagnosis not present

## 2016-11-01 DIAGNOSIS — K769 Liver disease, unspecified: Secondary | ICD-10-CM | POA: Diagnosis not present

## 2016-11-01 DIAGNOSIS — Z510 Encounter for antineoplastic radiation therapy: Secondary | ICD-10-CM | POA: Diagnosis not present

## 2016-11-01 DIAGNOSIS — C153 Malignant neoplasm of upper third of esophagus: Secondary | ICD-10-CM | POA: Diagnosis not present

## 2016-11-01 DIAGNOSIS — C155 Malignant neoplasm of lower third of esophagus: Secondary | ICD-10-CM | POA: Diagnosis not present

## 2016-11-01 NOTE — Progress Notes (Addendum)
Fernando Liu  [phone] [phone]  Clinic Follow Up Note   Patient Care Team: Mosie Lukes, MD as PCP - General (Family Medicine) Becky Sax, MD as Referring Physician (Orthopedic Surgery) Noralyn Pick, MD as Consulting Physician (Ophthalmology) 11/02/2016   CHIEF COMPLAINTS:   follow up adenocarcinoma of the esophagus  Oncology History   Cancer Staging Adenocarcinoma of esophagus [facility]) Staging form: Esophagus - Adenocarcinoma, AJCC 8th Edition - Clinical stage from 09/23/2016: Stage Unknown (cTX, cN0, cM0) - Signed by Truitt Merle, MD on 10/04/2016       Malignant neoplasm of lower third of esophagus ([place])   09/23/2016 Procedure    Upper Endoscopy by Dr. Carlean Purl 09/23/16 IMPRESSION - Malignant esophageal tumor was found in the middle third of the esophagus and in the lower third of the esophagus. - Z-line regular, 45 cm from the incisors. - Congestion and mosaic pattern mucosa in the cardia, gastric fundus and gastric body. - The examination was otherwise normal. - No specimens collected.      09/23/2016 Initial Biopsy    Diagnosis  09/23/16 Esophagus, biopsy, mid to distal esophageal mass - FOCAL INVASIVE ADENOCARCINOMA ASSOCIATED WITH EXTENSIVE HIGH GRADE GLANDULAR DYSPLASIA. - INTESTINAL METAPLASIA CONSISTENT WITH BACKGROUND BARRETT'S ESOPHAGUS. - SEE MICROSCOPIC DESCRIPTION      09/23/2016 Initial Diagnosis    Adenocarcinoma of esophagus ([place])      10/01/2016 Imaging    CT CAP w contrast: Focal concentric wall thickening and enhancement of distal thoracic esophagus, consistent known primary esophageal carcinoma. No definite evidence of metastatic disease within the chest, abdomen, or pelvis.  3 cm enhancing polypoid soft tissue density along the right lateral rectal wall, highly suspicious for primary rectal carcinoma. Recommend correlation with rectal exam and colonoscopy.       10/19/2016 PET scan    PET Scan  IMPRESSION: 1. Hypermetabolic circumferential mass in the lower third of the thoracic esophagus, compatible with known esophageal malignancy. 2. Intensely hypermetabolic right lower rectal mass, compatible with primary rectal malignancy. 3. No evidence of hypermetabolic locoregional metastatic disease for either the lower esophageal or rectal malignancy. 4. No evidence of hypermetabolic distant metastatic disease. 5. Cirrhosis. No hypermetabolic liver masses. Low-attenuation 1.1 cm lateral segment left liver lobe mass, correlating with the hypervascular mass in this location on the recent IV contrast-enhanced CT abdomen study, indeterminate for hepatocellular carcinoma. Recommend attention on follow-up MRI abdomen without and with IV contrast in 3 months. 6. Small to moderate dependent right pleural effusion. 7. Solitary 2 mm solid left upper lobe pulmonary nodule, below PET resolution, for which 3 month stability has been demonstrated, probably benign. 8. Left main and 3 vessel coronary atherosclerosis. 9. Aortic Atherosclerosis (ICD10-I70.0). Stable infrarenal 3.0 cm Abdominal Aortic Aneurysm (ICD10-I71.9). 10. Marked colonic diverticulosis.      10/26/2016 -  Radiation Therapy    concurrent chemoradiation with Dr. Lisbeth Renshaw      10/26/2016 -  Chemotherapy    concurrent chemoradiation with Xeloda (3 tabs BID M-F), starting on 10/26/2016          HISTORY OF PRESENTING ILLNESS: Fernando Liu 83 y.o. male with past medical history of liver cirrhosis, hypertension, CAD, who was referred by his gastroenterologist Dr. Carlean Purl for his recently diagnosed esophageal cancer. He presents to my clinic with his wife.   He was found to have liver cirrhosis about 2 months ago, when he was found to have right pleural effusion by his primary care physician. Further CT scan reviewed liver cirrhosis, with  two indeterminate liver lesions, also liver MRI felt that this lesions are likely benign. Due to his newly diagnosed  liver cirrhosis, Dr. Carlean Purl recommended endoscopy, he underwent EGD on 09/23/2016, which showed a also raises mass in the middle and lower esophagus, biopsy showed adenocarcinoma.  Patient denies dysphagia, or odynophagia. He reports mild hoarseness since 2 years ago. He is a retired Chief Financial Officer, still works Retail buyer, lives at home with his wife. He does have moderate fatigue, but is able to do ADLs, and light activities, but not very physically active. His appetite has been low for the past 2 years, and he has lost a total of 30 pounds over the past 2 years.  He had rectal absceess 20 years ago, which resulted sphincter dysfunction, he has had chronic diarrhea for many years. He started taking oral iron a few months ago, which improved his constipation. Although he is off iron now due to his gastric discomfort, his bowel movements and seems to be normal lately. He denies melena, or blood in stool.    He does drink alcohol moderately, never smoked. He is from [place], patient and his wife has been planning to move back to [place] in the near future.   CURRENT THERAPY: concurrent chemoradiation with Xeloda (3 tabs BID M-F), starting on 10/26/2016    INTERIM HISTORY:  Fernando Liu is here for a follow up and radiation. He began concurrent chemoradiation with Xeloda on 10/26/2016; he takes 3 tablets twice a day Monday through Friday. He can swallow pills without difficulty. He is tolerating this well without side effects. He denies fatigue, diarrhea, hand or foot redness or pain, mouth sores, chest pain, dyspnea, dysphasia, or skin changes. He had decreased appetite before diagnosis, this is unchanged. He feels like he has lost a little weight. He continues to work and do all activities as normal.   MEDICAL HISTORY:  Past Medical History:  Diagnosis Date  . Adenocarcinoma of esophagus ([place]) 09/24/2016  . Anemia 12/28/2015  . CAD (coronary artery disease)   . Chicken pox 83 yrs old  .  Cirrhosis of liver ([place]) 06/29/2016  . Decreased breath sounds 05/27/2016  . Gout 04/24/2015  . High cholesterol   . Hypertension   . Measles as a child  . Medicare annual wellness visit, subsequent 10/31/2012   Living will wife and daughter have a copy, does not want heroic measures Sees cardiology his recent cardiologist is retiring his new doctor will be at [employer]   . Neuropathy   . Osteoporosis 12/01/2015  . Pleural effusion on right 08/01/2016  . Preventative health care 10/31/2012  . Thyroid disease     SURGICAL HISTORY: Past Surgical History:  Procedure Laterality Date  . CATARACT EXTRACTION, BILATERAL     August & November 2012  . ESOPHAGOGASTRODUODENOSCOPY  08/2016   Esophageal cancer  . FOOT SURGERY Left   . HERNIA REPAIR    . PELVIC ABCESS DRAINAGE     states surgery affected control of BM  . TONSILLECTOMY      SOCIAL HISTORY: Social History   Social History  . Marital status: Married    Spouse name: N/A  . Number of children: N/A  . Years of education: N/A   Occupational History  . Semi-retired Chief Financial Officer    Social History Main Topics  . Smoking status: Never Smoker  . Smokeless tobacco: Never Used  . Alcohol use No     Comment: Used to have 2 shots of bourbon daily for many years  but stopped summer 2018  . Drug use: No  . Sexual activity: Not on file   Other Topics Concern  . Not on file   Social History Narrative   Married second time he has 3 daughters. He is a were semiretired Civil engineer, contracting.    FAMILY HISTORY: Family History  Problem Relation Age of Onset  . Stroke Mother   . Hypertension Mother   . Cancer Maternal Grandmother        gastric cancer   . Heart disease Father   . Cancer Daughter 52       lymphoma   . Cancer Cousin        leukemia   . Breast cancer Neg Hx   . Diabetes Neg Hx   . Colon cancer Neg Hx   . Prostate cancer Neg Hx     ALLERGIES:  is allergic to aspirin.  MEDICATIONS:  Current Outpatient Prescriptions    Medication Sig Dispense Refill  . allopurinol (ZYLOPRIM) 100 MG tablet TAKE 1 TABLET (100 MG TOTAL) BY MOUTH DAILY. 30 tablet 6  . atorvastatin (LIPITOR) 10 MG tablet TAKE 1 TABLET BY MOUTH AT BEDTIME 30 tablet 4  . b complex vitamins tablet Take 1 tablet by mouth daily.    . Calcium Carb-Cholecalciferol (CALCIUM 600 + D PO) Take 600 mg by mouth 2 (two) times daily. Pt takes 1/2 pill twice daily.    . capecitabine (XELODA) 500 MG tablet Take 3 tablets (1,500 mg total) by mouth 2 (two) times daily after a meal. On the day of radiation only, Monday through Friday 168 tablet 0  . cholecalciferol (VITAMIN D) 1000 units tablet Take 1,000 Units by mouth daily.    . colestipol (MICRONIZED COLESTIPOL HCL) 1 g tablet Take 1 tablet (1 g total) by mouth 2 (two) times daily. 60 tablet 2  . emollient (BIAFINE) cream Apply 1 application topically daily. Apply to chest and abdomen after rad txs when skin is red or irritation occurs daily and prn    . levothyroxine (SYNTHROID, LEVOTHROID) 75 MCG tablet TAKE 1 TABLET BY MOUTH EVERY DAY 30 tablet 4  . metoprolol tartrate (LOPRESSOR) 25 MG tablet Take 12.5 mg by mouth daily.     . Multiple Vitamins-Minerals (MULTIVITAMIN ADULT PO) Take 1 tablet by mouth 2 (two) times a week.    . nitroGLYCERIN (NITROSTAT) 0.4 MG SL tablet Place 0.4 mg under the tongue every 5 (five) minutes as needed. For chest pain    . ranitidine (ZANTAC) 150 MG capsule Take 1 capsule (150 mg total) by mouth every evening. For acid protection 30 capsule 2  . spironolactone (ALDACTONE) 50 MG tablet Take 1 tablet (50 mg total) by mouth daily. 90 tablet 1  . alendronate (FOSAMAX) 70 MG tablet TAKE 1 TABLET BY MOUTH ONCE PER WEEK, TAKE WITH FULL GLASS OF WATER ON EMPTY STOMACH  4   No current facility-administered medications for this visit.     REVIEW OF SYSTEMS:   Constitutional: Denies Fatigue, fevers, chills or abnormal night sweats. (+) Decreased appetite, 3 pound weight loss in 2 weeks,  stable overall Eyes: Denies blurriness of vision, double vision or watery eyes Ears, nose, mouth, throat, and face: Denies mucositis or sore throat Respiratory: Denies cough, dyspnea or wheezes Cardiovascular: Denies palpitation, chest discomfort or lower extremity swelling Gastrointestinal:  Denies nausea, vomiting, constipation, diarrhea, heartburn or change in bowel habits. Denies dysphagia or odynophagia.  Skin: Denies abnormal skin rashes or changes Lymphatics: Denies new lymphadenopathy or easy  bruising Neurological:Denies numbness, tingling or new weaknesses Behavioral/Psych: Mood is stable, no new changes  All other systems were reviewed with the patient and are negative.  PHYSICAL EXAMINATION:  ECOG PERFORMANCE STATUS: 1 - Symptomatic but completely ambulatory  Vitals:   11/02/16 1122  BP: (!) 120/50  Pulse: 60  Resp: 20  Temp: 97.8 F (36.6 C)  SpO2: 98%   Filed Weights   11/02/16 1122  Weight: 147 lb 3.2 oz (66.8 kg)    GENERAL:alert, no distress and comfortable SKIN: skin color, texture, turgor are normal, no rashes or significant lesions EYES: normal, conjunctiva are pink and non-injected, sclera clear OROPHARYNX:no exudate, no erythema and lips, buccal mucosa, and tongue normal  NECK: supple, thyroid normal size, non-tender, without nodularity LYMPH:  no palpable Cervical or supraclavicular lymphadenopathy LUNGS: clear to auscultation bilaterally with normal breathing effort HEART: regular rate & rhythm, S1 and S2 present, no murmurs and no lower extremity edema ABDOMEN:abdomen soft, non-tender and normal bowel sounds. No palpable hepatomegaly or masses. Musculoskeletal:no cyanosis of digits and no clubbing  PSYCH: alert & oriented x 3 with fluent speech NEURO: no focal motor/sensory deficits  LABORATORY DATA:  I have reviewed the data as listed CBC Latest Ref Rng & Units 11/02/2016 10/26/2016 10/05/2016  WBC 4.0 - 10.3 10e3/uL 6.4 7.2 7.9  Hemoglobin 13.0 -  17.1 g/dL 15.0 14.9 15.5  Hematocrit 38.4 - 49.9 % 44.7 44.1 45.4  Platelets 140 - 400 10e3/uL 139(L) 141 144   CMP Latest Ref Rng & Units 11/02/2016 10/26/2016 10/05/2016  Glucose 70 - 140 mg/dl 97 93 102  BUN 7.0 - 26.0 mg/dL 42.2(H) 43.2(H) 38.6(H)  Creatinine 0.7 - 1.3 mg/dL 1.3 1.4(H) 1.2  Sodium 136 - 145 mEq/L 139 139 139  Potassium 3.5 - 5.1 mEq/L 4.5 4.4 4.9  Chloride 96 - 112 mEq/L - - -  CO2 22 - 29 mEq/L 26 26 25   Calcium 8.4 - 10.4 mg/dL 10.0 9.8 10.1  Total Protein 6.4 - 8.3 g/dL 7.0 6.8 7.4  Total Bilirubin 0.20 - 1.20 mg/dL 1.10 0.73 0.81  Alkaline Phos 40 - 150 U/L 143 147 179(H)  AST 5 - 34 U/L 41(H) 32 44(H)  ALT 0 - 55 U/L 36 35 49   CEA 10/05/16 - 2.41   PATHOLOGY  Diagnosis 10/07/16 Surgical [P], rectal, polyp - TUBULOVILLOUS ADENOMA (SIX FRAGMENTS). - NO HIGH GRADE DYSPLASIA OR MALIGNANCY.  Diagnosis  09/23/16 Esophagus, biopsy, mid to distal esophageal mass - FOCAL INVASIVE ADENOCARCINOMA ASSOCIATED WITH EXTENSIVE HIGH GRADE GLANDULAR DYSPLASIA. - INTESTINAL METAPLASIA CONSISTENT WITH BACKGROUND BARRETT'S ESOPHAGUS. - SEE MICROSCOPIC DESCRIPTION Microscopic Comment There are fragments of squamous and glandular mucosa and the glandular mucosa shows extensive high grade glandular dysplasia associated with Barrett's-type metaplasia. One of the fragments also has focal invasive adenocarcinoma within the mucosal stroma. Called to Dr Celesta Aver office on 09/24/16. (JDP:kh 09/24/16) Claudette Laws MD   PROCEDURES  Flexible Sigmoidoscopy 10/07/16 IMPRESSION - The perianal exam findings include erythematous skin. - The digital rectal exam revealed a 2 cm (diameter) rubbery-textured and smooth rectal mass palpated 2 to 3 cm from the anal verge. The mass was non-circumferential and located predominantly at the right-posterior bowel wall. - A 20 mm polyp was found in the rectum. The polyp was semi-sessile. Biopsies were taken with a cold forceps for histology.  Verification of patient identification for the specimen was done. Estimated blood loss was minimal. - The exam was otherwise without abnormality.  Upper Endoscopy by Dr. Carlean Purl 09/23/16 IMPRESSION -  Malignant esophageal tumor was found in the middle third of the esophagus and in the lower third of the esophagus. - Z-line regular, 45 cm from the incisors. - Congestion and mosaic pattern mucosa in the cardia, gastric fundus and gastric body. - The examination was otherwise normal. - No specimens collected.   RADIOGRAPHIC STUDIES: I have personally reviewed the radiological images as listed and agreed with the findings in the report. Nm Pet Image Initial (pi) Skull Base To Thigh  Result Date: 10/19/2016 CLINICAL DATA:  Initial treatment strategy for esophageal adenocarcinoma. EXAM: NUCLEAR MEDICINE PET SKULL BASE TO THIGH TECHNIQUE: 7.9 mCi F-18 FDG was injected intravenously. Full-ring PET imaging was performed from the skull base to thigh after the radiotracer. CT data was obtained and used for attenuation correction and anatomic localization. FASTING BLOOD GLUCOSE:  Value: 89 mg/dl COMPARISON:  10/01/2016 CT chest, abdomen and pelvis. 07/31/2016 MRI abdomen. FINDINGS: NECK: No hypermetabolic lymph nodes in the neck. CHEST: Hypermetabolic circumferential 2.6 x 2.0 cm mass in the lower third of the thoracic esophagus with max SUV 10.3 (series 4/image 81). No enlarged or hypermetabolic axillary, mediastinal or hilar lymph nodes. Small to moderate dependent right pleural effusion. No pleural hypermetabolism or pleural nodularity. Subsegmental atelectasis in the right middle and right lower lobes. No acute consolidative airspace disease or lung masses. Peripheral left upper lobe 2 mm solid pulmonary nodule (series 8/image 19) is below PET resolution and stable since 06/25/2016. No additional significant pulmonary nodules. Left main and 3 vessel coronary atherosclerosis. Atherosclerotic nonaneurysmal  thoracic aorta. No left pleural effusion. No pneumothorax. Mild gynecomastia, slightly asymmetric to the left. ABDOMEN/PELVIS: Intensely hypermetabolic solid 3.6 x 1.8 cm right lower rectal mass with max SUV 31.1 (series 4/image 180). Diffusely irregular liver surface and relative hypertrophy of the lateral segment left liver lobe, compatible with cirrhosis. No hypermetabolic liver masses. Vague hypodense 1.1 cm lateral segment left liver lobe lesion (series 4/image 95), correlating with the hypervascular lesion seen in this location on the recent contrast-enhanced CT study. No abnormal hypermetabolic activity within the pancreas, adrenal glands, or spleen. No hypermetabolic lymph nodes in the abdomen or pelvis. Bilateral simple renal cysts, largest 4.0 cm in the anterior upper left kidney. Mildly enlarged prostate with nonspecific internal prostatic calcifications. Marked diffuse colonic diverticulosis, most prominent in the sigmoid colon. Atherosclerotic abdominal aorta with stable 3.0 cm infrarenal abdominal aortic aneurysm. Stable small right inguinal hernia containing fat and small amount of trapped fluid. SKELETON: No focal hypermetabolic activity to suggest skeletal metastasis. IMPRESSION: 1. Hypermetabolic circumferential mass in the lower third of the thoracic esophagus, compatible with known esophageal malignancy. 2. Intensely hypermetabolic right lower rectal mass, compatible with primary rectal malignancy. 3. No evidence of hypermetabolic locoregional metastatic disease for either the lower esophageal or rectal malignancy. 4. No evidence of hypermetabolic distant metastatic disease. 5. Cirrhosis. No hypermetabolic liver masses. Low-attenuation 1.1 cm lateral segment left liver lobe mass, correlating with the hypervascular mass in this location on the recent IV contrast-enhanced CT abdomen study, indeterminate for hepatocellular carcinoma. Recommend attention on follow-up MRI abdomen without and with IV  contrast in 3 months. 6. Small to moderate dependent right pleural effusion. 7. Solitary 2 mm solid left upper lobe pulmonary nodule, below PET resolution, for which 3 month stability has been demonstrated, probably benign. 8. Left main and 3 vessel coronary atherosclerosis. 9. Aortic Atherosclerosis (ICD10-I70.0). Stable infrarenal 3.0 cm Abdominal Aortic Aneurysm (ICD10-I71.9). Recommend follow-up aortic ultrasound in 3 years. This recommendation follows ACR consensus guidelines: White Paper  of the ACR Incidental Findings Committee II on Vascular Findings. J Am Coll Radiol 2013; 10:789-794. 10. Marked colonic diverticulosis. Electronically Signed   By: Ilona Sorrel M.D.   On: 10/19/2016 13:17     CT CAP W Contrast 10/01/16  IMPRESSION:  Focal concentric wall thickening and enhancement of distal thoracic esophagus, consistent known primary esophageal carcinoma. No definite evidence of metastatic disease within the chest, abdomen, or pelvis.  Moderate right pleural effusion, mildly decreased in size since prior study.  3 cm enhancing polypoid soft tissue density along the right lateral rectal wall, highly suspicious for primary rectal carcinoma. Recommend correlation with rectal exam and colonoscopy.  Hepatic cirrhosis and findings of portal venous hypertension. Two small hypervascular masses in the right and left lobes, mildly increased and suspicious for multifocal hepatocellular carcinoma.  MR liver w wo contrast 07/31/2016 IMPRESSION: 1. Cirrhosis and portal venous hypertension with splenomegaly. 2. Arterial and portal venous phase focus of hyper enhancement in the high left hepatic lobe is likely a perfusion anomaly. Consider follow-up with pre and post contrast abdominal MRI at 3-6 months. 3. Otherwise, no liver lesion identified. Outside ultrasound not submitted. 4.  Aortic Atherosclerosis (ICD10-I70.0). 5. Moderate right pleural effusion with small volume  perihepatic ascites.  ASSESSMENT & PLAN:  Fernando Liu is a 83 y.o. male with a history of Anemia,CAD, Cirrhosis of Liver, HTN, Neuropathy, Osteoporosis, Pleural effusion and thyroid disease.  1. Adenocarcinoma of the esophagus, min-low esophagus, cTxN0M0 -Pathology results have been explained in great detail -His CT scan reviewed no evidence of distant metastasis, this is reviewed with patient. -PET scan shows no evidence of hypermetabolic locoregional metastatic disease for the esophageal malignancy -Treatment options have previously been discussed with the patient in great detail; he is not an optimal surgical candidate due to his advanced age and comorbidities and liver cirrhosis, he is not an appropriate candidate for intensive systemic chemotherapy such as FOLFOX due to his advanced age and comorbidities, especially his liver cirrhosis -He has begun concurrent chemoradiation with Xeloda 3 tablets twice a day Monday through Friday on 10/26/2016 -He is aware that chemoradiation alone will likely not cure him and that the treatment is palliative -He is aware that he will require close monitoring due to the presence of liver cirrhosis and possibly increased side effects of Xeloda  -Labs reviewed, platelets slightly decreased at 139K, otherwise normal; AST slightly elevated at 41, appears to fluctuate, overall stable, exam unremarkable, he is tolerating treatment well without obvious side effects.  -he will continue chemoradiation with Xeloda 3 tablets twice a day Monday through Friday -Labs weekly while under treatment -Follow-up 11/16/2016 and 11/22/2016   2.  Rectal polyp  -PET scan shows intensely hypermetabolic right lower rectal mass, compatible with malignancy -Previous rectal exam negative for masses; no blood -He underwent a flexible sigmoidoscopy on 10/07/2016 which showed a benign rectal polyp.   3.  Cryptogenic liver cirrhosis with ascites -followed by GI, Dr. Carlean Purl -He  does have 2 indeterminate liver lesions, likely benign based on the MRI, he will be followed by Dr. Carlean Purl.   4. CAD, HTN  -followed by cardiology, [facility]. Donnetta Hutching at [facility] -on metoprolol, aldactone, lipitor; nitro PRN -BP 120/50 today, stable; patient is asymptomatic, will monitor  PLAN:  -Labs reviewed, adequate to continue concurrent chemoradiation with Xeloda 3 tablets twice a day Monday through Friday -Lab weekly while under treatment -Follow-up 11/16/2016 with Dr. Burr Medico  No orders of the defined types were placed in this encounter.   All  questions were answered. The patient knows to call the clinic with any problems, questions or concerns.  I spent 10 minutes counseling the patient face to face. The total time spent in the appointment was 15 minutes and more than 50% was on counseling.  Cira Rue, AGNP-C 11/02/2016  I have seen the patient, examined him. I agree with the assessment and and plan and have edited the notes.   Truitt Merle  11/02/2016

## 2016-11-02 ENCOUNTER — Other Ambulatory Visit (HOSPITAL_BASED_OUTPATIENT_CLINIC_OR_DEPARTMENT_OTHER): Payer: Medicare Other

## 2016-11-02 ENCOUNTER — Ambulatory Visit (HOSPITAL_BASED_OUTPATIENT_CLINIC_OR_DEPARTMENT_OTHER): Payer: Medicare Other | Admitting: Hematology

## 2016-11-02 ENCOUNTER — Encounter: Payer: Self-pay | Admitting: Hematology

## 2016-11-02 ENCOUNTER — Ambulatory Visit
Admission: RE | Admit: 2016-11-02 | Discharge: 2016-11-02 | Disposition: A | Discharge status: Home / Self Care | Payer: Medicare Other | Source: Ambulatory Visit | Attending: Radiation Oncology | Admitting: Radiation Oncology

## 2016-11-02 VITALS — BP 120/50 | HR 60 | Temp 97.8°F | Resp 20 | Ht 68.0 in | Wt 147.2 lb

## 2016-11-02 DIAGNOSIS — K7460 Unspecified cirrhosis of liver: Secondary | ICD-10-CM | POA: Diagnosis not present

## 2016-11-02 DIAGNOSIS — C155 Malignant neoplasm of lower third of esophagus: Secondary | ICD-10-CM | POA: Diagnosis not present

## 2016-11-02 DIAGNOSIS — C159 Malignant neoplasm of esophagus, unspecified: Secondary | ICD-10-CM

## 2016-11-02 DIAGNOSIS — I10 Essential (primary) hypertension: Secondary | ICD-10-CM

## 2016-11-02 DIAGNOSIS — I2510 Atherosclerotic heart disease of native coronary artery without angina pectoris: Secondary | ICD-10-CM | POA: Diagnosis not present

## 2016-11-02 DIAGNOSIS — K769 Liver disease, unspecified: Secondary | ICD-10-CM | POA: Diagnosis not present

## 2016-11-02 DIAGNOSIS — Z510 Encounter for antineoplastic radiation therapy: Secondary | ICD-10-CM | POA: Diagnosis not present

## 2016-11-02 DIAGNOSIS — C154 Malignant neoplasm of middle third of esophagus: Secondary | ICD-10-CM | POA: Diagnosis present

## 2016-11-02 DIAGNOSIS — C153 Malignant neoplasm of upper third of esophagus: Secondary | ICD-10-CM | POA: Diagnosis not present

## 2016-11-02 DIAGNOSIS — K6289 Other specified diseases of anus and rectum: Secondary | ICD-10-CM | POA: Diagnosis not present

## 2016-11-02 LAB — CBC WITH DIFFERENTIAL/PLATELET
BASO%: 0.3 % (ref 0.0–2.0)
Basophils Absolute: 0 10*3/uL (ref 0.0–0.1)
EOS%: 3.8 % (ref 0.0–7.0)
Eosinophils Absolute: 0.2 10*3/uL (ref 0.0–0.5)
HEMATOCRIT: 44.7 % (ref 38.4–49.9)
HEMOGLOBIN: 15 g/dL (ref 13.0–17.1)
LYMPH#: 0.5 10*3/uL — AB (ref 0.9–3.3)
LYMPH%: 7.3 % — ABNORMAL LOW (ref 14.0–49.0)
MCH: 33 pg (ref 27.2–33.4)
MCHC: 33.7 g/dL (ref 32.0–36.0)
MCV: 97.9 fL (ref 79.3–98.0)
MONO#: 0.4 10*3/uL (ref 0.1–0.9)
MONO%: 6.6 % (ref 0.0–14.0)
NEUT%: 82 % — ABNORMAL HIGH (ref 39.0–75.0)
NEUTROS ABS: 5.2 10*3/uL (ref 1.5–6.5)
PLATELETS: 139 10*3/uL — AB (ref 140–400)
RBC: 4.56 10*6/uL (ref 4.20–5.82)
RDW: 13.7 % (ref 11.0–14.6)
WBC: 6.4 10*3/uL (ref 4.0–10.3)

## 2016-11-02 LAB — COMPREHENSIVE METABOLIC PANEL
ALBUMIN: 3.4 g/dL — AB (ref 3.5–5.0)
ALK PHOS: 143 U/L (ref 40–150)
ALT: 36 U/L (ref 0–55)
ANION GAP: 9 meq/L (ref 3–11)
AST: 41 U/L — AB (ref 5–34)
BILIRUBIN TOTAL: 1.1 mg/dL (ref 0.20–1.20)
BUN: 42.2 mg/dL — AB (ref 7.0–26.0)
CALCIUM: 10 mg/dL (ref 8.4–10.4)
CHLORIDE: 104 meq/L (ref 98–109)
CO2: 26 mEq/L (ref 22–29)
CREATININE: 1.3 mg/dL (ref 0.7–1.3)
EGFR: 47 mL/min/{1.73_m2} — ABNORMAL LOW (ref >90)
Glucose: 97 mg/dl (ref 70–140)
Potassium: 4.5 mEq/L (ref 3.5–5.1)
Sodium: 139 mEq/L (ref 136–145)
TOTAL PROTEIN: 7 g/dL (ref 6.4–8.3)

## 2016-11-03 ENCOUNTER — Ambulatory Visit
Admission: RE | Admit: 2016-11-03 | Discharge: 2016-11-03 | Disposition: A | Discharge status: Home / Self Care | Payer: Medicare Other | Source: Ambulatory Visit | Attending: Radiation Oncology | Admitting: Radiation Oncology

## 2016-11-03 ENCOUNTER — Telehealth: Payer: Self-pay | Admitting: Hematology

## 2016-11-03 DIAGNOSIS — C155 Malignant neoplasm of lower third of esophagus: Secondary | ICD-10-CM | POA: Diagnosis not present

## 2016-11-03 DIAGNOSIS — C153 Malignant neoplasm of upper third of esophagus: Secondary | ICD-10-CM | POA: Diagnosis not present

## 2016-11-03 DIAGNOSIS — Z510 Encounter for antineoplastic radiation therapy: Secondary | ICD-10-CM | POA: Diagnosis not present

## 2016-11-03 DIAGNOSIS — K7460 Unspecified cirrhosis of liver: Secondary | ICD-10-CM | POA: Diagnosis not present

## 2016-11-03 DIAGNOSIS — I2510 Atherosclerotic heart disease of native coronary artery without angina pectoris: Secondary | ICD-10-CM | POA: Diagnosis not present

## 2016-11-03 DIAGNOSIS — K6289 Other specified diseases of anus and rectum: Secondary | ICD-10-CM | POA: Diagnosis not present

## 2016-11-03 DIAGNOSIS — K769 Liver disease, unspecified: Secondary | ICD-10-CM | POA: Diagnosis not present

## 2016-11-03 NOTE — Telephone Encounter (Signed)
No 10/9 los. °

## 2016-11-04 ENCOUNTER — Ambulatory Visit
Admission: RE | Admit: 2016-11-04 | Discharge: 2016-11-04 | Disposition: A | Discharge status: Home / Self Care | Payer: Medicare Other | Source: Ambulatory Visit | Attending: Radiation Oncology | Admitting: Radiation Oncology

## 2016-11-04 DIAGNOSIS — I2510 Atherosclerotic heart disease of native coronary artery without angina pectoris: Secondary | ICD-10-CM | POA: Diagnosis not present

## 2016-11-04 DIAGNOSIS — C153 Malignant neoplasm of upper third of esophagus: Secondary | ICD-10-CM | POA: Diagnosis not present

## 2016-11-04 DIAGNOSIS — K769 Liver disease, unspecified: Secondary | ICD-10-CM | POA: Diagnosis not present

## 2016-11-04 DIAGNOSIS — Z510 Encounter for antineoplastic radiation therapy: Secondary | ICD-10-CM | POA: Diagnosis not present

## 2016-11-04 DIAGNOSIS — K7460 Unspecified cirrhosis of liver: Secondary | ICD-10-CM | POA: Diagnosis not present

## 2016-11-04 DIAGNOSIS — C155 Malignant neoplasm of lower third of esophagus: Secondary | ICD-10-CM | POA: Diagnosis not present

## 2016-11-04 DIAGNOSIS — K6289 Other specified diseases of anus and rectum: Secondary | ICD-10-CM | POA: Diagnosis not present

## 2016-11-05 ENCOUNTER — Ambulatory Visit
Admission: RE | Admit: 2016-11-05 | Discharge: 2016-11-05 | Disposition: A | Discharge status: Home / Self Care | Payer: Medicare Other | Source: Ambulatory Visit | Attending: Radiation Oncology | Admitting: Radiation Oncology

## 2016-11-05 DIAGNOSIS — K7460 Unspecified cirrhosis of liver: Secondary | ICD-10-CM | POA: Diagnosis not present

## 2016-11-05 DIAGNOSIS — I2510 Atherosclerotic heart disease of native coronary artery without angina pectoris: Secondary | ICD-10-CM | POA: Diagnosis not present

## 2016-11-05 DIAGNOSIS — Z510 Encounter for antineoplastic radiation therapy: Secondary | ICD-10-CM | POA: Diagnosis not present

## 2016-11-05 DIAGNOSIS — C153 Malignant neoplasm of upper third of esophagus: Secondary | ICD-10-CM | POA: Diagnosis not present

## 2016-11-05 DIAGNOSIS — C155 Malignant neoplasm of lower third of esophagus: Secondary | ICD-10-CM | POA: Diagnosis not present

## 2016-11-05 DIAGNOSIS — K6289 Other specified diseases of anus and rectum: Secondary | ICD-10-CM | POA: Diagnosis not present

## 2016-11-05 DIAGNOSIS — K769 Liver disease, unspecified: Secondary | ICD-10-CM | POA: Diagnosis not present

## 2016-11-08 ENCOUNTER — Ambulatory Visit
Admission: RE | Admit: 2016-11-08 | Discharge: 2016-11-08 | Disposition: A | Discharge status: Home / Self Care | Payer: Medicare Other | Source: Ambulatory Visit | Attending: Radiation Oncology | Admitting: Radiation Oncology

## 2016-11-08 DIAGNOSIS — Z510 Encounter for antineoplastic radiation therapy: Secondary | ICD-10-CM | POA: Diagnosis not present

## 2016-11-08 DIAGNOSIS — K769 Liver disease, unspecified: Secondary | ICD-10-CM | POA: Diagnosis not present

## 2016-11-08 DIAGNOSIS — C153 Malignant neoplasm of upper third of esophagus: Secondary | ICD-10-CM | POA: Diagnosis not present

## 2016-11-08 DIAGNOSIS — K6289 Other specified diseases of anus and rectum: Secondary | ICD-10-CM | POA: Diagnosis not present

## 2016-11-08 DIAGNOSIS — C155 Malignant neoplasm of lower third of esophagus: Secondary | ICD-10-CM | POA: Diagnosis not present

## 2016-11-08 DIAGNOSIS — I2510 Atherosclerotic heart disease of native coronary artery without angina pectoris: Secondary | ICD-10-CM | POA: Diagnosis not present

## 2016-11-08 DIAGNOSIS — K7460 Unspecified cirrhosis of liver: Secondary | ICD-10-CM | POA: Diagnosis not present

## 2016-11-09 ENCOUNTER — Other Ambulatory Visit (HOSPITAL_BASED_OUTPATIENT_CLINIC_OR_DEPARTMENT_OTHER): Payer: Medicare Other

## 2016-11-09 ENCOUNTER — Ambulatory Visit
Admission: RE | Admit: 2016-11-09 | Discharge: 2016-11-09 | Disposition: A | Discharge status: Home / Self Care | Payer: Medicare Other | Source: Ambulatory Visit | Attending: Radiation Oncology | Admitting: Radiation Oncology

## 2016-11-09 DIAGNOSIS — C159 Malignant neoplasm of esophagus, unspecified: Secondary | ICD-10-CM

## 2016-11-09 DIAGNOSIS — C155 Malignant neoplasm of lower third of esophagus: Secondary | ICD-10-CM

## 2016-11-09 DIAGNOSIS — Z510 Encounter for antineoplastic radiation therapy: Secondary | ICD-10-CM | POA: Diagnosis not present

## 2016-11-09 DIAGNOSIS — K6289 Other specified diseases of anus and rectum: Secondary | ICD-10-CM | POA: Diagnosis not present

## 2016-11-09 DIAGNOSIS — K7460 Unspecified cirrhosis of liver: Secondary | ICD-10-CM | POA: Diagnosis not present

## 2016-11-09 DIAGNOSIS — K769 Liver disease, unspecified: Secondary | ICD-10-CM | POA: Diagnosis not present

## 2016-11-09 DIAGNOSIS — I2510 Atherosclerotic heart disease of native coronary artery without angina pectoris: Secondary | ICD-10-CM | POA: Diagnosis not present

## 2016-11-09 DIAGNOSIS — C153 Malignant neoplasm of upper third of esophagus: Secondary | ICD-10-CM | POA: Diagnosis not present

## 2016-11-09 LAB — COMPREHENSIVE METABOLIC PANEL
ALBUMIN: 3.2 g/dL — AB (ref 3.5–5.0)
ALT: 29 U/L (ref 0–55)
AST: 29 U/L (ref 5–34)
Alkaline Phosphatase: 130 U/L (ref 40–150)
Anion Gap: 7 mEq/L (ref 3–11)
BUN: 42.3 mg/dL — AB (ref 7.0–26.0)
CHLORIDE: 104 meq/L (ref 98–109)
CO2: 26 meq/L (ref 22–29)
Calcium: 9.5 mg/dL (ref 8.4–10.4)
Creatinine: 1.3 mg/dL (ref 0.7–1.3)
EGFR: 49 mL/min/{1.73_m2} — ABNORMAL LOW (ref >60)
GLUCOSE: 82 mg/dL (ref 70–140)
POTASSIUM: 4.7 meq/L (ref 3.5–5.1)
SODIUM: 138 meq/L (ref 136–145)
Total Bilirubin: 1.06 mg/dL (ref 0.20–1.20)
Total Protein: 6.7 g/dL (ref 6.4–8.3)

## 2016-11-09 LAB — CBC WITH DIFFERENTIAL/PLATELET
BASO%: 0.1 % (ref 0.0–2.0)
BASOS ABS: 0 10*3/uL (ref 0.0–0.1)
EOS%: 4.1 % (ref 0.0–7.0)
Eosinophils Absolute: 0.3 10*3/uL (ref 0.0–0.5)
HCT: 42.9 % (ref 38.4–49.9)
HEMOGLOBIN: 14.6 g/dL (ref 13.0–17.1)
LYMPH%: 5.2 % — ABNORMAL LOW (ref 14.0–49.0)
MCH: 33.4 pg (ref 27.2–33.4)
MCHC: 34.1 g/dL (ref 32.0–36.0)
MCV: 98.1 fL — ABNORMAL HIGH (ref 79.3–98.0)
MONO#: 0.5 10*3/uL (ref 0.1–0.9)
MONO%: 6.7 % (ref 0.0–14.0)
NEUT#: 5.8 10*3/uL (ref 1.5–6.5)
NEUT%: 83.9 % — ABNORMAL HIGH (ref 39.0–75.0)
Platelets: 152 10*3/uL (ref 140–400)
RBC: 4.38 10*6/uL (ref 4.20–5.82)
RDW: 13.2 % (ref 11.0–14.6)
WBC: 6.9 10*3/uL (ref 4.0–10.3)
lymph#: 0.4 10*3/uL — ABNORMAL LOW (ref 0.9–3.3)

## 2016-11-10 ENCOUNTER — Ambulatory Visit
Admission: RE | Admit: 2016-11-10 | Discharge: 2016-11-10 | Disposition: A | Discharge status: Home / Self Care | Payer: Medicare Other | Source: Ambulatory Visit | Attending: Radiation Oncology | Admitting: Radiation Oncology

## 2016-11-10 DIAGNOSIS — K7460 Unspecified cirrhosis of liver: Secondary | ICD-10-CM | POA: Diagnosis not present

## 2016-11-10 DIAGNOSIS — I2510 Atherosclerotic heart disease of native coronary artery without angina pectoris: Secondary | ICD-10-CM | POA: Diagnosis not present

## 2016-11-10 DIAGNOSIS — K769 Liver disease, unspecified: Secondary | ICD-10-CM | POA: Diagnosis not present

## 2016-11-10 DIAGNOSIS — Z510 Encounter for antineoplastic radiation therapy: Secondary | ICD-10-CM | POA: Diagnosis not present

## 2016-11-10 DIAGNOSIS — C155 Malignant neoplasm of lower third of esophagus: Secondary | ICD-10-CM | POA: Diagnosis not present

## 2016-11-10 DIAGNOSIS — C153 Malignant neoplasm of upper third of esophagus: Secondary | ICD-10-CM | POA: Diagnosis not present

## 2016-11-10 DIAGNOSIS — K6289 Other specified diseases of anus and rectum: Secondary | ICD-10-CM | POA: Diagnosis not present

## 2016-11-11 ENCOUNTER — Ambulatory Visit
Admission: RE | Admit: 2016-11-11 | Discharge: 2016-11-11 | Disposition: A | Discharge status: Home / Self Care | Payer: Medicare Other | Source: Ambulatory Visit | Attending: Radiation Oncology | Admitting: Radiation Oncology

## 2016-11-11 DIAGNOSIS — C155 Malignant neoplasm of lower third of esophagus: Secondary | ICD-10-CM | POA: Diagnosis not present

## 2016-11-11 DIAGNOSIS — K769 Liver disease, unspecified: Secondary | ICD-10-CM | POA: Diagnosis not present

## 2016-11-11 DIAGNOSIS — I2510 Atherosclerotic heart disease of native coronary artery without angina pectoris: Secondary | ICD-10-CM | POA: Diagnosis not present

## 2016-11-11 DIAGNOSIS — K7460 Unspecified cirrhosis of liver: Secondary | ICD-10-CM | POA: Diagnosis not present

## 2016-11-11 DIAGNOSIS — K6289 Other specified diseases of anus and rectum: Secondary | ICD-10-CM | POA: Diagnosis not present

## 2016-11-11 DIAGNOSIS — Z510 Encounter for antineoplastic radiation therapy: Secondary | ICD-10-CM | POA: Diagnosis not present

## 2016-11-11 DIAGNOSIS — C153 Malignant neoplasm of upper third of esophagus: Secondary | ICD-10-CM | POA: Diagnosis not present

## 2016-11-12 ENCOUNTER — Ambulatory Visit
Admission: RE | Admit: 2016-11-12 | Discharge: 2016-11-12 | Disposition: A | Discharge status: Home / Self Care | Payer: Medicare Other | Source: Ambulatory Visit | Attending: Radiation Oncology | Admitting: Radiation Oncology

## 2016-11-12 DIAGNOSIS — K6289 Other specified diseases of anus and rectum: Secondary | ICD-10-CM | POA: Diagnosis not present

## 2016-11-12 DIAGNOSIS — C153 Malignant neoplasm of upper third of esophagus: Secondary | ICD-10-CM | POA: Diagnosis not present

## 2016-11-12 DIAGNOSIS — K769 Liver disease, unspecified: Secondary | ICD-10-CM | POA: Diagnosis not present

## 2016-11-12 DIAGNOSIS — I2510 Atherosclerotic heart disease of native coronary artery without angina pectoris: Secondary | ICD-10-CM | POA: Diagnosis not present

## 2016-11-12 DIAGNOSIS — C155 Malignant neoplasm of lower third of esophagus: Secondary | ICD-10-CM | POA: Diagnosis not present

## 2016-11-12 DIAGNOSIS — K7460 Unspecified cirrhosis of liver: Secondary | ICD-10-CM | POA: Diagnosis not present

## 2016-11-12 DIAGNOSIS — Z510 Encounter for antineoplastic radiation therapy: Secondary | ICD-10-CM | POA: Diagnosis not present

## 2016-11-15 ENCOUNTER — Ambulatory Visit
Admission: RE | Admit: 2016-11-15 | Discharge: 2016-11-15 | Disposition: A | Discharge status: Home / Self Care | Payer: Medicare Other | Source: Ambulatory Visit | Attending: Radiation Oncology | Admitting: Radiation Oncology

## 2016-11-15 DIAGNOSIS — C153 Malignant neoplasm of upper third of esophagus: Secondary | ICD-10-CM | POA: Diagnosis not present

## 2016-11-15 DIAGNOSIS — I2510 Atherosclerotic heart disease of native coronary artery without angina pectoris: Secondary | ICD-10-CM | POA: Diagnosis not present

## 2016-11-15 DIAGNOSIS — Z510 Encounter for antineoplastic radiation therapy: Secondary | ICD-10-CM | POA: Diagnosis not present

## 2016-11-15 DIAGNOSIS — C155 Malignant neoplasm of lower third of esophagus: Secondary | ICD-10-CM | POA: Diagnosis not present

## 2016-11-15 DIAGNOSIS — K6289 Other specified diseases of anus and rectum: Secondary | ICD-10-CM | POA: Diagnosis not present

## 2016-11-15 DIAGNOSIS — K7460 Unspecified cirrhosis of liver: Secondary | ICD-10-CM | POA: Diagnosis not present

## 2016-11-15 DIAGNOSIS — K769 Liver disease, unspecified: Secondary | ICD-10-CM | POA: Diagnosis not present

## 2016-11-15 MED FILL — XELODA 500 MG TABLET: 500 | 8 days supply | Qty: 36 | Fill #1

## 2016-11-16 ENCOUNTER — Ambulatory Visit
Admission: RE | Admit: 2016-11-16 | Discharge: 2016-11-16 | Disposition: A | Discharge status: Home / Self Care | Payer: Medicare Other | Source: Ambulatory Visit | Attending: Radiation Oncology | Admitting: Radiation Oncology

## 2016-11-16 ENCOUNTER — Other Ambulatory Visit (HOSPITAL_BASED_OUTPATIENT_CLINIC_OR_DEPARTMENT_OTHER): Payer: Medicare Other

## 2016-11-16 ENCOUNTER — Ambulatory Visit (HOSPITAL_BASED_OUTPATIENT_CLINIC_OR_DEPARTMENT_OTHER): Payer: Medicare Other | Admitting: Hematology

## 2016-11-16 VITALS — BP 119/55 | HR 58 | Temp 97.6°F | Resp 20 | Ht 68.0 in | Wt 145.5 lb

## 2016-11-16 DIAGNOSIS — C153 Malignant neoplasm of upper third of esophagus: Secondary | ICD-10-CM | POA: Diagnosis not present

## 2016-11-16 DIAGNOSIS — Z79890 Hormone replacement therapy (postmenopausal): Secondary | ICD-10-CM | POA: Diagnosis not present

## 2016-11-16 DIAGNOSIS — Z510 Encounter for antineoplastic radiation therapy: Secondary | ICD-10-CM | POA: Diagnosis present

## 2016-11-16 DIAGNOSIS — I10 Essential (primary) hypertension: Secondary | ICD-10-CM

## 2016-11-16 DIAGNOSIS — R634 Abnormal weight loss: Secondary | ICD-10-CM

## 2016-11-16 DIAGNOSIS — K769 Liver disease, unspecified: Secondary | ICD-10-CM

## 2016-11-16 DIAGNOSIS — Z8249 Family history of ischemic heart disease and other diseases of the circulatory system: Secondary | ICD-10-CM | POA: Diagnosis not present

## 2016-11-16 DIAGNOSIS — R630 Anorexia: Secondary | ICD-10-CM | POA: Diagnosis not present

## 2016-11-16 DIAGNOSIS — M109 Gout, unspecified: Secondary | ICD-10-CM | POA: Diagnosis not present

## 2016-11-16 DIAGNOSIS — E7800 Pure hypercholesterolemia, unspecified: Secondary | ICD-10-CM | POA: Diagnosis not present

## 2016-11-16 DIAGNOSIS — Z79899 Other long term (current) drug therapy: Secondary | ICD-10-CM | POA: Diagnosis not present

## 2016-11-16 DIAGNOSIS — M810 Age-related osteoporosis without current pathological fracture: Secondary | ICD-10-CM | POA: Diagnosis not present

## 2016-11-16 DIAGNOSIS — Z886 Allergy status to analgesic agent status: Secondary | ICD-10-CM | POA: Diagnosis not present

## 2016-11-16 DIAGNOSIS — C155 Malignant neoplasm of lower third of esophagus: Secondary | ICD-10-CM

## 2016-11-16 DIAGNOSIS — K7460 Unspecified cirrhosis of liver: Secondary | ICD-10-CM | POA: Diagnosis not present

## 2016-11-16 DIAGNOSIS — Z823 Family history of stroke: Secondary | ICD-10-CM | POA: Diagnosis not present

## 2016-11-16 DIAGNOSIS — C159 Malignant neoplasm of esophagus, unspecified: Secondary | ICD-10-CM

## 2016-11-16 DIAGNOSIS — I2510 Atherosclerotic heart disease of native coronary artery without angina pectoris: Secondary | ICD-10-CM | POA: Diagnosis not present

## 2016-11-16 DIAGNOSIS — E079 Disorder of thyroid, unspecified: Secondary | ICD-10-CM | POA: Diagnosis not present

## 2016-11-16 DIAGNOSIS — K6289 Other specified diseases of anus and rectum: Secondary | ICD-10-CM | POA: Diagnosis not present

## 2016-11-16 DIAGNOSIS — G629 Polyneuropathy, unspecified: Secondary | ICD-10-CM | POA: Diagnosis not present

## 2016-11-16 LAB — CBC WITH DIFFERENTIAL/PLATELET
BASO%: 0.2 % (ref 0.0–2.0)
Basophils Absolute: 0 10*3/uL (ref 0.0–0.1)
EOS ABS: 0.4 10*3/uL (ref 0.0–0.5)
EOS%: 7.2 % — ABNORMAL HIGH (ref 0.0–7.0)
HCT: 43.5 % (ref 38.4–49.9)
HEMOGLOBIN: 14.8 g/dL (ref 13.0–17.1)
LYMPH%: 5 % — AB (ref 14.0–49.0)
MCH: 33.7 pg — ABNORMAL HIGH (ref 27.2–33.4)
MCHC: 34.1 g/dL (ref 32.0–36.0)
MCV: 98.8 fL — ABNORMAL HIGH (ref 79.3–98.0)
MONO#: 0.3 10*3/uL (ref 0.1–0.9)
MONO%: 6 % (ref 0.0–14.0)
NEUT%: 81.6 % — ABNORMAL HIGH (ref 39.0–75.0)
NEUTROS ABS: 4.7 10*3/uL (ref 1.5–6.5)
Platelets: 149 10*3/uL (ref 140–400)
RBC: 4.4 10*6/uL (ref 4.20–5.82)
RDW: 13.3 % (ref 11.0–14.6)
WBC: 5.8 10*3/uL (ref 4.0–10.3)
lymph#: 0.3 10*3/uL — ABNORMAL LOW (ref 0.9–3.3)

## 2016-11-16 LAB — COMPREHENSIVE METABOLIC PANEL
ALBUMIN: 3.3 g/dL — AB (ref 3.5–5.0)
ALK PHOS: 125 U/L (ref 40–150)
ALT: 25 U/L (ref 0–55)
AST: 29 U/L (ref 5–34)
Anion Gap: 8 mEq/L (ref 3–11)
BILIRUBIN TOTAL: 0.97 mg/dL (ref 0.20–1.20)
BUN: 42.3 mg/dL — AB (ref 7.0–26.0)
CO2: 22 meq/L (ref 22–29)
CREATININE: 1.3 mg/dL (ref 0.7–1.3)
Calcium: 8.9 mg/dL (ref 8.4–10.4)
Chloride: 104 mEq/L (ref 98–109)
EGFR: 48 mL/min/{1.73_m2} — ABNORMAL LOW (ref >60)
GLUCOSE: 119 mg/dL (ref 70–140)
Potassium: 4.5 mEq/L (ref 3.5–5.1)
SODIUM: 134 meq/L — AB (ref 136–145)
TOTAL PROTEIN: 6.7 g/dL (ref 6.4–8.3)

## 2016-11-16 NOTE — Progress Notes (Signed)
[place]  [phone]) [phone] [phone]  Clinic Follow Up Note   Patient Care Team: Mosie Lukes, MD as PCP - General (Family Medicine) Becky Sax, MD as Referring Physician (Orthopedic Surgery) Noralyn Pick, MD as Consulting Physician (Ophthalmology) 11/16/2016   CHIEF COMPLAINTS:   follow up adenocarcinoma of the esophagus  Oncology History   Cancer Staging Adenocarcinoma of esophagus [facility]) Staging form: Esophagus - Adenocarcinoma, AJCC 8th Edition - Clinical stage from 09/23/2016: Stage Unknown (cTX, cN0, cM0) - Signed by Truitt Merle, MD on 10/04/2016       Malignant neoplasm of lower third of esophagus ([place])   09/23/2016 Procedure    Upper Endoscopy by Dr. Carlean Purl 09/23/16 IMPRESSION - Malignant esophageal tumor was found in the middle third of the esophagus and in the lower third of the esophagus. - Z-line regular, 45 cm from the incisors. - Congestion and mosaic pattern mucosa in the cardia, gastric fundus and gastric body. - The examination was otherwise normal. - No specimens collected.      09/23/2016 Initial Biopsy    Diagnosis  09/23/16 Esophagus, biopsy, mid to distal esophageal mass - FOCAL INVASIVE ADENOCARCINOMA ASSOCIATED WITH EXTENSIVE HIGH GRADE GLANDULAR DYSPLASIA. - INTESTINAL METAPLASIA CONSISTENT WITH BACKGROUND BARRETT'S ESOPHAGUS. - SEE MICROSCOPIC DESCRIPTION      09/23/2016 Initial Diagnosis    Adenocarcinoma of esophagus ([place])      10/01/2016 Imaging    CT CAP w contrast: Focal concentric wall thickening and enhancement of distal thoracic esophagus, consistent known primary esophageal carcinoma. No definite evidence of metastatic disease within the chest, abdomen, or pelvis.  3 cm enhancing polypoid soft tissue density along the right lateral rectal wall, highly suspicious for primary rectal carcinoma. Recommend correlation with rectal exam and colonoscopy.       10/19/2016 PET scan    PET  Scan IMPRESSION: 1. Hypermetabolic circumferential mass in the lower third of the thoracic esophagus, compatible with known esophageal malignancy. 2. Intensely hypermetabolic right lower rectal mass, compatible with primary rectal malignancy. 3. No evidence of hypermetabolic locoregional metastatic disease for either the lower esophageal or rectal malignancy. 4. No evidence of hypermetabolic distant metastatic disease. 5. Cirrhosis. No hypermetabolic liver masses. Low-attenuation 1.1 cm lateral segment left liver lobe mass, correlating with the hypervascular mass in this location on the recent IV contrast-enhanced CT abdomen study, indeterminate for hepatocellular carcinoma. Recommend attention on follow-up MRI abdomen without and with IV contrast in 3 months. 6. Small to moderate dependent right pleural effusion. 7. Solitary 2 mm solid left upper lobe pulmonary nodule, below PET resolution, for which 3 month stability has been demonstrated, probably benign. 8. Left main and 3 vessel coronary atherosclerosis. 9. Aortic Atherosclerosis (ICD10-I70.0). Stable infrarenal 3.0 cm Abdominal Aortic Aneurysm (ICD10-I71.9). 10. Marked colonic diverticulosis.      10/26/2016 -  Radiation Therapy    concurrent chemoradiation with Dr. Lisbeth Renshaw      10/26/2016 -  Chemotherapy    concurrent chemoradiation with Xeloda (3 tabs BID M-F), starting on 10/26/2016          HISTORY OF PRESENTING ILLNESS: Fernando Liu 83 y.o. male with past medical history of liver cirrhosis, hypertension, CAD, who was referred by his gastroenterologist Dr. Carlean Purl for his recently diagnosed esophageal cancer. He presents to my clinic with his wife.   He was found to have liver cirrhosis about 2 months ago, when he was found to have right pleural effusion by his primary care physician. Further CT scan reviewed liver cirrhosis, with  two indeterminate liver lesions, also liver MRI felt that this lesions are likely benign. Due to his newly  diagnosed liver cirrhosis, Dr. Carlean Purl recommended endoscopy, he underwent EGD on 09/23/2016, which showed a also raises mass in the middle and lower esophagus, biopsy showed adenocarcinoma.  Patient denies dysphagia, or odynophagia. He reports mild hoarseness since 2 years ago. He is a retired Chief Financial Officer, still works Retail buyer, lives at home with his wife. He does have moderate fatigue, but is able to do ADLs, and light activities, but not very physically active. His appetite has been low for the past 2 years, and he has lost a total of 30 pounds over the past 2 years.  He had rectal absceess 20 years ago, which resulted sphincter dysfunction, he has had chronic diarrhea for many years. He started taking oral iron a few months ago, which improved his constipation. Although he is off iron now due to his gastric discomfort, his bowel movements and seems to be normal lately. He denies melena, or blood in stool.    He does drink alcohol moderately, never smoked. He is from [place], patient and his wife has been planning to move back to [place] in the near future.   CURRENT THERAPY: concurrent chemoradiation with Xeloda (500mg  3 tabs BID M-F), started on 10/26/2016   INTERIM HISTORY:  Jaaron Oleson is here for a follow up. He continues to take Xeloda which he began on 10/26/16 which he has overall tolerated well. He reports that in the interim he has developed some reflux with some increased flatus production. He also states that his appetite continues to be reduced, and he has lost about 5lbs in the interim. He is aware of both of these issues and he has been trying to force himself to eat more as he can tolerate. He denies any DOE, or any other associated symptoms.   The patient notes that since last being seen by Cira Rue, my NP, last week and he states that he had two episodes of lightheadedness over the weekend. He thinks this may be related to his osteoporosis medication. He called Dr  Randel Pigg who prescribed this medication who informed him that he can stop this medication to see if this would help with this.    MEDICAL HISTORY:  Past Medical History:  Diagnosis Date  . Adenocarcinoma of esophagus ([place]) 09/24/2016  . Anemia 12/28/2015  . CAD (coronary artery disease)   . Chicken pox 83 yrs old  . Cirrhosis of liver ([place]) 06/29/2016  . Decreased breath sounds 05/27/2016  . Gout 04/24/2015  . High cholesterol   . Hypertension   . Measles as a child  . Medicare annual wellness visit, subsequent 10/31/2012   Living will wife and daughter have a copy, does not want heroic measures Sees cardiology his recent cardiologist is retiring his new doctor will be at [employer]   . Neuropathy   . Osteoporosis 12/01/2015  . Pleural effusion on right 08/01/2016  . Preventative health care 10/31/2012  . Thyroid disease     SURGICAL HISTORY: Past Surgical History:  Procedure Laterality Date  . CATARACT EXTRACTION, BILATERAL     August & November 2012  . ESOPHAGOGASTRODUODENOSCOPY  08/2016   Esophageal cancer  . FOOT SURGERY Left   . HERNIA REPAIR    . PELVIC ABCESS DRAINAGE     states surgery affected control of BM  . TONSILLECTOMY      SOCIAL HISTORY: Social History   Social History  . Marital  status: Married    Spouse name: N/A  . Number of children: N/A  . Years of education: N/A   Occupational History  . Semi-retired Chief Financial Officer    Social History Main Topics  . Smoking status: Never Smoker  . Smokeless tobacco: Never Used  . Alcohol use No     Comment: Used to have 2 shots of bourbon daily for many years but stopped summer 2018  . Drug use: No  . Sexual activity: Not on file   Other Topics Concern  . Not on file   Social History Narrative   Married second time he has 3 daughters. He is a were semiretired Civil engineer, contracting.    FAMILY HISTORY: Family History  Problem Relation Age of Onset  . Stroke Mother   . Hypertension Mother   . Cancer Maternal Grandmother         gastric cancer   . Heart disease Father   . Cancer Daughter 45       lymphoma   . Cancer Cousin        leukemia   . Breast cancer Neg Hx   . Diabetes Neg Hx   . Colon cancer Neg Hx   . Prostate cancer Neg Hx     ALLERGIES:  is allergic to aspirin.  MEDICATIONS:  Current Outpatient Prescriptions  Medication Sig Dispense Refill  . alendronate (FOSAMAX) 70 MG tablet TAKE 1 TABLET BY MOUTH ONCE PER WEEK, TAKE WITH FULL GLASS OF WATER ON EMPTY STOMACH  4  . allopurinol (ZYLOPRIM) 100 MG tablet TAKE 1 TABLET (100 MG TOTAL) BY MOUTH DAILY. 30 tablet 6  . atorvastatin (LIPITOR) 10 MG tablet TAKE 1 TABLET BY MOUTH AT BEDTIME 30 tablet 4  . b complex vitamins tablet Take 1 tablet by mouth daily.    . Calcium Carb-Cholecalciferol (CALCIUM 600 + D PO) Take 600 mg by mouth 2 (two) times daily. Pt takes 1/2 pill twice daily.    . cholecalciferol (VITAMIN D) 1000 units tablet Take 1,000 Units by mouth daily.    . colestipol (MICRONIZED COLESTIPOL HCL) 1 g tablet Take 1 tablet (1 g total) by mouth 2 (two) times daily. 60 tablet 2  . emollient (BIAFINE) cream Apply 1 application topically daily. Apply to chest and abdomen after rad txs when skin is red or irritation occurs daily and prn    . levothyroxine (SYNTHROID, LEVOTHROID) 75 MCG tablet TAKE 1 TABLET BY MOUTH EVERY DAY 30 tablet 4  . metoprolol tartrate (LOPRESSOR) 25 MG tablet Take 12.5 mg by mouth daily.     . Multiple Vitamins-Minerals (MULTIVITAMIN ADULT PO) Take 1 tablet by mouth 2 (two) times a week.    . nitroGLYCERIN (NITROSTAT) 0.4 MG SL tablet Place 0.4 mg under the tongue every 5 (five) minutes as needed. For chest pain    . ranitidine (ZANTAC) 150 MG capsule Take 1 capsule (150 mg total) by mouth every evening. For acid protection 30 capsule 2  . spironolactone (ALDACTONE) 50 MG tablet Take 1 tablet (50 mg total) by mouth daily. 90 tablet 1   No current facility-administered medications for this visit.     REVIEW OF SYSTEMS:     Constitutional: Denies Fatigue, fevers, chills or abnormal night sweats. (+) Decreased appetite, 5 pound weight loss in 3 weeks, stable overall Eyes: Denies blurriness of vision, double vision or watery eyes Ears, nose, mouth, throat, and face: Denies mucositis or sore throat Respiratory: Denies cough, dyspnea or wheezes Cardiovascular: Denies palpitation, chest discomfort or  lower extremity swelling Gastrointestinal:  Denies nausea, vomiting, constipation, diarrhea, heartburn or change in bowel habits. Denies dysphagia or odynophagia.  Skin: Denies abnormal skin rashes or changes Lymphatics: Denies new lymphadenopathy or easy bruising Neurological:Denies numbness, tingling or new weaknesses Behavioral/Psych: Mood is stable, no new changes  All other systems were reviewed with the patient and are negative.  PHYSICAL EXAMINATION:  ECOG PERFORMANCE STATUS: 1 - Symptomatic but completely ambulatory  Vitals:   11/16/16 1013  BP: (!) 119/55  Pulse: (!) 58  Resp: 20  Temp: 97.6 F (36.4 C)  SpO2: 100%   Filed Weights   11/16/16 1013  Weight: 145 lb 8 oz (66 kg)    GENERAL:alert, no distress and comfortable SKIN: skin color, texture, turgor are normal, no rashes or significant lesions EYES: normal, conjunctiva are pink and non-injected, sclera clear OROPHARYNX:no exudate, no erythema and lips, buccal mucosa, and tongue normal  NECK: supple, thyroid normal size, non-tender, without nodularity LYMPH:  no palpable Cervical or supraclavicular lymphadenopathy LUNGS: clear to auscultation bilaterally with normal breathing effort HEART: regular rate & rhythm, S1 and S2 present, no murmurs and no lower extremity edema ABDOMEN:abdomen soft, non-tender and normal bowel sounds. No palpable hepatomegaly or masses. Musculoskeletal:no cyanosis of digits and no clubbing  PSYCH: alert & oriented x 3 with fluent speech NEURO: no focal motor/sensory deficits  LABORATORY DATA:  I have reviewed the  data as listed CBC Latest Ref Rng & Units 11/16/2016 11/09/2016 11/02/2016  WBC 4.0 - 10.3 10e3/uL 5.8 6.9 6.4  Hemoglobin 13.0 - 17.1 g/dL 14.8 14.6 15.0  Hematocrit 38.4 - 49.9 % 43.5 42.9 44.7  Platelets 140 - 400 10e3/uL 149 152 139(L)   CMP Latest Ref Rng & Units 11/16/2016 11/09/2016 11/02/2016  Glucose 70 - 140 mg/dl 119 82 97  BUN 7.0 - 26.0 mg/dL 42.3(H) 42.3(H) 42.2(H)  Creatinine 0.7 - 1.3 mg/dL 1.3 1.3 1.3  Sodium 136 - 145 mEq/L 134(L) 138 139  Potassium 3.5 - 5.1 mEq/L 4.5 4.7 4.5  Chloride 96 - 112 mEq/L - - -  CO2 22 - 29 mEq/L 22 26 26   Calcium 8.4 - 10.4 mg/dL 8.9 9.5 10.0  Total Protein 6.4 - 8.3 g/dL 6.7 6.7 7.0  Total Bilirubin 0.20 - 1.20 mg/dL 0.97 1.06 1.10  Alkaline Phos 40 - 150 U/L 125 130 143  AST 5 - 34 U/L 29 29 41(H)  ALT 0 - 55 U/L 25 29 36   CEA 10/05/16 - 2.41   PATHOLOGY  Diagnosis 10/07/16 Surgical [P], rectal, polyp - TUBULOVILLOUS ADENOMA (SIX FRAGMENTS). - NO HIGH GRADE DYSPLASIA OR MALIGNANCY.  Diagnosis  09/23/16 Esophagus, biopsy, mid to distal esophageal mass - FOCAL INVASIVE ADENOCARCINOMA ASSOCIATED WITH EXTENSIVE HIGH GRADE GLANDULAR DYSPLASIA. - INTESTINAL METAPLASIA CONSISTENT WITH BACKGROUND BARRETT'S ESOPHAGUS. - SEE MICROSCOPIC DESCRIPTION Microscopic Comment There are fragments of squamous and glandular mucosa and the glandular mucosa shows extensive high grade glandular dysplasia associated with Barrett's-type metaplasia. One of the fragments also has focal invasive adenocarcinoma within the mucosal stroma. Called to Dr Celesta Aver office on 09/24/16. (JDP:kh 09/24/16) Claudette Laws MD   PROCEDURES  Flexible Sigmoidoscopy 10/07/16 IMPRESSION - The perianal exam findings include erythematous skin. - The digital rectal exam revealed a 2 cm (diameter) rubbery-textured and smooth rectal mass palpated 2 to 3 cm from the anal verge. The mass was non-circumferential and located predominantly at the right-posterior bowel  wall. - A 20 mm polyp was found in the rectum. The polyp was semi-sessile. Biopsies were taken  with a cold forceps for histology. Verification of patient identification for the specimen was done. Estimated blood loss was minimal. - The exam was otherwise without abnormality.  Upper Endoscopy by Dr. Carlean Purl 09/23/16 IMPRESSION - Malignant esophageal tumor was found in the middle third of the esophagus and in the lower third of the esophagus. - Z-line regular, 45 cm from the incisors. - Congestion and mosaic pattern mucosa in the cardia, gastric fundus and gastric body. - The examination was otherwise normal. - No specimens collected.   RADIOGRAPHIC STUDIES: I have personally reviewed the radiological images as listed and agreed with the findings in the report. Nm Pet Image Initial (pi) Skull Base To Thigh  Result Date: 10/19/2016 CLINICAL DATA:  Initial treatment strategy for esophageal adenocarcinoma. EXAM: NUCLEAR MEDICINE PET SKULL BASE TO THIGH TECHNIQUE: 7.9 mCi F-18 FDG was injected intravenously. Full-ring PET imaging was performed from the skull base to thigh after the radiotracer. CT data was obtained and used for attenuation correction and anatomic localization. FASTING BLOOD GLUCOSE:  Value: 89 mg/dl COMPARISON:  10/01/2016 CT chest, abdomen and pelvis. 07/31/2016 MRI abdomen. FINDINGS: NECK: No hypermetabolic lymph nodes in the neck. CHEST: Hypermetabolic circumferential 2.6 x 2.0 cm mass in the lower third of the thoracic esophagus with max SUV 10.3 (series 4/image 81). No enlarged or hypermetabolic axillary, mediastinal or hilar lymph nodes. Small to moderate dependent right pleural effusion. No pleural hypermetabolism or pleural nodularity. Subsegmental atelectasis in the right middle and right lower lobes. No acute consolidative airspace disease or lung masses. Peripheral left upper lobe 2 mm solid pulmonary nodule (series 8/image 19) is below PET resolution and stable since  06/25/2016. No additional significant pulmonary nodules. Left main and 3 vessel coronary atherosclerosis. Atherosclerotic nonaneurysmal thoracic aorta. No left pleural effusion. No pneumothorax. Mild gynecomastia, slightly asymmetric to the left. ABDOMEN/PELVIS: Intensely hypermetabolic solid 3.6 x 1.8 cm right lower rectal mass with max SUV 31.1 (series 4/image 180). Diffusely irregular liver surface and relative hypertrophy of the lateral segment left liver lobe, compatible with cirrhosis. No hypermetabolic liver masses. Vague hypodense 1.1 cm lateral segment left liver lobe lesion (series 4/image 95), correlating with the hypervascular lesion seen in this location on the recent contrast-enhanced CT study. No abnormal hypermetabolic activity within the pancreas, adrenal glands, or spleen. No hypermetabolic lymph nodes in the abdomen or pelvis. Bilateral simple renal cysts, largest 4.0 cm in the anterior upper left kidney. Mildly enlarged prostate with nonspecific internal prostatic calcifications. Marked diffuse colonic diverticulosis, most prominent in the sigmoid colon. Atherosclerotic abdominal aorta with stable 3.0 cm infrarenal abdominal aortic aneurysm. Stable small right inguinal hernia containing fat and small amount of trapped fluid. SKELETON: No focal hypermetabolic activity to suggest skeletal metastasis. IMPRESSION: 1. Hypermetabolic circumferential mass in the lower third of the thoracic esophagus, compatible with known esophageal malignancy. 2. Intensely hypermetabolic right lower rectal mass, compatible with primary rectal malignancy. 3. No evidence of hypermetabolic locoregional metastatic disease for either the lower esophageal or rectal malignancy. 4. No evidence of hypermetabolic distant metastatic disease. 5. Cirrhosis. No hypermetabolic liver masses. Low-attenuation 1.1 cm lateral segment left liver lobe mass, correlating with the hypervascular mass in this location on the recent IV  contrast-enhanced CT abdomen study, indeterminate for hepatocellular carcinoma. Recommend attention on follow-up MRI abdomen without and with IV contrast in 3 months. 6. Small to moderate dependent right pleural effusion. 7. Solitary 2 mm solid left upper lobe pulmonary nodule, below PET resolution, for which 3 month stability has been demonstrated, probably  benign. 8. Left main and 3 vessel coronary atherosclerosis. 9. Aortic Atherosclerosis (ICD10-I70.0). Stable infrarenal 3.0 cm Abdominal Aortic Aneurysm (ICD10-I71.9). Recommend follow-up aortic ultrasound in 3 years. This recommendation follows ACR consensus guidelines: White Paper of the ACR Incidental Findings Committee II on Vascular Findings. J Am Coll Radiol 2013; 10:789-794. 10. Marked colonic diverticulosis. Electronically Signed   By: Ilona Sorrel M.D.   On: 10/19/2016 13:17     CT CAP W Contrast 10/01/16  IMPRESSION:  Focal concentric wall thickening and enhancement of distal thoracic esophagus, consistent known primary esophageal carcinoma. No definite evidence of metastatic disease within the chest, abdomen, or pelvis.  Moderate right pleural effusion, mildly decreased in size since prior study.  3 cm enhancing polypoid soft tissue density along the right lateral rectal wall, highly suspicious for primary rectal carcinoma. Recommend correlation with rectal exam and colonoscopy.  Hepatic cirrhosis and findings of portal venous hypertension. Two small hypervascular masses in the right and left lobes, mildly increased and suspicious for multifocal hepatocellular carcinoma.  MR liver w wo contrast 07/31/2016 IMPRESSION: 1. Cirrhosis and portal venous hypertension with splenomegaly. 2. Arterial and portal venous phase focus of hyper enhancement in the high left hepatic lobe is likely a perfusion anomaly. Consider follow-up with pre and post contrast abdominal MRI at 3-6 months. 3. Otherwise, no liver lesion identified. Outside  ultrasound not submitted. 4.  Aortic Atherosclerosis (ICD10-I70.0). 5. Moderate right pleural effusion with small volume perihepatic ascites.  ASSESSMENT & PLAN:  Altonio Schwertner is a 83 y.o. male with a history of Anemia,CAD, Cirrhosis of Liver, HTN, Neuropathy, Osteoporosis, Pleural effusion and thyroid disease.  1. Adenocarcinoma of the esophagus, min-low esophagus, cTxN0M0 -Pathology results have been explained in great detail -His CT scan reviewed no evidence of distant metastasis, this is reviewed with patient. -PET scan shows no evidence of hypermetabolic locoregional metastatic disease for the esophageal malignancy -Treatment options have previously been discussed with the patient in great detail; he is not an optimal surgical candidate due to his advanced age and comorbidities and liver cirrhosis, he is not an appropriate candidate for intensive systemic chemotherapy such as FOLFOX due to his advanced age and comorbidities, especially his liver cirrhosis -He has begun concurrent chemoradiation with Xeloda 3 tablets twice a day Monday through Friday on 10/26/2016 -He is aware that chemoradiation alone will likely not cure him and that the treatment is palliative -He is aware that he will require close monitoring due to the presence of liver cirrhosis and possibly increased side effects of Xeloda  -Labs reviewed, platelets now recovered to 149K, otherwise normal; AST has also normalized to 29 (was previously 41), appears to fluctuate, overall stable, exam unremarkable, he is tolerating treatment well without obvious side effects.  -he will continue chemoradiation with Xeloda 3 tablets twice a day Monday through Friday. -Overall doing well clinically, given his age, I think that he continues to tolerate his planned course of treatment very well.  -Labs weekly while on treatment. -Follow-up 11/22/16.   2.  Rectal polyp  -PET scan shows intensely hypermetabolic right lower rectal mass,  compatible with malignancy -Previous rectal exam negative for masses; no blood -He underwent a flexible sigmoidoscopy on 10/07/2016 which showed a benign rectal polyp.   3.  Cryptogenic liver cirrhosis with ascites -followed by GI, Dr. Carlean Purl -He does have 2 indeterminate liver lesions, likely benign based on the MRI, he will be followed by Dr. Carlean Purl.   4. CAD, HTN  -followed by cardiology, Dr. Donnetta Hutching at [facility] -  on metoprolol, aldactone, lipitor; nitro PRN -BP 119/55 today, stable; patient is asymptomatic, will monitor  5. Anorexia and weight loss -He has lost 5lbs since beginning Xeloda. His appetite has continued to be suppressed.  -He is aware of this and I encouraged him to try and eat as much as he can tolerate, he will try.  -I also encouraged him to begin supplement with Boost or Ensure to help with this.   PLAN:  -Labs reviewed, adequate to continue concurrent chemoradiation with Xeloda 3 tablets twice a day Monday through Friday.  -Lab weekly while under treatment -Follow-up 11/22/2016  No orders of the defined types were placed in this encounter.  All questions were answered. The patient knows to call the clinic with any problems, questions or concerns.  I spent 15 minutes counseling the patient face to face. The total time spent in the appointment was 20 minutes and more than 50% was on counseling.  This document serves as a record of services personally performed by Truitt Merle, MD. It was created on her behalf by Reola Mosher, a trained medical scribe. The creation of this record is based on the scribe's personal observations and the provider's statements to them. This document has been checked and approved by the attending provider.  Truitt Merle  11/16/2016

## 2016-11-17 ENCOUNTER — Encounter: Payer: Self-pay | Admitting: Hematology

## 2016-11-17 ENCOUNTER — Ambulatory Visit
Admission: RE | Admit: 2016-11-17 | Discharge: 2016-11-17 | Disposition: A | Discharge status: Home / Self Care | Payer: Medicare Other | Source: Ambulatory Visit | Attending: Radiation Oncology | Admitting: Radiation Oncology

## 2016-11-17 ENCOUNTER — Telehealth: Payer: Self-pay | Admitting: Hematology

## 2016-11-17 DIAGNOSIS — C153 Malignant neoplasm of upper third of esophagus: Secondary | ICD-10-CM | POA: Diagnosis not present

## 2016-11-17 NOTE — Telephone Encounter (Signed)
Per 10/23 - no los at checkout °

## 2016-11-18 ENCOUNTER — Encounter: Payer: Self-pay | Admitting: Hematology

## 2016-11-18 ENCOUNTER — Ambulatory Visit
Admission: RE | Admit: 2016-11-18 | Discharge: 2016-11-18 | Disposition: A | Discharge status: Home / Self Care | Payer: Medicare Other | Source: Ambulatory Visit | Attending: Radiation Oncology | Admitting: Radiation Oncology

## 2016-11-18 DIAGNOSIS — C153 Malignant neoplasm of upper third of esophagus: Secondary | ICD-10-CM | POA: Diagnosis not present

## 2016-11-19 ENCOUNTER — Emergency Department (HOSPITAL_COMMUNITY): Payer: Medicare Other

## 2016-11-19 ENCOUNTER — Encounter (HOSPITAL_COMMUNITY): Payer: Self-pay | Admitting: Emergency Medicine

## 2016-11-19 ENCOUNTER — Inpatient Hospital Stay (HOSPITAL_COMMUNITY)
Admission: EM | Admit: 2016-11-19 | Discharge: 2016-11-25 | DRG: 375 | Disposition: E | Payer: Medicare Other | Attending: Internal Medicine | Admitting: Internal Medicine

## 2016-11-19 ENCOUNTER — Ambulatory Visit: Payer: Medicare Other

## 2016-11-19 DIAGNOSIS — Z886 Allergy status to analgesic agent status: Secondary | ICD-10-CM | POA: Diagnosis not present

## 2016-11-19 DIAGNOSIS — C155 Malignant neoplasm of lower third of esophagus: Secondary | ICD-10-CM | POA: Diagnosis not present

## 2016-11-19 DIAGNOSIS — E039 Hypothyroidism, unspecified: Secondary | ICD-10-CM | POA: Diagnosis present

## 2016-11-19 DIAGNOSIS — Z66 Do not resuscitate: Secondary | ICD-10-CM | POA: Diagnosis present

## 2016-11-19 DIAGNOSIS — N189 Chronic kidney disease, unspecified: Secondary | ICD-10-CM | POA: Diagnosis not present

## 2016-11-19 DIAGNOSIS — Z807 Family history of other malignant neoplasms of lymphoid, hematopoietic and related tissues: Secondary | ICD-10-CM

## 2016-11-19 DIAGNOSIS — Y842 Radiological procedure and radiotherapy as the cause of abnormal reaction of the patient, or of later complication, without mention of misadventure at the time of the procedure: Secondary | ICD-10-CM | POA: Diagnosis present

## 2016-11-19 DIAGNOSIS — Z9842 Cataract extraction status, left eye: Secondary | ICD-10-CM

## 2016-11-19 DIAGNOSIS — I129 Hypertensive chronic kidney disease with stage 1 through stage 4 chronic kidney disease, or unspecified chronic kidney disease: Secondary | ICD-10-CM | POA: Diagnosis present

## 2016-11-19 DIAGNOSIS — Z800 Family history of malignant neoplasm of digestive organs: Secondary | ICD-10-CM

## 2016-11-19 DIAGNOSIS — E860 Dehydration: Secondary | ICD-10-CM | POA: Diagnosis present

## 2016-11-19 DIAGNOSIS — T451X5A Adverse effect of antineoplastic and immunosuppressive drugs, initial encounter: Secondary | ICD-10-CM | POA: Diagnosis present

## 2016-11-19 DIAGNOSIS — Z8249 Family history of ischemic heart disease and other diseases of the circulatory system: Secondary | ICD-10-CM

## 2016-11-19 DIAGNOSIS — N281 Cyst of kidney, acquired: Secondary | ICD-10-CM | POA: Diagnosis present

## 2016-11-19 DIAGNOSIS — I10 Essential (primary) hypertension: Secondary | ICD-10-CM

## 2016-11-19 DIAGNOSIS — I2510 Atherosclerotic heart disease of native coronary artery without angina pectoris: Secondary | ICD-10-CM | POA: Diagnosis present

## 2016-11-19 DIAGNOSIS — M109 Gout, unspecified: Secondary | ICD-10-CM | POA: Diagnosis not present

## 2016-11-19 DIAGNOSIS — J9811 Atelectasis: Secondary | ICD-10-CM | POA: Diagnosis not present

## 2016-11-19 DIAGNOSIS — J439 Emphysema, unspecified: Secondary | ICD-10-CM | POA: Diagnosis not present

## 2016-11-19 DIAGNOSIS — R188 Other ascites: Secondary | ICD-10-CM | POA: Diagnosis not present

## 2016-11-19 DIAGNOSIS — R109 Unspecified abdominal pain: Secondary | ICD-10-CM | POA: Diagnosis not present

## 2016-11-19 DIAGNOSIS — Z79890 Hormone replacement therapy (postmenopausal): Secondary | ICD-10-CM

## 2016-11-19 DIAGNOSIS — F329 Major depressive disorder, single episode, unspecified: Secondary | ICD-10-CM | POA: Diagnosis present

## 2016-11-19 DIAGNOSIS — I700 Atherosclerosis of aorta: Secondary | ICD-10-CM | POA: Diagnosis present

## 2016-11-19 DIAGNOSIS — J90 Pleural effusion, not elsewhere classified: Secondary | ICD-10-CM | POA: Diagnosis not present

## 2016-11-19 DIAGNOSIS — K7469 Other cirrhosis of liver: Secondary | ICD-10-CM | POA: Diagnosis not present

## 2016-11-19 DIAGNOSIS — N179 Acute kidney failure, unspecified: Secondary | ICD-10-CM | POA: Diagnosis present

## 2016-11-19 DIAGNOSIS — Z7983 Long term (current) use of bisphosphonates: Secondary | ICD-10-CM

## 2016-11-19 DIAGNOSIS — K922 Gastrointestinal hemorrhage, unspecified: Secondary | ICD-10-CM | POA: Diagnosis present

## 2016-11-19 DIAGNOSIS — Z9841 Cataract extraction status, right eye: Secondary | ICD-10-CM

## 2016-11-19 DIAGNOSIS — G629 Polyneuropathy, unspecified: Secondary | ICD-10-CM | POA: Diagnosis not present

## 2016-11-19 DIAGNOSIS — Z823 Family history of stroke: Secondary | ICD-10-CM

## 2016-11-19 DIAGNOSIS — K5730 Diverticulosis of large intestine without perforation or abscess without bleeding: Secondary | ICD-10-CM | POA: Diagnosis present

## 2016-11-19 DIAGNOSIS — Z806 Family history of leukemia: Secondary | ICD-10-CM

## 2016-11-19 DIAGNOSIS — D128 Benign neoplasm of rectum: Secondary | ICD-10-CM | POA: Diagnosis present

## 2016-11-19 DIAGNOSIS — K219 Gastro-esophageal reflux disease without esophagitis: Secondary | ICD-10-CM | POA: Diagnosis present

## 2016-11-19 DIAGNOSIS — I714 Abdominal aortic aneurysm, without rupture: Secondary | ICD-10-CM | POA: Diagnosis present

## 2016-11-19 DIAGNOSIS — R112 Nausea with vomiting, unspecified: Secondary | ICD-10-CM | POA: Diagnosis present

## 2016-11-19 DIAGNOSIS — M810 Age-related osteoporosis without current pathological fracture: Secondary | ICD-10-CM | POA: Diagnosis present

## 2016-11-19 DIAGNOSIS — N183 Chronic kidney disease, stage 3 (moderate): Secondary | ICD-10-CM | POA: Diagnosis present

## 2016-11-19 DIAGNOSIS — E785 Hyperlipidemia, unspecified: Secondary | ICD-10-CM | POA: Diagnosis present

## 2016-11-19 DIAGNOSIS — E7800 Pure hypercholesterolemia, unspecified: Secondary | ICD-10-CM | POA: Diagnosis present

## 2016-11-19 DIAGNOSIS — R110 Nausea: Secondary | ICD-10-CM

## 2016-11-19 HISTORY — DX: Hypothyroidism, unspecified: E03.9

## 2016-11-19 LAB — LACTIC ACID, PLASMA
LACTIC ACID, VENOUS: 2.1 mmol/L — AB (ref 0.5–1.9)
Lactic Acid, Venous: 1.7 mmol/L (ref 0.5–1.9)

## 2016-11-19 LAB — I-STAT CG4 LACTIC ACID, ED
LACTIC ACID, VENOUS: 4.05 mmol/L — AB (ref 0.5–1.9)
Lactic Acid, Venous: 3.26 mmol/L (ref 0.5–1.9)

## 2016-11-19 LAB — URINALYSIS, ROUTINE W REFLEX MICROSCOPIC
Bilirubin Urine: NEGATIVE
Glucose, UA: NEGATIVE mg/dL
Hgb urine dipstick: NEGATIVE
KETONES UR: 5 mg/dL — AB
LEUKOCYTES UA: NEGATIVE
NITRITE: NEGATIVE
PROTEIN: NEGATIVE mg/dL
Specific Gravity, Urine: 1.033 — ABNORMAL HIGH (ref 1.005–1.030)
pH: 5 (ref 5.0–8.0)

## 2016-11-19 LAB — COMPREHENSIVE METABOLIC PANEL
ALBUMIN: 3.4 g/dL — AB (ref 3.5–5.0)
ALT: 31 U/L (ref 17–63)
AST: 37 U/L (ref 15–41)
Alkaline Phosphatase: 105 U/L (ref 38–126)
Anion gap: 18 — ABNORMAL HIGH (ref 5–15)
BUN: 63 mg/dL — AB (ref 6–20)
CHLORIDE: 96 mmol/L — AB (ref 101–111)
CO2: 20 mmol/L — AB (ref 22–32)
CREATININE: 1.58 mg/dL — AB (ref 0.61–1.24)
Calcium: 9.6 mg/dL (ref 8.9–10.3)
GFR calc non Af Amer: 37 mL/min — ABNORMAL LOW (ref >60)
GFR, EST AFRICAN AMERICAN: 43 mL/min — AB (ref >60)
GLUCOSE: 165 mg/dL — AB (ref 65–99)
POTASSIUM: 3.4 mmol/L — AB (ref 3.5–5.1)
SODIUM: 134 mmol/L — AB (ref 135–145)
Total Bilirubin: 2 mg/dL — ABNORMAL HIGH (ref 0.3–1.2)
Total Protein: 6.6 g/dL (ref 6.5–8.1)

## 2016-11-19 LAB — CBC WITH DIFFERENTIAL/PLATELET
BASOS PCT: 0 %
Basophils Absolute: 0 10*3/uL (ref 0.0–0.1)
EOS ABS: 0 10*3/uL (ref 0.0–0.7)
EOS PCT: 1 %
HCT: 42.2 % (ref 39.0–52.0)
Hemoglobin: 15.5 g/dL (ref 13.0–17.0)
LYMPHS ABS: 0.3 10*3/uL — AB (ref 0.7–4.0)
Lymphocytes Relative: 5 %
MCH: 33.9 pg (ref 26.0–34.0)
MCHC: 36.7 g/dL — AB (ref 30.0–36.0)
MCV: 92.3 fL (ref 78.0–100.0)
Monocytes Absolute: 0.1 10*3/uL (ref 0.1–1.0)
Monocytes Relative: 2 %
NEUTROS PCT: 92 %
Neutro Abs: 5.7 10*3/uL (ref 1.7–7.7)
PLATELETS: 154 10*3/uL (ref 150–400)
RBC: 4.57 MIL/uL (ref 4.22–5.81)
RDW: 14.6 % (ref 11.5–15.5)
WBC: 6.2 10*3/uL (ref 4.0–10.5)

## 2016-11-19 LAB — LIPASE, BLOOD: Lipase: 34 U/L (ref 11–51)

## 2016-11-19 MED ORDER — ALLOPURINOL 100 MG PO TABS
100.0000 mg | ORAL_TABLET | Freq: Every day | ORAL | Status: DC
  Administered 2016-11-19: 100 mg via ORAL
  Filled 2016-11-19: qty 1

## 2016-11-19 MED ORDER — LEVOTHYROXINE SODIUM 75 MCG PO TABS
75.0000 ug | ORAL_TABLET | Freq: Every day | ORAL | Status: DC
  Administered 2016-11-19: 75 ug via ORAL
  Filled 2016-11-19: qty 3
  Filled 2016-11-19: qty 1
  Filled 2016-11-19: qty 3
  Filled 2016-11-19: qty 1
  Filled 2016-11-19: qty 3

## 2016-11-19 MED ORDER — SODIUM CHLORIDE 0.9 % IV SOLN
INTRAVENOUS | Status: DC
  Administered 2016-11-19 – 2016-11-20 (×3): via INTRAVENOUS

## 2016-11-19 MED ORDER — LOPERAMIDE HCL 2 MG PO CAPS
2.0000 mg | ORAL_CAPSULE | Freq: Four times a day (QID) | ORAL | Status: DC | PRN
  Filled 2016-11-19: qty 1

## 2016-11-19 MED ORDER — METOPROLOL TARTRATE 25 MG PO TABS
25.0000 mg | ORAL_TABLET | Freq: Every day | ORAL | Status: DC
  Administered 2016-11-19: 25 mg via ORAL
  Filled 2016-11-19: qty 1

## 2016-11-19 MED ORDER — ONDANSETRON HCL 4 MG/2ML IJ SOLN
4.0000 mg | Freq: Once | INTRAMUSCULAR | Status: AC
  Administered 2016-11-19: 4 mg via INTRAVENOUS
  Filled 2016-11-19: qty 2

## 2016-11-19 MED ORDER — MORPHINE SULFATE (PF) 4 MG/ML IV SOLN
4.0000 mg | Freq: Once | INTRAVENOUS | Status: AC
  Administered 2016-11-19: 4 mg via INTRAVENOUS
  Filled 2016-11-19: qty 1

## 2016-11-19 MED ORDER — TRAZODONE HCL 50 MG PO TABS: 25.0000 mg | ORAL_TABLET | Freq: Every evening | ORAL | Status: DC | PRN

## 2016-11-19 MED ORDER — ACETAMINOPHEN 650 MG RE SUPP: 650.0000 mg | Freq: Four times a day (QID) | RECTAL | Status: DC | PRN

## 2016-11-19 MED ORDER — MORPHINE SULFATE (PF) 2 MG/ML IV SOLN
2.0000 mg | Freq: Once | INTRAVENOUS | Status: AC
  Administered 2016-11-19: 2 mg via INTRAVENOUS
  Filled 2016-11-19: qty 1

## 2016-11-19 MED ORDER — MORPHINE SULFATE (PF) 2 MG/ML IV SOLN
2.0000 mg | INTRAVENOUS | Status: DC | PRN
Start: 2016-11-19 — End: 2016-11-19

## 2016-11-19 MED ORDER — ACETAMINOPHEN 325 MG PO TABS: 650.0000 mg | ORAL_TABLET | Freq: Four times a day (QID) | ORAL | Status: DC | PRN

## 2016-11-19 MED ORDER — ENOXAPARIN SODIUM 40 MG/0.4ML ~~LOC~~ SOLN
40.0000 mg | SUBCUTANEOUS | Status: DC
  Administered 2016-11-19 – 2016-11-20 (×2): 40 mg via SUBCUTANEOUS
  Filled 2016-11-19 (×2): qty 0.4

## 2016-11-19 MED ORDER — IOPAMIDOL (ISOVUE-300) INJECTION 61%
INTRAVENOUS | Status: AC
  Administered 2016-11-19: 50 mL
  Filled 2016-11-19: qty 100

## 2016-11-19 MED ORDER — SODIUM CHLORIDE 0.9 % IV BOLUS (SEPSIS)
1000.0000 mL | Freq: Once | INTRAVENOUS | Status: AC
  Administered 2016-11-19: 1000 mL via INTRAVENOUS

## 2016-11-19 MED ORDER — HYDRALAZINE HCL 20 MG/ML IJ SOLN: 10.0000 mg | Freq: Three times a day (TID) | INTRAMUSCULAR | Status: DC | PRN

## 2016-11-19 MED ORDER — MORPHINE SULFATE (PF) 4 MG/ML IV SOLN
2.0000 mg | INTRAVENOUS | Status: DC | PRN
  Administered 2016-11-20 – 2016-11-21 (×3): 2 mg via INTRAVENOUS
  Filled 2016-11-19 (×3): qty 1

## 2016-11-19 MED ORDER — NITROGLYCERIN 0.4 MG SL SUBL: 0.4000 mg | SUBLINGUAL_TABLET | SUBLINGUAL | Status: DC | PRN

## 2016-11-19 MED ORDER — SPIRONOLACTONE 100 MG PO TABS
50.0000 mg | ORAL_TABLET | Freq: Every day | ORAL | Status: DC
Start: 2016-11-19 — End: 2016-11-20
  Administered 2016-11-19: 50 mg via ORAL
  Filled 2016-11-19 (×2): qty 1

## 2016-11-19 MED ORDER — ONDANSETRON HCL 4 MG/2ML IJ SOLN
4.0000 mg | Freq: Four times a day (QID) | INTRAMUSCULAR | Status: DC | PRN
  Administered 2016-11-19 – 2016-11-20 (×2): 4 mg via INTRAVENOUS
  Filled 2016-11-19 (×2): qty 2

## 2016-11-19 MED ORDER — SIMETHICONE 80 MG PO CHEW
80.0000 mg | CHEWABLE_TABLET | Freq: Four times a day (QID) | ORAL | Status: DC | PRN
  Administered 2016-11-19 (×2): 80 mg via ORAL
  Filled 2016-11-19 (×2): qty 1

## 2016-11-19 NOTE — ED Provider Notes (Signed)
TIME SEEN: 5:32 AM  CHIEF COMPLAINT: Abdominal pain  HPI: Patient is an 83 year old male with history of adenocarcinoma of the esophagus currently undergoing radiation treatment to 5 days a week, hypertension, hyperlipidemia who presents to the emergency department with his wife with complaints of upper abdominal pain that has been present for the past couple of days and diarrhea that is progressively worsening.  He has had nausea but no vomiting.  No fevers or chills.  No chest pain or shortness of breath.  History of previous hernia repair and drainage of a perirectal abscess.  ROS: See HPI Constitutional: no fever  Eyes: no drainage  ENT: no runny nose   Cardiovascular:  no chest pain  Resp: no SOB  GI: no vomiting GU: no dysuria Integumentary: no rash  Allergy: no hives  Musculoskeletal: no leg swelling  Neurological: no slurred speech ROS otherwise negative  PAST MEDICAL HISTORY/PAST SURGICAL HISTORY:  Past Medical History:  Diagnosis Date  . Adenocarcinoma of esophagus ([place]) 09/24/2016  . Anemia 12/28/2015  . CAD (coronary artery disease)   . Chicken pox 83 yrs old  . Cirrhosis of liver ([place]) 06/29/2016  . Decreased breath sounds 05/27/2016  . Gout 04/24/2015  . High cholesterol   . Hypertension   . Measles as a child  . Medicare annual wellness visit, subsequent 10/31/2012   Living will wife and daughter have a copy, does not want heroic measures Sees cardiology his recent cardiologist is retiring his new doctor will be at [employer]   . Neuropathy   . Osteoporosis 12/01/2015  . Pleural effusion on right 08/01/2016  . Preventative health care 10/31/2012  . Thyroid disease     MEDICATIONS:  Prior to Admission medications   Medication Sig Start Date End Date Taking? Authorizing Provider  alendronate (FOSAMAX) 70 MG tablet TAKE 1 TABLET BY MOUTH ONCE PER WEEK, TAKE WITH FULL GLASS OF WATER ON EMPTY STOMACH 10/27/16   [provider]  allopurinol (ZYLOPRIM) 100 MG tablet  TAKE 1 TABLET (100 MG TOTAL) BY MOUTH DAILY. 06/22/16   Mosie Lukes, MD  atorvastatin (LIPITOR) 10 MG tablet TAKE 1 TABLET BY MOUTH AT BEDTIME 11/05/16   Mosie Lukes, MD  b complex vitamins tablet Take 1 tablet by mouth daily.    [provider]  Calcium Carb-Cholecalciferol (CALCIUM 600 + D PO) Take 600 mg by mouth 2 (two) times daily. Pt takes 1/2 pill twice daily.    [provider]  cholecalciferol (VITAMIN D) 1000 units tablet Take 1,000 Units by mouth daily.    [provider]  colestipol (MICRONIZED COLESTIPOL HCL) 1 g tablet Take 1 tablet (1 g total) by mouth 2 (two) times daily. 09/06/16   Mosie Lukes, MD  emollient (BIAFINE) cream Apply 1 application topically daily. Apply to chest and abdomen after rad txs when skin is red or irritation occurs daily and prn    Hayden Pedro, PA-C  levothyroxine (SYNTHROID, LEVOTHROID) 75 MCG tablet TAKE 1 TABLET BY MOUTH EVERY DAY 11/05/16   Mosie Lukes, MD  metoprolol tartrate (LOPRESSOR) 25 MG tablet Take 12.5 mg by mouth daily.     [provider]  Multiple Vitamins-Minerals (MULTIVITAMIN ADULT PO) Take 1 tablet by mouth 2 (two) times a week.    [provider]  nitroGLYCERIN (NITROSTAT) 0.4 MG SL tablet Place 0.4 mg under the tongue every 5 (five) minutes as needed. For chest pain    [provider]  ranitidine (ZANTAC) 150 MG  capsule Take 1 capsule (150 mg total) by mouth every evening. For acid protection 12/12/15   Mosie Lukes, MD  spironolactone (ALDACTONE) 50 MG tablet Take 1 tablet (50 mg total) by mouth daily. 09/20/16   Gatha Mayer, MD  XELODA 500 MG tablet 1,500 mg. Take 1500 mg by mouth every 12 hours Monday thru Friday on radiation days. 11/11/16   [provider]    ALLERGIES:  Allergies  Allergen Reactions  . Aspirin Swelling    SOCIAL HISTORY:  Social History  Substance Use Topics  . Smoking status: Never Smoker  . Smokeless tobacco:  Never Used  . Alcohol use No     Comment: Used to have 2 shots of bourbon daily for many years but stopped summer 2018    FAMILY HISTORY: Family History  Problem Relation Age of Onset  . Stroke Mother   . Hypertension Mother   . Cancer Maternal Grandmother        gastric cancer   . Heart disease Father   . Cancer Daughter 69       lymphoma   . Cancer Cousin        leukemia   . Breast cancer Neg Hx   . Diabetes Neg Hx   . Colon cancer Neg Hx   . Prostate cancer Neg Hx     EXAM: BP 139/81 (BP Location: Left Arm)   Pulse 97   Temp (!) 97.4 F (36.3 C) (Oral)   Resp 18   Ht 5\' 8"  (1.727 m)   Wt 65.8 kg (145 lb)   SpO2 100%   BMI 22.05 kg/m  CONSTITUTIONAL: Alert and oriented and responds appropriately to questions.  Elderly, nontoxic, well-hydrated, appears uncomfortable HEAD: Normocephalic EYES: Conjunctivae clear, pupils appear equal, EOMI ENT: normal nose; moist mucous membranes NECK: Supple, no meningismus, no nuchal rigidity, no LAD  CARD: RRR; S1 and S2 appreciated; no murmurs, no clicks, no rubs, no gallops RESP: Normal chest excursion without splinting or tachypnea; breath sounds clear and equal bilaterally; no wheezes, no rhonchi, no rales, no hypoxia or respiratory distress, speaking full sentences ABD/GI: Normal bowel sounds; non-distended; soft, tender throughout the mid abdomen with voluntary guarding, no other peritoneal signs, no hepatosplenomegaly, no skin changes noted to the abdomen.  No redness, warmth or skin breakdown. BACK:  The back appears normal and is non-tender to palpation, there is no CVA tenderness EXT: Normal ROM in all joints; non-tender to palpation; no edema; normal capillary refill; no cyanosis, no calf tenderness or swelling    SKIN: Normal color for age and race; warm; no rash NEURO: Moves all extremities equally PSYCH: The patient's mood and manner are appropriate. Grooming and personal hygiene are appropriate.  MEDICAL DECISION  MAKING: Patient here with abdominal pain.  Differential diagnosis includes gastritis, colitis, diverticulitis, appendicitis 3 weeks, cholelithiasis, pancreatitis, radiation changes.  No skin changes noted to the abdomen.  Will give IV fluids, pain and nausea medicine.  Will obtain labs, urine and a CT of his abdomen and pelvis.  ED PROGRESS: Patient's lactate is 4.05.  Will obtain rectal temperature.  6:50 AM Rectal temperature 98.1.  No leukocytosis.  Has history of chronic kidney disease.  Creatinine mildly above baseline.  He is receiving IV fluids.  I suspect lactate is unlikely from sepsis given no fever, no leukocytosis, otherwise normal VS.  May be from dehydration, radiation changes.  We will continue to hydrate patient gently.  Urine, CT scan pending.  Chest x-ray shows stable  small right pleural effusion or acute abnormality.  CTAP and urine pending.  Signed out to Dr. Ralene Bathe.  I reviewed all nursing notes, vitals, pertinent previous records, EKGs, lab and urine results, imaging (as available).    Channon Brougher, Delice Bison, DO November 30, 2016 2302

## 2016-11-19 NOTE — ED Provider Notes (Signed)
Pt visit shared.  Pt with esophageal cancer undergoing radiation treatments.  He is here with abdominal pain and vomiting.  Lactic is elevated, not consistent with sepsis.  Lactic acidosis secondary to liver disease and dehydration.  He does have recurrent pain and nausea in the department, will retreat and provide additional IV fluids.  Plan to admit for observation for symptom management.    Quintella Reichert, MD 11/05/2016 1911

## 2016-11-19 NOTE — ED Notes (Signed)
RN AND MD NOTIFIED OF PATIENT'S LACTIC ACID LEVEL 3.26

## 2016-11-19 NOTE — ED Notes (Signed)
Pt. Made aware for the need of urine specimen. 

## 2016-11-19 NOTE — H&P (Signed)
[facility] History and Physical  Fernando Liu TOI:[id number] DOB: 14-Aug-1926 DOA: 10/30/2016  Referring physician:  PCP: Mosie Lukes, MD   Chief Complaint: "My stomach was just hurting so much."  HPI: Fernando Liu is a 83 y.o. male with past medical history significant for esophageal cancer undergoing chemoradiation, liver cirrhosis, gout, low thyroid and neuropathy who presents emergency room with chief complaint of abdominal pain.  Patient presents with lower abdominal pain, nausea, vomiting and diarrhea.  Denies any sick contacts.  States that this is a side effect of his cancer treatments.  Patient denies fever chills.  Describes abdominal pain as a ache that is very intense.  ED course: Patient given 2 L of fluid and morphine for abdominal pain.  Patient feels better.  CT abdomen pelvis done that showed no acute bleeding source or sign of infection.  Hospitalist consulted for admission.   Review of Systems:  As per HPI otherwise 10 point review of systems negative.    Past Medical History:  Diagnosis Date  . Adenocarcinoma of esophagus ([place]) 09/24/2016  . Anemia 12/28/2015  . CAD (coronary artery disease)   . Chicken pox 83 yrs old  . Cirrhosis of liver ([place]) 06/29/2016  . Decreased breath sounds 05/27/2016  . Gout 04/24/2015  . High cholesterol   . Hypertension   . Hypothyroid   . Measles as a child  . Medicare annual wellness visit, subsequent 10/31/2012   Living will wife and daughter have a copy, does not want heroic measures Sees cardiology his recent cardiologist is retiring his new doctor will be at [employer]   . Neuropathy   . Osteoporosis 12/01/2015  . Pleural effusion on right 08/01/2016  . Preventative health care 10/31/2012   Past Surgical History:  Procedure Laterality Date  . CATARACT EXTRACTION, BILATERAL     August & November 2012  . ESOPHAGOGASTRODUODENOSCOPY  08/2016   Esophageal cancer  . FOOT SURGERY Left   . HERNIA REPAIR    . PELVIC ABCESS  DRAINAGE     states surgery affected control of BM  . TONSILLECTOMY     Social History:  reports that he has never smoked. He has never used smokeless tobacco. He reports that he does not drink alcohol or use drugs.  Allergies  Allergen Reactions  . Aspirin Swelling    Family History  Problem Relation Age of Onset  . Stroke Mother   . Hypertension Mother   . Cancer Maternal Grandmother        gastric cancer   . Heart disease Father   . Cancer Daughter 79       lymphoma   . Cancer Cousin        leukemia   . Breast cancer Neg Hx   . Diabetes Neg Hx   . Colon cancer Neg Hx   . Prostate cancer Neg Hx      Prior to Admission medications   Medication Sig Start Date End Date Taking? Authorizing Provider  alendronate (FOSAMAX) 70 MG tablet TAKE 1 TABLET BY MOUTH ONCE PER WEEK, TAKE WITH FULL GLASS OF WATER ON EMPTY STOMACH 10/27/16  Yes [provider]  allopurinol (ZYLOPRIM) 100 MG tablet TAKE 1 TABLET (100 MG TOTAL) BY MOUTH DAILY. 06/22/16  Yes Mosie Lukes, MD  atorvastatin (LIPITOR) 10 MG tablet TAKE 1 TABLET BY MOUTH AT BEDTIME 11/05/16  Yes Mosie Lukes, MD  b complex vitamins tablet Take 1 tablet by mouth every evening.    Yes [provider]  Calcium Carb-Cholecalciferol (CALCIUM 600 + D PO) Take 600 mg by mouth 2 (two) times daily.    Yes [provider]  cholecalciferol (VITAMIN D) 1000 units tablet Take 1,000 Units by mouth daily.   Yes [provider]  colestipol (MICRONIZED COLESTIPOL HCL) 1 g tablet Take 1 tablet (1 g total) by mouth 2 (two) times daily. Patient taking differently: Take 1 g by mouth 2 (two) times daily as needed (for diarrhea).  09/06/16  Yes Mosie Lukes, MD  emollient (BIAFINE) cream Apply 1 application topically daily. Apply to chest and abdomen after rad txs when skin is red or irritation occurs daily and prn   Yes Hayden Pedro, PA-C  levothyroxine (SYNTHROID, LEVOTHROID) 75 MCG tablet TAKE 1 TABLET  BY MOUTH EVERY DAY 11/05/16  Yes Mosie Lukes, MD  loperamide (IMODIUM A-D) 2 MG tablet Take 2 mg by mouth 4 (four) times daily as needed for diarrhea or loose stools.   Yes [provider]  metoprolol tartrate (LOPRESSOR) 25 MG tablet Take 25 mg by mouth daily.    Yes [provider]  Multiple Vitamins-Minerals (MULTIVITAMIN ADULT PO) Take 1 tablet by mouth 2 (two) times a week.   Yes [provider]  nitroGLYCERIN (NITROSTAT) 0.4 MG SL tablet Place 0.4 mg under the tongue every 5 (five) minutes as needed. For chest pain   Yes [provider]  ranitidine (ZANTAC) 150 MG capsule Take 1 capsule (150 mg total) by mouth every evening. For acid protection Patient taking differently: Take 150 mg by mouth daily as needed for heartburn.  12/12/15  Yes Mosie Lukes, MD  spironolactone (ALDACTONE) 50 MG tablet Take 1 tablet (50 mg total) by mouth daily. 09/20/16  Yes Gatha Mayer, MD  XELODA 500 MG tablet Take 1,500 mg by mouth See admin instructions. Take 1500 mg by mouth every 12 hours Monday thru Friday on radiation days.  11/11/16  Yes [provider]   Physical Exam: Vitals:   11/18/2016 0830 10/31/2016 0845 11/05/2016 1000 10/30/2016 1130  BP: (!) 134/59  (!) 133/59 122/68  Pulse: 86 88 80 68  Resp:      Temp:      TempSrc:      SpO2: 100% 99% 99% 98%  Weight:      Height:        Wt Readings from Last 3 Encounters:  11/24/2016 65.8 kg (145 lb)  11/16/16 66 kg (145 lb 8 oz)  11/02/16 66.8 kg (147 lb 3.2 oz)    General:  Appears calm and comfortable; A&Ox3; signs of wt loss Eyes:  PERRL, EOMI, normal lids, iris ENT:  grossly normal hearing, lips & tongue dry Neck:  no LAD, masses or thyromegaly Cardiovascular:  RRR, no m/r/g. No LE edema. Delayed cap refill. Respiratory:  CTA bilaterally, no w/r/r. Normal respiratory effort. Abdomen:  soft, lower abd tender, nd Skin:  no rash or induration seen on limited exam Musculoskeletal:  grossly normal  tone BUE/BLE Psychiatric:  grossly normal mood and affect, speech fluent and appropriate Neurologic:  CN 2-12 grossly intact, moves all extremities in coordinated fashion.          Labs on Admission:  Basic Metabolic Panel:  Recent Labs Lab 11/16/16 0937 11/01/2016 0544  NA 134* 134*  K 4.5 3.4*  CL  --  96*  CO2 22 20*  GLUCOSE 119 165*  BUN 42.3* 63*  CREATININE 1.3 1.58*  CALCIUM 8.9 9.6   Liver  Function Tests:  Recent Labs Lab 11/16/16 0937 10/25/2016 0544  AST 29 37  ALT 25 31  ALKPHOS 125 105  BILITOT 0.97 2.0*  PROT 6.7 6.6  ALBUMIN 3.3* 3.4*    Recent Labs Lab 11/08/2016 0544  LIPASE 34   No results for input(s): AMMONIA in the last 168 hours. CBC:  Recent Labs Lab 11/16/16 0937 11/02/2016 0544  WBC 5.8 6.2  NEUTROABS 4.7 5.7  HGB 14.8 15.5  HCT 43.5 42.2  MCV 98.8* 92.3  PLT 149 154   Cardiac Enzymes: No results for input(s): CKTOTAL, CKMB, CKMBINDEX, TROPONINI in the last 168 hours.  BNP (last 3 results) No results for input(s): BNP in the last 8760 hours.  ProBNP (last 3 results) No results for input(s): PROBNP in the last 8760 hours.   Serum creatinine: 1.58 mg/dL (H) 11/03/2016 0544 Estimated creatinine clearance: 30.1 mL/min (A)  CBG: No results for input(s): GLUCAP in the last 168 hours.  Radiological Exams on Admission: Dg Chest 2 View  Result Date: 10/31/2016 CLINICAL DATA:  Worsening abdominal pain with nausea and vomiting. Being treated for esophageal cancer. EXAM: CHEST  2 VIEW COMPARISON:  07/01/2016 FINDINGS: Heart size remains normal. Chronic aortic atherosclerosis. Left chest remains clear. Underlying emphysema. On the right, there is a small amount of pleural fluid layering dependently with mild atelectasis. IMPRESSION: Re- demonstration of small right effusion with mild right base atelectasis. No other acute finding. Aortic atherosclerosis. Electronically Signed   By: Nelson Chimes M.D.   On: 11/18/2016 07:11   Ct Abdomen  Pelvis W Contrast  Result Date: 11/16/2016 CLINICAL DATA:  83 year old male with history of abdominal pain, nausea, vomiting and diarrhea. Undergoing radiation therapy and chemotherapy for esophageal carcinoma. EXAM: CT ABDOMEN AND PELVIS WITH CONTRAST TECHNIQUE: Multidetector CT imaging of the abdomen and pelvis was performed using the standard protocol following bolus administration of intravenous contrast. CONTRAST:  50 mL of Isovue-300. COMPARISON:  PET-CT 10/19/2016. CT the chest, abdomen and pelvis 10/01/2016. FINDINGS: Lower chest: Aortic atherosclerosis. Atherosclerotic calcifications in the left anterior descending, left circumflex and right coronary arteries. Calcifications of the aortic valve. Thickening of the distal esophagus incompletely visualized, but corresponding to hypermetabolic activity on prior PET-CT, presumably related to residual mass. The more distal esophagus shortly above the gastroesophageal junction is patulous. Small to moderate right pleural effusion predominantly lying dependently, but with some anteriorly loculated fluid is well and fluid tracking in the major fissure. Some associated passive subsegmental atelectasis in the base of the right lung. Hepatobiliary: Liver has a shrunken appearance and nodular contour, indicative of cirrhosis. No cystic or solid hepatic lesions. No intra or extrahepatic biliary ductal dilatation. Gallbladder is normal in appearance. Pancreas: No pancreatic mass. No pancreatic ductal dilatation. No pancreatic or peripancreatic fluid or inflammatory changes. Spleen: Unremarkable. Adrenals/Urinary Tract: Multiple low-attenuation lesions in both kidneys, compatible with simple cysts, the largest of which is exophytic measuring 4.2 cm in the upper pole the left kidney. Several other subcentimeter low-attenuation lesions are noted in both kidneys, too small to characterize, but statistically likely to represent tiny cysts. No hydroureteronephrosis. Urinary  bladder is normal in appearance. Bilateral adrenal glands are normal in appearance. Stomach/Bowel: Stomach is distended and fluid-filled. No pathologic dilatation of small bowel or colon. Several colonic diverticulae are noted, without surrounding inflammatory changes to suggest an acute diverticulitis at this time. Normal appendix. Previously identified rectal mass is not confidently visualized on today's examination, as the distal rectum is completely collapsed. Vascular/Lymphatic: Extensive aortic atherosclerosis with  mild fusiform aneurysmal dilatation of the infrarenal abdominal aorta which measures up to 3.1 x 2.9 cm. No lymphadenopathy noted in the abdomen or pelvis. Reproductive: Prostate gland seminal vesicles are unremarkable in appearance. Other: No significant volume of ascites.  No pneumoperitoneum. Musculoskeletal: There are no aggressive appearing lytic or blastic lesions noted in the visualized portions of the skeleton. IMPRESSION: 1. Persistent mass-like thickening of the distal esophagus, compatible with the reported clinical history of esophageal malignancy. No definite metastatic disease identified in the abdomen or pelvis. 2. Previously identified the rectal mass is obscured on today's examination as the distal rectum is completely decompressed. 3. Small to moderate sized right pleural effusion which appears partially loculated, and is associated with areas of passive subsegmental atelectasis in the right lung base. 4. Stigmata of cirrhosis redemonstrated. No focal hepatic lesion identified at this time. 5. Colonic diverticulosis without evidence of acute diverticulitis at this time. 6. Aortic atherosclerosis, in addition to at least 3 vessel coronary artery disease. In addition, there is mild fusiform aneurysmal dilatation of the infrarenal abdominal aorta which has a mean diameter up to 3 cm. Recommend followup by ultrasound in 3 years. This recommendation follows ACR consensus guidelines:  White Paper of the ACR Incidental Findings Committee II on Vascular Findings. J Am Coll Radiol 2013; 10:789-794. 7. Additional incidental findings, as above. Aortic Atherosclerosis (ICD10-I70.0) and Aortic Aneurysm NOS (ICD10-I71.9). Electronically Signed   By: Vinnie Langton M.D.   On: 11/20/2016 08:21    EKG: pending  Assessment/Plan Principal Problem:   Dehydration Active Problems:   Abdominal pain   Acute on chronic renal failure (HCC)  Dehdyration 2/2 chemo Cont IVF Diet ordered Prn zofran Cont imodium UA clean CXR clean CT abd pelvis w/o acute abn  Abd pain Prn morphine  AKI/CKD Baseline Cr 1.5, Cr on admit 1.58 2L of normal saline given in the emergency room Gentle hydration overnight Checking magnesium and phosphorus Likely due to GI losses  Depression No SI/HI Cont desyrel  CAD Prn ntg sl  Gout Continue allopurinol  Hyperlipidemia Continue statin  Low thyroid Continue Synthroid 75 mcg  Hypertension Continue Lopressor, Aldactone PRN hydralazine  Reflux Continue Zantac  Code Status: FC  DVT Prophylaxis: lovenox Family Communication: wife at bedside Disposition Plan: Pending Improvement  Status: medsurg, obs  Elwin Mocha, MD Family Medicine [facility] [email or web address] Password [email or web address]

## 2016-11-19 NOTE — ED Notes (Signed)
I have just given report to Josph Macho and will transsport shortly.

## 2016-11-19 NOTE — ED Triage Notes (Signed)
Pt reports having increasing abd pain along with nausea, vomiting, and diarrhea. Pt currently receiving radiation and oral chemo for esophageal cancer.

## 2016-11-19 NOTE — ED Notes (Signed)
Abnormal lab result MD Ward have been made aware

## 2016-11-20 ENCOUNTER — Observation Stay (HOSPITAL_COMMUNITY): Payer: Medicare Other

## 2016-11-20 DIAGNOSIS — N281 Cyst of kidney, acquired: Secondary | ICD-10-CM | POA: Diagnosis not present

## 2016-11-20 DIAGNOSIS — Y842 Radiological procedure and radiotherapy as the cause of abnormal reaction of the patient, or of later complication, without mention of misadventure at the time of the procedure: Secondary | ICD-10-CM | POA: Diagnosis present

## 2016-11-20 DIAGNOSIS — K7031 Alcoholic cirrhosis of liver with ascites: Secondary | ICD-10-CM | POA: Diagnosis not present

## 2016-11-20 DIAGNOSIS — K7469 Other cirrhosis of liver: Secondary | ICD-10-CM | POA: Diagnosis not present

## 2016-11-20 DIAGNOSIS — R112 Nausea with vomiting, unspecified: Secondary | ICD-10-CM | POA: Diagnosis not present

## 2016-11-20 DIAGNOSIS — J439 Emphysema, unspecified: Secondary | ICD-10-CM | POA: Diagnosis not present

## 2016-11-20 DIAGNOSIS — J90 Pleural effusion, not elsewhere classified: Secondary | ICD-10-CM | POA: Diagnosis not present

## 2016-11-20 DIAGNOSIS — J9811 Atelectasis: Secondary | ICD-10-CM | POA: Diagnosis not present

## 2016-11-20 DIAGNOSIS — K625 Hemorrhage of anus and rectum: Secondary | ICD-10-CM | POA: Diagnosis not present

## 2016-11-20 DIAGNOSIS — Z886 Allergy status to analgesic agent status: Secondary | ICD-10-CM | POA: Diagnosis not present

## 2016-11-20 DIAGNOSIS — M810 Age-related osteoporosis without current pathological fracture: Secondary | ICD-10-CM | POA: Diagnosis present

## 2016-11-20 DIAGNOSIS — T451X5A Adverse effect of antineoplastic and immunosuppressive drugs, initial encounter: Secondary | ICD-10-CM | POA: Diagnosis present

## 2016-11-20 DIAGNOSIS — E039 Hypothyroidism, unspecified: Secondary | ICD-10-CM | POA: Diagnosis present

## 2016-11-20 DIAGNOSIS — M109 Gout, unspecified: Secondary | ICD-10-CM | POA: Diagnosis present

## 2016-11-20 DIAGNOSIS — R109 Unspecified abdominal pain: Secondary | ICD-10-CM | POA: Diagnosis not present

## 2016-11-20 DIAGNOSIS — F329 Major depressive disorder, single episode, unspecified: Secondary | ICD-10-CM | POA: Diagnosis present

## 2016-11-20 DIAGNOSIS — C155 Malignant neoplasm of lower third of esophagus: Secondary | ICD-10-CM | POA: Diagnosis not present

## 2016-11-20 DIAGNOSIS — R188 Other ascites: Secondary | ICD-10-CM | POA: Diagnosis not present

## 2016-11-20 DIAGNOSIS — C159 Malignant neoplasm of esophagus, unspecified: Secondary | ICD-10-CM | POA: Diagnosis not present

## 2016-11-20 DIAGNOSIS — G629 Polyneuropathy, unspecified: Secondary | ICD-10-CM | POA: Diagnosis not present

## 2016-11-20 DIAGNOSIS — E860 Dehydration: Secondary | ICD-10-CM | POA: Diagnosis not present

## 2016-11-20 DIAGNOSIS — I2510 Atherosclerotic heart disease of native coronary artery without angina pectoris: Secondary | ICD-10-CM | POA: Diagnosis present

## 2016-11-20 DIAGNOSIS — I714 Abdominal aortic aneurysm, without rupture: Secondary | ICD-10-CM | POA: Diagnosis present

## 2016-11-20 DIAGNOSIS — E7800 Pure hypercholesterolemia, unspecified: Secondary | ICD-10-CM | POA: Diagnosis present

## 2016-11-20 DIAGNOSIS — N179 Acute kidney failure, unspecified: Secondary | ICD-10-CM | POA: Diagnosis not present

## 2016-11-20 DIAGNOSIS — I129 Hypertensive chronic kidney disease with stage 1 through stage 4 chronic kidney disease, or unspecified chronic kidney disease: Secondary | ICD-10-CM | POA: Diagnosis present

## 2016-11-20 DIAGNOSIS — E785 Hyperlipidemia, unspecified: Secondary | ICD-10-CM | POA: Diagnosis present

## 2016-11-20 DIAGNOSIS — Z66 Do not resuscitate: Secondary | ICD-10-CM | POA: Diagnosis present

## 2016-11-20 DIAGNOSIS — R140 Abdominal distension (gaseous): Secondary | ICD-10-CM | POA: Diagnosis not present

## 2016-11-20 DIAGNOSIS — K922 Gastrointestinal hemorrhage, unspecified: Secondary | ICD-10-CM | POA: Diagnosis not present

## 2016-11-20 DIAGNOSIS — R1033 Periumbilical pain: Secondary | ICD-10-CM | POA: Diagnosis not present

## 2016-11-20 LAB — CBC
HEMATOCRIT: 36.4 % — AB (ref 39.0–52.0)
HEMOGLOBIN: 12.8 g/dL — AB (ref 13.0–17.0)
MCH: 33.2 pg (ref 26.0–34.0)
MCHC: 35.2 g/dL (ref 30.0–36.0)
MCV: 94.5 fL (ref 78.0–100.0)
PLATELETS: 92 10*3/uL — AB (ref 150–400)
RBC: 3.85 MIL/uL — AB (ref 4.22–5.81)
RDW: 14.9 % (ref 11.5–15.5)
WBC: 1.4 10*3/uL — AB (ref 4.0–10.5)

## 2016-11-20 LAB — BASIC METABOLIC PANEL
ANION GAP: 9 (ref 5–15)
BUN: 54 mg/dL — AB (ref 6–20)
CHLORIDE: 111 mmol/L (ref 101–111)
CO2: 21 mmol/L — ABNORMAL LOW (ref 22–32)
Calcium: 8.2 mg/dL — ABNORMAL LOW (ref 8.9–10.3)
Creatinine, Ser: 1.3 mg/dL — ABNORMAL HIGH (ref 0.61–1.24)
GFR calc non Af Amer: 47 mL/min — ABNORMAL LOW (ref >60)
GFR, EST AFRICAN AMERICAN: 55 mL/min — AB (ref >60)
Glucose, Bld: 144 mg/dL — ABNORMAL HIGH (ref 65–99)
Potassium: 3.8 mmol/L (ref 3.5–5.1)
SODIUM: 141 mmol/L (ref 135–145)

## 2016-11-20 MED ORDER — PROMETHAZINE HCL 25 MG/ML IJ SOLN
12.5000 mg | Freq: Four times a day (QID) | INTRAMUSCULAR | Status: DC | PRN
  Administered 2016-11-20: 12.5 mg via INTRAVENOUS
  Filled 2016-11-20: qty 1

## 2016-11-20 MED ORDER — PROCHLORPERAZINE EDISYLATE 5 MG/ML IJ SOLN
10.0000 mg | Freq: Four times a day (QID) | INTRAMUSCULAR | Status: DC | PRN
  Administered 2016-11-20: 10 mg via INTRAVENOUS
  Filled 2016-11-20: qty 2

## 2016-11-20 MED ORDER — METHYLPREDNISOLONE SODIUM SUCC 125 MG IJ SOLR
60.0000 mg | Freq: Three times a day (TID) | INTRAMUSCULAR | Status: DC
  Administered 2016-11-20 – 2016-11-21 (×3): 60 mg via INTRAVENOUS
  Filled 2016-11-20 (×3): qty 2

## 2016-11-20 MED ORDER — SODIUM CHLORIDE 0.9 % IV SOLN
INTRAVENOUS | Status: DC
  Administered 2016-11-20 – 2016-11-21 (×2): via INTRAVENOUS

## 2016-11-20 NOTE — Progress Notes (Signed)
@IPLOG @        PROGRESS NOTE                                                                                                                                                                                                             Patient Demographics:    Fernando Liu, is a 83 y.o. male, DOB - May 27, 1926, IRJ:[id number]  Admit date - 11/23/2016   Admitting Physician Elwin Mocha, MD  Outpatient Primary MD for the patient is Mosie Lukes, MD  LOS - 0  Chief Complaint  Patient presents with  . Abdominal Pain       Brief Narrative - Fernando Liu is a 83 y.o. male with past medical history significant for esophageal cancer undergoing chemoradiation, liver cirrhosis, gout, low thyroid and neuropathy who presents emergency room with chief complaint of abdominal pain.  Patient presents with lower abdominal pain, nausea, vomiting and diarrhea.  Denies any sick contacts.  States that this is a side effect of his cancer treatments.  Patient denies fever chills.  Describes abdominal pain as a ache that is very intense.    Subjective:    Fernando Liu today has, No headache, No chest pain, No abdominal pain - positive Nausea, No new weakness tingling or numbness, No Cough - SOB.     Assessment  & Plan :     1. Nausea vomiting. The patient with esophageal cancer, undergoing chemotherapy and radiation. Being cared by Dr. Burr Medico and Lisbeth Renshaw. Likely nausea vomiting due to chemotherapy and radiation, supportive care, CT scan abdomen and pelvis nonacute, KUB shows some abdominal gas, on exam abdomen is slightly distended but nontender, patient is pain-free, is passing flatus, continue with supportive care, will give a trial of Solu-Medrol to reduce any inflammation. Currently nothing by mouth except medications, gentle IV fluids and antinausea medications.  2. Adenocarcinoma of the esophagus.focally invasive. Once stable from nausea and vomiting standpoint outpatient follow-up with Dr. Annamaria Boots  and moody.  3. Hypothyroidism. Continue supplementation.  4. History of gout. Stable  5. Incidental finding of 3 cm infrarenal AAA, liver cirrhosis. Outpatient follow-up with PCP.     Diet : Diet NPO time specified Except for: Sips with Meds    Family Communication  :   None  Code Status :  Full  Disposition Plan  :  TBD  Consults  :  None  Procedures  :    CT scan abdomen and pelvis - showing esophageal mass, 3 cm infrarenal AAA,possible hepatic cirrhosis  DVT Prophylaxis  :  Lovenox   Lab Results  Component Value Date   PLT 92 (L) 11/20/2016    Inpatient Medications  Scheduled Meds: . allopurinol  100 mg Oral Daily  . enoxaparin (LOVENOX) injection  40 mg Subcutaneous Q24H  . levothyroxine  75 mcg Oral QAC breakfast  . metoprolol tartrate  25 mg Oral Daily  . spironolactone  50 mg Oral Daily   Continuous Infusions: . sodium chloride     PRN Meds:.acetaminophen **OR** acetaminophen, hydrALAZINE, loperamide, morphine injection, nitroGLYCERIN, ondansetron, simethicone, traZODone  Antibiotics  :    Anti-infectives    None         Objective:   Vitals:   10/30/2016 1246 10/30/2016 1613 11/01/2016 2310 11/20/16 0535  BP: (!) 147/75 115/65 105/65 (!) 109/58  Pulse: 76 86 72 70  Resp: 18 18 16 20   Temp: (!) 97.5 F (36.4 C) (!) 97.5 F (36.4 C) 98.2 F (36.8 C) 98 F (36.7 C)  TempSrc: Oral Oral Oral Oral  SpO2: 100% 100% 98% 97%  Weight: 65.8 kg (145 lb)     Height: 5\' 8"  (1.727 m)       Wt Readings from Last 3 Encounters:  11/09/2016 65.8 kg (145 lb)  11/16/16 66 kg (145 lb 8 oz)  11/02/16 66.8 kg (147 lb 3.2 oz)     Intake/Output Summary (Last 24 hours) at 11/20/16 1223 Last data filed at 11/20/16 0303  Gross per 24 hour  Intake          1498.33 ml  Output              425 ml  Net          1073.33 ml     Physical Exam  Awake Alert, Oriented X 3, No new F.N deficits, Normal affect [place].AT,PERRAL Supple Neck,No JVD, No cervical  lymphadenopathy appriciated.  Symmetrical Chest wall movement, Good air movement bilaterally, CTAB RRR,No Gallops,Rubs or new Murmurs, No Parasternal Heave +ve B.Sounds, Abd Soft with minimal distension, No tenderness, No organomegaly appriciated, No rebound - guarding or rigidity. No Cyanosis, Clubbing or edema, No new Rash or bruise       Data Review:    CBC  Recent Labs Lab 11/16/16 0937 11/16/2016 0544 11/20/16 0402  WBC 5.8 6.2 1.4*  HGB 14.8 15.5 12.8*  HCT 43.5 42.2 36.4*  PLT 149 154 92*  MCV 98.8* 92.3 94.5  MCH 33.7* 33.9 33.2  MCHC 34.1 36.7* 35.2  RDW 13.3 14.6 14.9  LYMPHSABS 0.3* 0.3*  --   MONOABS 0.3 0.1  --   EOSABS 0.4 0.0  --   BASOSABS 0.0 0.0  --     Chemistries   Recent Labs Lab 11/16/16 0937 11/04/2016 0544 11/20/16 0402  NA 134* 134* 141  K 4.5 3.4* 3.8  CL  --  96* 111  CO2 22 20* 21*  GLUCOSE 119 165* 144*  BUN 42.3* 63* 54*  CREATININE 1.3 1.58* 1.30*  CALCIUM 8.9 9.6 8.2*  AST 29 37  --   ALT 25 31  --   ALKPHOS 125 105  --   BILITOT 0.97 2.0*  --    ------------------------------------------------------------------------------------------------------------------ No results for input(s): CHOL, HDL, LDLCALC, TRIG, CHOLHDL, LDLDIRECT in the last 72 hours.  Lab Results  Component Value Date   HGBA1C 5.2 05/07/2016   ------------------------------------------------------------------------------------------------------------------ No results for input(s): TSH, T4TOTAL, T3FREE, THYROIDAB in the last 72 hours.  Invalid input(s): FREET3 ------------------------------------------------------------------------------------------------------------------ No results for input(s): VITAMINB12, FOLATE, FERRITIN, TIBC, IRON, RETICCTPCT in  the last 72 hours.  Coagulation profile No results for input(s): INR, PROTIME in the last 168 hours.  No results for input(s): DDIMER in the last 72 hours.  Cardiac Enzymes No results for input(s): CKMB,  TROPONINI, MYOGLOBIN in the last 168 hours.  Invalid input(s): CK ------------------------------------------------------------------------------------------------------------------ No results found for: BNP  Micro Results No results found for this or any previous visit (from the past 240 hour(s)).  Radiology Reports Dg Chest 2 View  Result Date: 11/16/2016 CLINICAL DATA:  Worsening abdominal pain with nausea and vomiting. Being treated for esophageal cancer. EXAM: CHEST  2 VIEW COMPARISON:  07/01/2016 FINDINGS: Heart size remains normal. Chronic aortic atherosclerosis. Left chest remains clear. Underlying emphysema. On the right, there is a small amount of pleural fluid layering dependently with mild atelectasis. IMPRESSION: Re- demonstration of small right effusion with mild right base atelectasis. No other acute finding. Aortic atherosclerosis. Electronically Signed   By: Nelson Chimes M.D.   On: 11/13/2016 07:11   Ct Abdomen Pelvis W Contrast  Result Date: 11/07/2016 CLINICAL DATA:  83 year old male with history of abdominal pain, nausea, vomiting and diarrhea. Undergoing radiation therapy and chemotherapy for esophageal carcinoma. EXAM: CT ABDOMEN AND PELVIS WITH CONTRAST TECHNIQUE: Multidetector CT imaging of the abdomen and pelvis was performed using the standard protocol following bolus administration of intravenous contrast. CONTRAST:  50 mL of Isovue-300. COMPARISON:  PET-CT 10/19/2016. CT the chest, abdomen and pelvis 10/01/2016. FINDINGS: Lower chest: Aortic atherosclerosis. Atherosclerotic calcifications in the left anterior descending, left circumflex and right coronary arteries. Calcifications of the aortic valve. Thickening of the distal esophagus incompletely visualized, but corresponding to hypermetabolic activity on prior PET-CT, presumably related to residual mass. The more distal esophagus shortly above the gastroesophageal junction is patulous. Small to moderate right pleural  effusion predominantly lying dependently, but with some anteriorly loculated fluid is well and fluid tracking in the major fissure. Some associated passive subsegmental atelectasis in the base of the right lung. Hepatobiliary: Liver has a shrunken appearance and nodular contour, indicative of cirrhosis. No cystic or solid hepatic lesions. No intra or extrahepatic biliary ductal dilatation. Gallbladder is normal in appearance. Pancreas: No pancreatic mass. No pancreatic ductal dilatation. No pancreatic or peripancreatic fluid or inflammatory changes. Spleen: Unremarkable. Adrenals/Urinary Tract: Multiple low-attenuation lesions in both kidneys, compatible with simple cysts, the largest of which is exophytic measuring 4.2 cm in the upper pole the left kidney. Several other subcentimeter low-attenuation lesions are noted in both kidneys, too small to characterize, but statistically likely to represent tiny cysts. No hydroureteronephrosis. Urinary bladder is normal in appearance. Bilateral adrenal glands are normal in appearance. Stomach/Bowel: Stomach is distended and fluid-filled. No pathologic dilatation of small bowel or colon. Several colonic diverticulae are noted, without surrounding inflammatory changes to suggest an acute diverticulitis at this time. Normal appendix. Previously identified rectal mass is not confidently visualized on today's examination, as the distal rectum is completely collapsed. Vascular/Lymphatic: Extensive aortic atherosclerosis with mild fusiform aneurysmal dilatation of the infrarenal abdominal aorta which measures up to 3.1 x 2.9 cm. No lymphadenopathy noted in the abdomen or pelvis. Reproductive: Prostate gland seminal vesicles are unremarkable in appearance. Other: No significant volume of ascites.  No pneumoperitoneum. Musculoskeletal: There are no aggressive appearing lytic or blastic lesions noted in the visualized portions of the skeleton. IMPRESSION: 1. Persistent mass-like  thickening of the distal esophagus, compatible with the reported clinical history of esophageal malignancy. No definite metastatic disease identified in the abdomen or pelvis. 2. Previously identified the rectal  mass is obscured on today's examination as the distal rectum is completely decompressed. 3. Small to moderate sized right pleural effusion which appears partially loculated, and is associated with areas of passive subsegmental atelectasis in the right lung base. 4. Stigmata of cirrhosis redemonstrated. No focal hepatic lesion identified at this time. 5. Colonic diverticulosis without evidence of acute diverticulitis at this time. 6. Aortic atherosclerosis, in addition to at least 3 vessel coronary artery disease. In addition, there is mild fusiform aneurysmal dilatation of the infrarenal abdominal aorta which has a mean diameter up to 3 cm. Recommend followup by ultrasound in 3 years. This recommendation follows ACR consensus guidelines: White Paper of the ACR Incidental Findings Committee II on Vascular Findings. J Am Coll Radiol 2013; 10:789-794. 7. Additional incidental findings, as above. Aortic Atherosclerosis (ICD10-I70.0) and Aortic Aneurysm NOS (ICD10-I71.9). Electronically Signed   By: Vinnie Langton M.D.   On: 10/28/2016 08:21   Dg Abd Portable 1v  Result Date: 11/20/2016 CLINICAL DATA:  Nausea and vomiting. EXAM: PORTABLE ABDOMEN - 1 VIEW COMPARISON:  CT 11/02/2016 FINDINGS: Portable supine view of the abdomen demonstrates gaseous distension of the stomach. There is gas in the small and large bowel. Splenic artery is heavily calcified. Right pleural fluid is partially visualized. IMPRESSION: Gaseous distension of the stomach. Right pleural effusion. Electronically Signed   By: Markus Daft M.D.   On: 11/20/2016 08:43    Time Spent in minutes  30   Lala Lund M.D on 11/20/2016 at 12:23 PM  Between 7am to 7pm - Pager - [phone] ( page via [email or web address], text pages only, please  mention full 10 digit call back number). After 7pm go to [email or web address] - password [email or web address]

## 2016-11-20 NOTE — Progress Notes (Signed)
CRITICAL VALUE ALERT  Critical value: WBC 1.4   Date & Time Notified: 11/20/2016 at 0504 Provider Notified: X. Blount Orders Received/Actions Taken: NP/MD to assess and address.

## 2016-11-21 ENCOUNTER — Inpatient Hospital Stay (HOSPITAL_COMMUNITY): Payer: Medicare Other

## 2016-11-21 DIAGNOSIS — R112 Nausea with vomiting, unspecified: Secondary | ICD-10-CM

## 2016-11-21 DIAGNOSIS — C159 Malignant neoplasm of esophagus, unspecified: Secondary | ICD-10-CM

## 2016-11-21 DIAGNOSIS — K7031 Alcoholic cirrhosis of liver with ascites: Secondary | ICD-10-CM

## 2016-11-21 DIAGNOSIS — R1033 Periumbilical pain: Secondary | ICD-10-CM

## 2016-11-21 DIAGNOSIS — K625 Hemorrhage of anus and rectum: Secondary | ICD-10-CM

## 2016-11-21 LAB — CBC WITH DIFFERENTIAL/PLATELET
BASOS PCT: 0 %
Basophils Absolute: 0 10*3/uL (ref 0.0–0.1)
Eosinophils Absolute: 0 10*3/uL (ref 0.0–0.7)
Eosinophils Relative: 0 %
HCT: 39.4 % (ref 39.0–52.0)
HEMOGLOBIN: 13.9 g/dL (ref 13.0–17.0)
LYMPHS ABS: 0.1 10*3/uL — AB (ref 0.7–4.0)
LYMPHS PCT: 3 %
MCH: 33.3 pg (ref 26.0–34.0)
MCHC: 35.3 g/dL (ref 30.0–36.0)
MCV: 94.5 fL (ref 78.0–100.0)
MONOS PCT: 2 %
Monocytes Absolute: 0.1 10*3/uL (ref 0.1–1.0)
NEUTROS ABS: 3.8 10*3/uL (ref 1.7–7.7)
Neutrophils Relative %: 95 %
Platelets: 108 10*3/uL — ABNORMAL LOW (ref 150–400)
RBC: 4.17 MIL/uL — ABNORMAL LOW (ref 4.22–5.81)
RDW: 15.3 % (ref 11.5–15.5)
WBC MORPHOLOGY: INCREASED
WBC: 4 10*3/uL (ref 4.0–10.5)

## 2016-11-21 LAB — BASIC METABOLIC PANEL
ANION GAP: 8 (ref 5–15)
BUN: 61 mg/dL — ABNORMAL HIGH (ref 6–20)
CO2: 20 mmol/L — ABNORMAL LOW (ref 22–32)
CREATININE: 1.37 mg/dL — AB (ref 0.61–1.24)
Calcium: 8 mg/dL — ABNORMAL LOW (ref 8.9–10.3)
Chloride: 115 mmol/L — ABNORMAL HIGH (ref 101–111)
GFR calc non Af Amer: 44 mL/min — ABNORMAL LOW (ref >60)
GFR, EST AFRICAN AMERICAN: 51 mL/min — AB (ref >60)
Glucose, Bld: 176 mg/dL — ABNORMAL HIGH (ref 65–99)
POTASSIUM: 3.5 mmol/L (ref 3.5–5.1)
SODIUM: 143 mmol/L (ref 135–145)

## 2016-11-21 LAB — PROTIME-INR
INR: 1.32
PROTHROMBIN TIME: 16.3 s — AB (ref 11.4–15.2)

## 2016-11-21 LAB — TYPE AND SCREEN
ABO/RH(D): O POS
ANTIBODY SCREEN: NEGATIVE

## 2016-11-21 LAB — HEMOGLOBIN AND HEMATOCRIT, BLOOD
HCT: 42.9 % (ref 39.0–52.0)
HEMATOCRIT: 41.5 % (ref 39.0–52.0)
HEMOGLOBIN: 14.5 g/dL (ref 13.0–17.0)
HEMOGLOBIN: 15 g/dL (ref 13.0–17.0)

## 2016-11-21 MED ORDER — PANTOPRAZOLE SODIUM 40 MG IV SOLR: 40.0000 mg | Freq: Two times a day (BID) | INTRAVENOUS | Status: DC

## 2016-11-21 MED ORDER — LEVOTHYROXINE SODIUM 100 MCG IV SOLR
37.5000 ug | Freq: Every day | INTRAVENOUS | Status: DC
  Administered 2016-11-21: 37.5 ug via INTRAVENOUS
  Filled 2016-11-21: qty 5

## 2016-11-21 MED ORDER — PROMETHAZINE HCL 25 MG/ML IJ SOLN: 12.5000 mg | Freq: Four times a day (QID) | INTRAMUSCULAR | Status: DC | PRN

## 2016-11-21 MED ORDER — SODIUM CHLORIDE 0.9 % IV SOLN
80.0000 mg | Freq: Once | INTRAVENOUS | Status: AC
  Administered 2016-11-21: 80 mg via INTRAVENOUS
  Filled 2016-11-21: qty 80

## 2016-11-21 MED ORDER — POTASSIUM CHLORIDE 20 MEQ/15ML (10%) PO SOLN
40.0000 meq | Freq: Once | ORAL | Status: AC
  Administered 2016-11-21: 40 meq via ORAL
  Filled 2016-11-21: qty 30

## 2016-11-21 MED ORDER — LEVOTHYROXINE SODIUM 100 MCG IV SOLR: 25.0000 ug | Freq: Every day | INTRAVENOUS | Status: DC

## 2016-11-21 MED ORDER — MORPHINE SULFATE (PF) 4 MG/ML IV SOLN
4.0000 mg | INTRAVENOUS | Status: DC | PRN
  Administered 2016-11-21: 4 mg via INTRAVENOUS
  Filled 2016-11-21 (×2): qty 1

## 2016-11-21 MED ORDER — SODIUM CHLORIDE 0.9 % IV SOLN
INTRAVENOUS | Status: DC
  Administered 2016-11-21: 09:00:00 via INTRAVENOUS

## 2016-11-21 MED ORDER — PROCHLORPERAZINE EDISYLATE 5 MG/ML IJ SOLN: 10.0000 mg | Freq: Four times a day (QID) | INTRAMUSCULAR | Status: DC | PRN

## 2016-11-21 MED ORDER — SODIUM CHLORIDE 0.9 % IV SOLN
8.0000 mg/h | INTRAVENOUS | Status: DC
  Administered 2016-11-21: 8 mg/h via INTRAVENOUS
  Filled 2016-11-21 (×3): qty 80

## 2016-11-22 ENCOUNTER — Ambulatory Visit: Payer: Medicare Other | Admitting: Hematology

## 2016-11-22 ENCOUNTER — Ambulatory Visit: Payer: Medicare Other

## 2016-11-22 ENCOUNTER — Encounter: Payer: Self-pay | Admitting: Radiation Oncology

## 2016-11-22 ENCOUNTER — Other Ambulatory Visit: Payer: Medicare Other

## 2016-11-22 LAB — ABO/RH: ABO/RH(D): O POS

## 2016-11-23 ENCOUNTER — Ambulatory Visit: Payer: Medicare Other

## 2016-11-24 ENCOUNTER — Ambulatory Visit: Payer: Medicare Other

## 2016-11-25 ENCOUNTER — Ambulatory Visit: Payer: Medicare Other

## 2016-11-25 NOTE — Progress Notes (Signed)
  Radiation Oncology         [phone]) [phone] ________________________________  Name: Fernando Liu MRN: [id number]  Date: 11/22/2016  DOB: 29-Oct-1926  End of Treatment Note  Diagnosis:   83 y.o. male with unstaged adenocarcinoma of the thoracic esophagus      Indication for treatment::  Curative      Radiation treatment dates:   10/26/2016 - 11/18/2016  Site/planned dose:   The patient was to receive 50 Gy in 25 fractions, followed by a 5.4 Gy boost in 3 fractions to a total of 55.4 Gy, delivered daily to his esophagus using the mode "Photon" and technique "IMRT".   Narrative: The patient's status declined during his course of radiation, and his radiotherapy was discontinued after 18 treatments.  Plan: The patient died after discontinuing radiation. ________________________________  Jodelle Gross, MD, PhD  This document serves as a record of services personally performed by Kyung Rudd, MD. It was created on his behalf by Rae Lips, a trained medical scribe. The creation of this record is based on the scribe's personal observations and the provider's statements to them. This document has been checked and approved by the attending provider.

## 2016-11-25 NOTE — Progress Notes (Signed)
Dr. Candiss Norse paged.Son-in-law called to find out what was going on with his father-in-law. [place] and want to know if they need to fly down today.

## 2016-11-25 NOTE — Progress Notes (Signed)
Pt sleeping. Wife at bedside.I asked her if the increase in pain medicine seemed to be helping him. She stated he seemed more comfortable and that he was texting people goodbye.

## 2016-11-25 NOTE — Discharge Summary (Signed)
[facility] Death Note                                                                                                                                                                                               Fernando Liu, is a 83 y.o. male, DOB - [birth year], QTM:[id number]  Admit date - 10/31/2016   Admitting Physician Elwin Mocha, MD  Outpatient Primary MD for the patient is Mosie Lukes, MD  LOS - 1  Chief Complaint  Patient presents with  . Abdominal Pain       Notification: Mosie Lukes, MD notified of death of 12-02-2016   Date and Time of Death - 18.35 on Dec 01, 2016  Pronounced by - RN  History of present illness:   Fernando Liu is a 83 y.o. male with a history of -  esophageal cancer undergoing chemoradiation, liver cirrhosis, gout, low thyroid and neuropathy who was admitted for Abd Pain, N&V and subsequently developed GI Bleed likely upper, was seen by GI, had poor underlying conditioning and overall prognosis, was DNR and goal was med Rx only. Passed away day 2 of Hospital stay likely due to another episode of large GI bleed. Wife was in the room, had long DW wife, she has no questions, she was happy that he did not suffer for a long time.   Final Diagnoses:  Cause if death - Esophageal Cancer, GI Bleed   Signature  Lala Lund M.D on 12/02/16 at 2:10 PM  [facility]  Office Phone [phone]  Total clinical and documentation time for today Under 30 minutes   Last Note              PROGRESS NOTE  Patient Demographics:    Fernando Liu, is a 83 y.o. male, DOB - 12/29/1926, [id number]  Admit date - 11/22/2016   Admitting Physician Elwin Mocha, MD  Outpatient Primary MD for the patient is Mosie Lukes, MD  LOS - 1  Chief Complaint  Patient presents with  . Abdominal Pain       Brief Narrative - Kanton Kamel is a 83 y.o. male with past medical history significant for esophageal cancer undergoing chemoradiation, liver cirrhosis, gout, low thyroid and neuropathy who presents emergency room with chief complaint of abdominal pain.  Patient presents with lower abdominal pain, nausea, vomiting and diarrhea.  Denies any sick contacts.  States that this is a side effect of his cancer treatments.  Patient denies fever chills.  Describes abdominal pain as a ache that is very intense.    Subjective:    [name] today has, No headache, No chest pain, No abdominal pain - positive Nausea, No new weakness tingling or numbness, No Cough - SOB.     Assessment  & Plan :     1. Nausea vomiting.  In a patient with esophageal cancer, undergoing chemotherapy and radiation. Being cared by Dr. Burr Medico and Lisbeth Renshaw.  Nausea vomiting likely due to chemotherapy and radiation however could be from his primary cancer, now there is suspicion of ongoing upper GI bleed.  BUN is high, CT abdomen pelvis and KUB are nonacute, on rectal exam on 12/15/2016 about 100 cc of dark blood was doubt, I have made him n.p.o. except medications, IV PPI drip, type screen done, monitor H&H, GI has been requested to provide consultation.  INR was stable.  Overall long-term prognosis is poor discussed with wife.  He is currently DNR.  2. Adenocarcinoma of the esophagus.focally invasive. Once stable from nausea and vomiting standpoint outpatient follow-up with Dr. Annamaria Boots and Lisbeth Renshaw.   Overall long-term prognosis is poor discussed with wife.  He is currently DNR.  3. Hypothyroidism. Continue supplementation via IV for now.  4. History of gout. Stable  5. Incidental finding of 3 cm infrarenal AAA, liver cirrhosis. Outpatient follow-up with PCP.     Diet :       Family Communication  :   None  Code Status :  Full  Disposition Plan  :  TBD  Consults  : GI  Procedures  :    CT scan abdomen and pelvis - showing esophageal mass, 3 cm infrarenal AAA, possible hepatic cirrhosis  DVT Prophylaxis  : SCD  Lab Results  Component Value Date   PLT 108 (L) 12/15/16    Inpatient Medications  Scheduled Meds:  Continuous Infusions:  PRN Meds:.  Antibiotics  :    Anti-infectives    None         Objective:   Vitals:   11/20/16 1449 11/20/16 2052 2016/12/15 1501 2016-12-15 1900  BP: 123/66 138/64 (!) 123/99   Pulse: 85 87 (!) 106   Resp: 18 20 18    Temp: 98.2 F (36.8 C) 97.7 F (36.5 C) (!) 97.5 F (36.4 C)   TempSrc: Oral Oral Axillary   SpO2: 97% 99% 94%   Weight:    65.8 kg (145 lb)  Height:        Wt Readings from Last 3 Encounters:  2016-12-15 65.8 kg (145 lb)  11/16/16 66 kg (145 lb 8 oz)  11/02/16 66.8 kg (147 lb 3.2 oz)     Intake/Output Summary (Last 24 hours) at 11/22/16 1410 Last data  filed at 11-24-2016 1500  Gross per 24 hour  Intake                0 ml  Output              200 ml  Net             -200 ml     Physical Exam  Awake Alert, Oriented X 3, No new F.N deficits, Normal affect [place].AT,PERRAL Supple Neck,No JVD, No cervical lymphadenopathy appriciated.  Symmetrical Chest wall movement, Good air movement bilaterally, CTAB RRR,No Gallops, Rubs or new Murmurs, No Parasternal Heave +ve B.Sounds, Abd Soft, No tenderness, No organomegaly appriciated, No rebound - guarding or rigidity.  On rectal exam, no rectal mass however dark blood came out about 100 cc after finger removal No Cyanosis, Clubbing or edema, No new Rash or bruise     Data Review:    CBC  Recent Labs Lab 11/16/16 0937 11/02/2016 0544 11/20/16 0402 24-Nov-2016 0407 24-Nov-2016 0840 11/24/16 1352  WBC 5.8 6.2 1.4* 4.0  --   --   HGB 14.8 15.5 12.8* 13.9 14.5 15.0  HCT 43.5 42.2 36.4* 39.4 41.5 42.9  PLT 149 154 92* 108*  --   --    MCV 98.8* 92.3 94.5 94.5  --   --   MCH 33.7* 33.9 33.2 33.3  --   --   MCHC 34.1 36.7* 35.2 35.3  --   --   RDW 13.3 14.6 14.9 15.3  --   --   LYMPHSABS 0.3* 0.3*  --  0.1*  --   --   MONOABS 0.3 0.1  --  0.1  --   --   EOSABS 0.4 0.0  --  0.0  --   --   BASOSABS 0.0 0.0  --  0.0  --   --     Chemistries   Recent Labs Lab 11/16/16 0937 11/24/2016 0544 11/20/16 0402 November 24, 2016 0407  NA 134* 134* 141 143  K 4.5 3.4* 3.8 3.5  CL  --  96* 111 115*  CO2 22 20* 21* 20*  GLUCOSE 119 165* 144* 176*  BUN 42.3* 63* 54* 61*  CREATININE 1.3 1.58* 1.30* 1.37*  CALCIUM 8.9 9.6 8.2* 8.0*  AST 29 37  --   --   ALT 25 31  --   --   ALKPHOS 125 105  --   --   BILITOT 0.97 2.0*  --   --    ------------------------------------------------------------------------------------------------------------------ No results for input(s): CHOL, HDL, LDLCALC, TRIG, CHOLHDL, LDLDIRECT in the last 72 hours.  Lab Results  Component Value Date   HGBA1C 5.2 05/07/2016   ------------------------------------------------------------------------------------------------------------------ No results for input(s): TSH, T4TOTAL, T3FREE, THYROIDAB in the last 72 hours.  Invalid input(s): FREET3 ------------------------------------------------------------------------------------------------------------------ No results for input(s): VITAMINB12, FOLATE, FERRITIN, TIBC, IRON, RETICCTPCT in the last 72 hours.  Coagulation profile  Recent Labs Lab 11/24/2016 0840  INR 1.32    No results for input(s): DDIMER in the last 72 hours.  Cardiac Enzymes No results for input(s): CKMB, TROPONINI, MYOGLOBIN in the last 168 hours.  Invalid input(s): CK ------------------------------------------------------------------------------------------------------------------ No results found for: BNP  Micro Results No results found for this or any previous visit (from the past 240 hour(s)).  Radiology Reports Dg Chest 2  View  Result Date: 11/06/2016 CLINICAL DATA:  Worsening abdominal pain with nausea and vomiting. Being treated for esophageal cancer. EXAM: CHEST  2 VIEW COMPARISON:  07/01/2016 FINDINGS: Heart size remains normal.  Chronic aortic atherosclerosis. Left chest remains clear. Underlying emphysema. On the right, there is a small amount of pleural fluid layering dependently with mild atelectasis. IMPRESSION: Re- demonstration of small right effusion with mild right base atelectasis. No other acute finding. Aortic atherosclerosis. Electronically Signed   By: Nelson Chimes M.D.   On: 11/13/2016 07:11   Ct Abdomen Pelvis W Contrast  Result Date: 11/22/2016 CLINICAL DATA:  83 year old male with history of abdominal pain, nausea, vomiting and diarrhea. Undergoing radiation therapy and chemotherapy for esophageal carcinoma. EXAM: CT ABDOMEN AND PELVIS WITH CONTRAST TECHNIQUE: Multidetector CT imaging of the abdomen and pelvis was performed using the standard protocol following bolus administration of intravenous contrast. CONTRAST:  50 mL of Isovue-300. COMPARISON:  PET-CT 10/19/2016. CT the chest, abdomen and pelvis 10/01/2016. FINDINGS: Lower chest: Aortic atherosclerosis. Atherosclerotic calcifications in the left anterior descending, left circumflex and right coronary arteries. Calcifications of the aortic valve. Thickening of the distal esophagus incompletely visualized, but corresponding to hypermetabolic activity on prior PET-CT, presumably related to residual mass. The more distal esophagus shortly above the gastroesophageal junction is patulous. Small to moderate right pleural effusion predominantly lying dependently, but with some anteriorly loculated fluid is well and fluid tracking in the major fissure. Some associated passive subsegmental atelectasis in the base of the right lung. Hepatobiliary: Liver has a shrunken appearance and nodular contour, indicative of cirrhosis. No cystic or solid hepatic lesions.  No intra or extrahepatic biliary ductal dilatation. Gallbladder is normal in appearance. Pancreas: No pancreatic mass. No pancreatic ductal dilatation. No pancreatic or peripancreatic fluid or inflammatory changes. Spleen: Unremarkable. Adrenals/Urinary Tract: Multiple low-attenuation lesions in both kidneys, compatible with simple cysts, the largest of which is exophytic measuring 4.2 cm in the upper pole the left kidney. Several other subcentimeter low-attenuation lesions are noted in both kidneys, too small to characterize, but statistically likely to represent tiny cysts. No hydroureteronephrosis. Urinary bladder is normal in appearance. Bilateral adrenal glands are normal in appearance. Stomach/Bowel: Stomach is distended and fluid-filled. No pathologic dilatation of small bowel or colon. Several colonic diverticulae are noted, without surrounding inflammatory changes to suggest an acute diverticulitis at this time. Normal appendix. Previously identified rectal mass is not confidently visualized on today's examination, as the distal rectum is completely collapsed. Vascular/Lymphatic: Extensive aortic atherosclerosis with mild fusiform aneurysmal dilatation of the infrarenal abdominal aorta which measures up to 3.1 x 2.9 cm. No lymphadenopathy noted in the abdomen or pelvis. Reproductive: Prostate gland seminal vesicles are unremarkable in appearance. Other: No significant volume of ascites.  No pneumoperitoneum. Musculoskeletal: There are no aggressive appearing lytic or blastic lesions noted in the visualized portions of the skeleton. IMPRESSION: 1. Persistent mass-like thickening of the distal esophagus, compatible with the reported clinical history of esophageal malignancy. No definite metastatic disease identified in the abdomen or pelvis. 2. Previously identified the rectal mass is obscured on today's examination as the distal rectum is completely decompressed. 3. Small to moderate sized right pleural  effusion which appears partially loculated, and is associated with areas of passive subsegmental atelectasis in the right lung base. 4. Stigmata of cirrhosis redemonstrated. No focal hepatic lesion identified at this time. 5. Colonic diverticulosis without evidence of acute diverticulitis at this time. 6. Aortic atherosclerosis, in addition to at least 3 vessel coronary artery disease. In addition, there is mild fusiform aneurysmal dilatation of the infrarenal abdominal aorta which has a mean diameter up to 3 cm. Recommend followup by ultrasound in 3 years. This recommendation follows ACR consensus guidelines:  White Paper of the ACR Incidental Findings Committee II on Vascular Findings. J Am Coll Radiol 2013; 10:789-794. 7. Additional incidental findings, as above. Aortic Atherosclerosis (ICD10-I70.0) and Aortic Aneurysm NOS (ICD10-I71.9). Electronically Signed   By: Vinnie Langton M.D.   On: 10/26/2016 08:21   Dg Abd Portable 1v  Result Date: 11/26/16 CLINICAL DATA:  Nausea and vomiting onset 5 hours ago. EXAM: PORTABLE ABDOMEN - 1 VIEW COMPARISON:  Plain film of the abdomen dated 11/20/2016 FINDINGS: Visualized bowel gas pattern is nonobstructive. No evidence of soft tissue mass or abnormal fluid collections seen. No evidence of free intraperitoneal air. Vascular calcifications again noted within the left upper quadrant. Degenerative changes throughout the thoracolumbar spine, at least moderate in degree. Milder degenerative changes noted at each hip joint. No acute or suspicious osseous finding. IMPRESSION: No acute findings.  Nonobstructive bowel gas pattern. Electronically Signed   By: Franki Cabot M.D.   On: Nov 26, 2016 09:40   Dg Abd Portable 1v  Result Date: 11/20/2016 CLINICAL DATA:  Nausea and vomiting. EXAM: PORTABLE ABDOMEN - 1 VIEW COMPARISON:  CT 11/23/2016 FINDINGS: Portable supine view of the abdomen demonstrates gaseous distension of the stomach. There is gas in the small and large  bowel. Splenic artery is heavily calcified. Right pleural fluid is partially visualized. IMPRESSION: Gaseous distension of the stomach. Right pleural effusion. Electronically Signed   By: Markus Daft M.D.   On: 11/20/2016 08:43    Time Spent in minutes  30   Lala Lund M.D on 11/22/2016 at 2:10 PM  Between 7am to 7pm - Pager - [phone] ( page via [email or web address], text pages only, please mention full 10 digit call back number). After 7pm go to [email or web address] - password [facility]

## 2016-11-25 NOTE — Progress Notes (Signed)
Pt found not breathing, wife was asleep in the room. No respirations and no heart beat was seen or heard. This was verified by Nancy Marus. Pt appears he may have bled out, large pool of blood found under him and dripped to the floor. Dr. Candiss Norse called and [facility] notified.

## 2016-11-25 NOTE — Progress Notes (Signed)
Dr. Candiss Norse paged through amnion. Son-in-law wants to know about his father-in-law's condition.

## 2016-11-25 NOTE — Progress Notes (Signed)
[facility] Death Note                                                                                                                                                                                               Fernando Liu, is a 83 y.o. male, DOB - [birth year], JXB:[id number]  Admit date - 11/07/2016   Admitting Physician Elwin Mocha, MD  Outpatient Primary MD for the patient is Mosie Lukes, MD  LOS - 1  Chief Complaint  Patient presents with  . Abdominal Pain       Notification: Mosie Lukes, MD notified of death of November 29, 2016   Date and Time of Death - 18.35 on 11-29-2016  Pronounced by - RN  History of present illness:   Fernando Liu is a 83 y.o. male with a history of -  esophageal cancer undergoing chemoradiation, liver cirrhosis, gout, low thyroid and neuropathy who was admitted for Abd Pain, N&V and subsequently developed GI Bleed likely upper, was seen by GI, had poor underlying conditioning and overall prognosis, was DNR and goal was med Rx only. Passed away day 2 of Hospital stay likely due to another episode of large GI bleed. Wife was in the room, had long DW wife, she has no questions, she was happy that he did not suffer for a long time.   Final Diagnoses:  Cause if death - Esophageal Cancer, GI Bleed   Signature  Lala Lund M.D on 11/29/16 at 7:00 PM  [facility]  Office Phone [phone]  Total clinical and documentation time for today Under 30 minutes   Last Note              PROGRESS NOTE  Patient Demographics:    Fernando Liu, is a 83 y.o. male, DOB - 1926/07/12, ION:[id number]  Admit date - 11/17/2016   Admitting Physician Elwin Mocha, MD  Outpatient Primary MD for the patient is Mosie Lukes, MD  LOS - 1  Chief Complaint  Patient presents with  . Abdominal Pain       Brief Narrative - Mahmoud Blazejewski is a 83 y.o. male with past medical history significant for esophageal cancer undergoing chemoradiation, liver cirrhosis, gout, low thyroid and neuropathy who presents emergency room with chief complaint of abdominal pain.  Patient presents with lower abdominal pain, nausea, vomiting and diarrhea.  Denies any sick contacts.  States that this is a side effect of his cancer treatments.  Patient denies fever chills.  Describes abdominal pain as a ache that is very intense.    Subjective:    Jonatha Gagen today has, No headache, No chest pain, No abdominal pain - positive Nausea, No new weakness tingling or numbness, No Cough - SOB.     Assessment  & Plan :     1. Nausea vomiting.  In a patient with esophageal cancer, undergoing chemotherapy and radiation. Being cared by Dr. Burr Medico and Lisbeth Renshaw.  Nausea vomiting likely due to chemotherapy and radiation however could be from his primary cancer, now there is suspicion of ongoing upper GI bleed.  BUN is high, CT abdomen pelvis and KUB are nonacute, on rectal exam on 12-21-2016 about 100 cc of dark blood was doubt, I have made him n.p.o. except medications, IV PPI drip, type screen done, monitor H&H, GI has been requested to provide consultation.  INR was stable.  Overall long-term prognosis is poor discussed with wife.  He is currently DNR.  2. Adenocarcinoma of the esophagus.focally invasive. Once stable from nausea and vomiting standpoint outpatient follow-up with Dr. Annamaria Boots and Lisbeth Renshaw.   Overall long-term prognosis is poor discussed with wife.  He is currently DNR.  3. Hypothyroidism. Continue supplementation via IV for now.  4. History of gout. Stable  5. Incidental finding of 3 cm infrarenal AAA, liver cirrhosis. Outpatient follow-up with PCP.     Diet : Diet NPO  time specified Except for: Sips with Meds    Family Communication  :   None  Code Status :  Full  Disposition Plan  :  TBD  Consults  : GI  Procedures  :    CT scan abdomen and pelvis - showing esophageal mass, 3 cm infrarenal AAA, possible hepatic cirrhosis  DVT Prophylaxis  : SCD  Lab Results  Component Value Date   PLT 108 (L) 2016-12-21    Inpatient Medications  Scheduled Meds: . levothyroxine  37.5 mcg Intravenous Daily  . metoprolol tartrate  25 mg Oral Daily  . [START ON 11/24/2016] pantoprazole  40 mg Intravenous Q12H   Continuous Infusions: . sodium chloride 75 mL/hr at 21-Dec-2016 0901  . pantoprozole (PROTONIX) infusion 8 mg/hr (12-21-2016 1212)   PRN Meds:.acetaminophen **OR** acetaminophen, hydrALAZINE, morphine injection, nitroGLYCERIN, ondansetron, prochlorperazine, promethazine, simethicone  Antibiotics  :    Anti-infectives    None         Objective:   Vitals:   11/20/16 0535 11/20/16 1449 11/20/16 2052 12/21/2016 1501  BP: (!) 109/58 123/66 138/64 (!) 123/99  Pulse: 70 85 87 (!) 106  Resp: 20 18 20 18   Temp: 98 F (36.7 C) 98.2 F (36.8 C) 97.7 F (36.5 C) (!) 97.5 F (36.4 C)  TempSrc: Oral Oral Oral  Axillary  SpO2: 97% 97% 99% 94%  Weight:      Height:        Wt Readings from Last 3 Encounters:  11/17/2016 65.8 kg (145 lb)  11/16/16 66 kg (145 lb 8 oz)  11/02/16 66.8 kg (147 lb 3.2 oz)     Intake/Output Summary (Last 24 hours) at 2016-12-06 1900 Last data filed at 12-06-16 1500  Gross per 24 hour  Intake           1097.5 ml  Output              200 ml  Net            897.5 ml     Physical Exam  Awake Alert, Oriented X 3, No new F.N deficits, Normal affect [place].AT,PERRAL Supple Neck,No JVD, No cervical lymphadenopathy appriciated.  Symmetrical Chest wall movement, Good air movement bilaterally, CTAB RRR,No Gallops, Rubs or new Murmurs, No Parasternal Heave +ve B.Sounds, Abd Soft, No tenderness, No organomegaly appriciated, No  rebound - guarding or rigidity.  On rectal exam, no rectal mass however dark blood came out about 100 cc after finger removal No Cyanosis, Clubbing or edema, No new Rash or bruise     Data Review:    CBC  Recent Labs Lab 11/16/16 0937 11/20/2016 0544 11/20/16 0402 12-06-16 0407 12/06/16 0840 06-Dec-2016 1352  WBC 5.8 6.2 1.4* 4.0  --   --   HGB 14.8 15.5 12.8* 13.9 14.5 15.0  HCT 43.5 42.2 36.4* 39.4 41.5 42.9  PLT 149 154 92* 108*  --   --   MCV 98.8* 92.3 94.5 94.5  --   --   MCH 33.7* 33.9 33.2 33.3  --   --   MCHC 34.1 36.7* 35.2 35.3  --   --   RDW 13.3 14.6 14.9 15.3  --   --   LYMPHSABS 0.3* 0.3*  --  0.1*  --   --   MONOABS 0.3 0.1  --  0.1  --   --   EOSABS 0.4 0.0  --  0.0  --   --   BASOSABS 0.0 0.0  --  0.0  --   --     Chemistries   Recent Labs Lab 11/16/16 0937 11/14/2016 0544 11/20/16 0402 12/06/16 0407  NA 134* 134* 141 143  K 4.5 3.4* 3.8 3.5  CL  --  96* 111 115*  CO2 22 20* 21* 20*  GLUCOSE 119 165* 144* 176*  BUN 42.3* 63* 54* 61*  CREATININE 1.3 1.58* 1.30* 1.37*  CALCIUM 8.9 9.6 8.2* 8.0*  AST 29 37  --   --   ALT 25 31  --   --   ALKPHOS 125 105  --   --   BILITOT 0.97 2.0*  --   --    ------------------------------------------------------------------------------------------------------------------ No results for input(s): CHOL, HDL, LDLCALC, TRIG, CHOLHDL, LDLDIRECT in the last 72 hours.  Lab Results  Component Value Date   HGBA1C 5.2 05/07/2016   ------------------------------------------------------------------------------------------------------------------ No results for input(s): TSH, T4TOTAL, T3FREE, THYROIDAB in the last 72 hours.  Invalid input(s): FREET3 ------------------------------------------------------------------------------------------------------------------ No results for input(s): VITAMINB12, FOLATE, FERRITIN, TIBC, IRON, RETICCTPCT in the last 72 hours.  Coagulation profile  Recent Labs Lab 12/06/16 0840    INR 1.32    No results for input(s): DDIMER in the last 72 hours.  Cardiac Enzymes No results for input(s): CKMB, TROPONINI, MYOGLOBIN in the last 168 hours.  Invalid input(s): CK ------------------------------------------------------------------------------------------------------------------ No results  found for: BNP  Micro Results No results found for this or any previous visit (from the past 240 hour(s)).  Radiology Reports Dg Chest 2 View  Result Date: 10/27/2016 CLINICAL DATA:  Worsening abdominal pain with nausea and vomiting. Being treated for esophageal cancer. EXAM: CHEST  2 VIEW COMPARISON:  07/01/2016 FINDINGS: Heart size remains normal. Chronic aortic atherosclerosis. Left chest remains clear. Underlying emphysema. On the right, there is a small amount of pleural fluid layering dependently with mild atelectasis. IMPRESSION: Re- demonstration of small right effusion with mild right base atelectasis. No other acute finding. Aortic atherosclerosis. Electronically Signed   By: Nelson Chimes M.D.   On: 10/28/2016 07:11   Ct Abdomen Pelvis W Contrast  Result Date: 11/16/2016 CLINICAL DATA:  83 year old male with history of abdominal pain, nausea, vomiting and diarrhea. Undergoing radiation therapy and chemotherapy for esophageal carcinoma. EXAM: CT ABDOMEN AND PELVIS WITH CONTRAST TECHNIQUE: Multidetector CT imaging of the abdomen and pelvis was performed using the standard protocol following bolus administration of intravenous contrast. CONTRAST:  50 mL of Isovue-300. COMPARISON:  PET-CT 10/19/2016. CT the chest, abdomen and pelvis 10/01/2016. FINDINGS: Lower chest: Aortic atherosclerosis. Atherosclerotic calcifications in the left anterior descending, left circumflex and right coronary arteries. Calcifications of the aortic valve. Thickening of the distal esophagus incompletely visualized, but corresponding to hypermetabolic activity on prior PET-CT, presumably related to residual  mass. The more distal esophagus shortly above the gastroesophageal junction is patulous. Small to moderate right pleural effusion predominantly lying dependently, but with some anteriorly loculated fluid is well and fluid tracking in the major fissure. Some associated passive subsegmental atelectasis in the base of the right lung. Hepatobiliary: Liver has a shrunken appearance and nodular contour, indicative of cirrhosis. No cystic or solid hepatic lesions. No intra or extrahepatic biliary ductal dilatation. Gallbladder is normal in appearance. Pancreas: No pancreatic mass. No pancreatic ductal dilatation. No pancreatic or peripancreatic fluid or inflammatory changes. Spleen: Unremarkable. Adrenals/Urinary Tract: Multiple low-attenuation lesions in both kidneys, compatible with simple cysts, the largest of which is exophytic measuring 4.2 cm in the upper pole the left kidney. Several other subcentimeter low-attenuation lesions are noted in both kidneys, too small to characterize, but statistically likely to represent tiny cysts. No hydroureteronephrosis. Urinary bladder is normal in appearance. Bilateral adrenal glands are normal in appearance. Stomach/Bowel: Stomach is distended and fluid-filled. No pathologic dilatation of small bowel or colon. Several colonic diverticulae are noted, without surrounding inflammatory changes to suggest an acute diverticulitis at this time. Normal appendix. Previously identified rectal mass is not confidently visualized on today's examination, as the distal rectum is completely collapsed. Vascular/Lymphatic: Extensive aortic atherosclerosis with mild fusiform aneurysmal dilatation of the infrarenal abdominal aorta which measures up to 3.1 x 2.9 cm. No lymphadenopathy noted in the abdomen or pelvis. Reproductive: Prostate gland seminal vesicles are unremarkable in appearance. Other: No significant volume of ascites.  No pneumoperitoneum. Musculoskeletal: There are no aggressive  appearing lytic or blastic lesions noted in the visualized portions of the skeleton. IMPRESSION: 1. Persistent mass-like thickening of the distal esophagus, compatible with the reported clinical history of esophageal malignancy. No definite metastatic disease identified in the abdomen or pelvis. 2. Previously identified the rectal mass is obscured on today's examination as the distal rectum is completely decompressed. 3. Small to moderate sized right pleural effusion which appears partially loculated, and is associated with areas of passive subsegmental atelectasis in the right lung base. 4. Stigmata of cirrhosis redemonstrated. No focal hepatic lesion identified at this time.  5. Colonic diverticulosis without evidence of acute diverticulitis at this time. 6. Aortic atherosclerosis, in addition to at least 3 vessel coronary artery disease. In addition, there is mild fusiform aneurysmal dilatation of the infrarenal abdominal aorta which has a mean diameter up to 3 cm. Recommend followup by ultrasound in 3 years. This recommendation follows ACR consensus guidelines: White Paper of the ACR Incidental Findings Committee II on Vascular Findings. J Am Coll Radiol 2013; 10:789-794. 7. Additional incidental findings, as above. Aortic Atherosclerosis (ICD10-I70.0) and Aortic Aneurysm NOS (ICD10-I71.9). Electronically Signed   By: Vinnie Langton M.D.   On: 10/26/2016 08:21   Dg Abd Portable 1v  Result Date: 11-28-16 CLINICAL DATA:  Nausea and vomiting onset 5 hours ago. EXAM: PORTABLE ABDOMEN - 1 VIEW COMPARISON:  Plain film of the abdomen dated 11/20/2016 FINDINGS: Visualized bowel gas pattern is nonobstructive. No evidence of soft tissue mass or abnormal fluid collections seen. No evidence of free intraperitoneal air. Vascular calcifications again noted within the left upper quadrant. Degenerative changes throughout the thoracolumbar spine, at least moderate in degree. Milder degenerative changes noted at each hip  joint. No acute or suspicious osseous finding. IMPRESSION: No acute findings.  Nonobstructive bowel gas pattern. Electronically Signed   By: Franki Cabot M.D.   On: 2016-11-28 09:40   Dg Abd Portable 1v  Result Date: 11/20/2016 CLINICAL DATA:  Nausea and vomiting. EXAM: PORTABLE ABDOMEN - 1 VIEW COMPARISON:  CT 11/20/2016 FINDINGS: Portable supine view of the abdomen demonstrates gaseous distension of the stomach. There is gas in the small and large bowel. Splenic artery is heavily calcified. Right pleural fluid is partially visualized. IMPRESSION: Gaseous distension of the stomach. Right pleural effusion. Electronically Signed   By: Markus Daft M.D.   On: 11/20/2016 08:43    Time Spent in minutes  30   Lala Lund M.D on 2016/11/28 at 7:00 PM  Between 7am to [place] - [phone] ( page via [email or web address], text pages only, please mention full 10 digit call back number). After 7pm go to [email or web address] - password [email or web address]

## 2016-11-25 NOTE — Progress Notes (Signed)
@IPLOG @        PROGRESS NOTE                                                                                                                                                                                                             Patient Demographics:    Fernando Liu, is a 83 y.o. male, DOB - Feb 16, 1926, VOH:[id number]  Admit date - 11/08/2016   Admitting Physician Elwin Mocha, MD  Outpatient Primary MD for the patient is Mosie Lukes, MD  LOS - 1  Chief Complaint  Patient presents with  . Abdominal Pain       Brief Narrative - Fernando Liu is a 83 y.o. male with past medical history significant for esophageal cancer undergoing chemoradiation, liver cirrhosis, gout, low thyroid and neuropathy who presents emergency room with chief complaint of abdominal pain.  Patient presents with lower abdominal pain, nausea, vomiting and diarrhea.  Denies any sick contacts.  States that this is a side effect of his cancer treatments.  Patient denies fever chills.  Describes abdominal pain as a ache that is very intense.    Subjective:    [name] today has, No headache, No chest pain, No abdominal pain - positive Nausea, No new weakness tingling or numbness, No Cough - SOB.     Assessment  & Plan :     1. Nausea vomiting.  In a patient with esophageal cancer, undergoing chemotherapy and radiation. Being cared by Dr. Burr Medico and Lisbeth Renshaw.  Nausea vomiting likely due to chemotherapy and radiation however could be from his primary cancer, now there is suspicion of ongoing upper GI bleed.  BUN is high, CT abdomen pelvis and KUB are nonacute, on rectal exam on Nov 27, 2016 about 100 cc of dark blood was doubt, I have made him n.p.o. except medications, IV PPI drip, type screen done, monitor H&H, GI has been requested to provide consultation.  INR was stable.  Overall long-term prognosis is poor discussed with wife.  He is currently DNR.  2. Adenocarcinoma of the esophagus.focally invasive.  Once stable from nausea and vomiting standpoint outpatient follow-up with Dr. Annamaria Boots and Lisbeth Renshaw.   Overall long-term prognosis is poor discussed with wife.  He is currently DNR.  3. Hypothyroidism. Continue supplementation via IV for now.  4. History of gout. Stable  5. Incidental finding of 3 cm infrarenal AAA, liver cirrhosis. Outpatient follow-up with PCP.     Diet : Diet NPO time specified Except for: Sips with Meds    Family Communication  :  None  Code Status :  Full  Disposition Plan  :  TBD  Consults  : GI  Procedures  :    CT scan abdomen and pelvis - showing esophageal mass, 3 cm infrarenal AAA, possible hepatic cirrhosis  DVT Prophylaxis  : SCD  Lab Results  Component Value Date   PLT 108 (L) 11-26-2016    Inpatient Medications  Scheduled Meds: . levothyroxine  25 mcg Intravenous Daily  . metoprolol tartrate  25 mg Oral Daily  . [START ON 11/24/2016] pantoprazole  40 mg Intravenous Q12H   Continuous Infusions: . sodium chloride    . pantoprazole (PROTONIX) IVPB    . pantoprozole (PROTONIX) infusion     PRN Meds:.acetaminophen **OR** acetaminophen, hydrALAZINE, morphine injection, nitroGLYCERIN, ondansetron, prochlorperazine, promethazine, simethicone  Antibiotics  :    Anti-infectives    None         Objective:   Vitals:   11/05/2016 2310 11/20/16 0535 11/20/16 1449 11/20/16 2052  BP: 105/65 (!) 109/58 123/66 138/64  Pulse: 72 70 85 87  Resp: 16 20 18 20   Temp: 98.2 F (36.8 C) 98 F (36.7 C) 98.2 F (36.8 C) 97.7 F (36.5 C)  TempSrc: Oral Oral Oral Oral  SpO2: 98% 97% 97% 99%  Weight:      Height:        Wt Readings from Last 3 Encounters:  11/10/2016 65.8 kg (145 lb)  11/16/16 66 kg (145 lb 8 oz)  11/02/16 66.8 kg (147 lb 3.2 oz)     Intake/Output Summary (Last 24 hours) at 2016/11/26 1009 Last data filed at 11-26-16 0316  Gross per 24 hour  Intake           1097.5 ml  Output              250 ml  Net            847.5 ml      Physical Exam  Awake Alert, Oriented X 3, No new F.N deficits, Normal affect [place].AT,PERRAL Supple Neck,No JVD, No cervical lymphadenopathy appriciated.  Symmetrical Chest wall movement, Good air movement bilaterally, CTAB RRR,No Gallops, Rubs or new Murmurs, No Parasternal Heave +ve B.Sounds, Abd Soft, No tenderness, No organomegaly appriciated, No rebound - guarding or rigidity.  On rectal exam, no rectal mass however dark blood came out about 100 cc after finger removal No Cyanosis, Clubbing or edema, No new Rash or bruise     Data Review:    CBC  Recent Labs Lab 11/16/16 0937 11/03/2016 0544 11/20/16 0402 2016-11-26 0407 11/26/2016 0840  WBC 5.8 6.2 1.4* 4.0  --   HGB 14.8 15.5 12.8* 13.9 14.5  HCT 43.5 42.2 36.4* 39.4 41.5  PLT 149 154 92* 108*  --   MCV 98.8* 92.3 94.5 94.5  --   MCH 33.7* 33.9 33.2 33.3  --   MCHC 34.1 36.7* 35.2 35.3  --   RDW 13.3 14.6 14.9 15.3  --   LYMPHSABS 0.3* 0.3*  --  0.1*  --   MONOABS 0.3 0.1  --  0.1  --   EOSABS 0.4 0.0  --  0.0  --   BASOSABS 0.0 0.0  --  0.0  --     Chemistries   Recent Labs Lab 11/16/16 0937 11/20/2016 0544 11/20/16 0402 11/26/2016 0407  NA 134* 134* 141 143  K 4.5 3.4* 3.8 3.5  CL  --  96* 111 115*  CO2 22 20* 21* 20*  GLUCOSE 119 165*  144* 176*  BUN 42.3* 63* 54* 61*  CREATININE 1.3 1.58* 1.30* 1.37*  CALCIUM 8.9 9.6 8.2* 8.0*  AST 29 37  --   --   ALT 25 31  --   --   ALKPHOS 125 105  --   --   BILITOT 0.97 2.0*  --   --    ------------------------------------------------------------------------------------------------------------------ No results for input(s): CHOL, HDL, LDLCALC, TRIG, CHOLHDL, LDLDIRECT in the last 72 hours.  Lab Results  Component Value Date   HGBA1C 5.2 05/07/2016   ------------------------------------------------------------------------------------------------------------------ No results for input(s): TSH, T4TOTAL, T3FREE, THYROIDAB in the last 72 hours.  Invalid  input(s): FREET3 ------------------------------------------------------------------------------------------------------------------ No results for input(s): VITAMINB12, FOLATE, FERRITIN, TIBC, IRON, RETICCTPCT in the last 72 hours.  Coagulation profile  Recent Labs Lab 2016/12/21 0840  INR 1.32    No results for input(s): DDIMER in the last 72 hours.  Cardiac Enzymes No results for input(s): CKMB, TROPONINI, MYOGLOBIN in the last 168 hours.  Invalid input(s): CK ------------------------------------------------------------------------------------------------------------------ No results found for: BNP  Micro Results No results found for this or any previous visit (from the past 240 hour(s)).  Radiology Reports Dg Chest 2 View  Result Date: 11/24/2016 CLINICAL DATA:  Worsening abdominal pain with nausea and vomiting. Being treated for esophageal cancer. EXAM: CHEST  2 VIEW COMPARISON:  07/01/2016 FINDINGS: Heart size remains normal. Chronic aortic atherosclerosis. Left chest remains clear. Underlying emphysema. On the right, there is a small amount of pleural fluid layering dependently with mild atelectasis. IMPRESSION: Re- demonstration of small right effusion with mild right base atelectasis. No other acute finding. Aortic atherosclerosis. Electronically Signed   By: Nelson Chimes M.D.   On: 11/01/2016 07:11   Ct Abdomen Pelvis W Contrast  Result Date: 11/01/2016 CLINICAL DATA:  83 year old male with history of abdominal pain, nausea, vomiting and diarrhea. Undergoing radiation therapy and chemotherapy for esophageal carcinoma. EXAM: CT ABDOMEN AND PELVIS WITH CONTRAST TECHNIQUE: Multidetector CT imaging of the abdomen and pelvis was performed using the standard protocol following bolus administration of intravenous contrast. CONTRAST:  50 mL of Isovue-300. COMPARISON:  PET-CT 10/19/2016. CT the chest, abdomen and pelvis 10/01/2016. FINDINGS: Lower chest: Aortic atherosclerosis.  Atherosclerotic calcifications in the left anterior descending, left circumflex and right coronary arteries. Calcifications of the aortic valve. Thickening of the distal esophagus incompletely visualized, but corresponding to hypermetabolic activity on prior PET-CT, presumably related to residual mass. The more distal esophagus shortly above the gastroesophageal junction is patulous. Small to moderate right pleural effusion predominantly lying dependently, but with some anteriorly loculated fluid is well and fluid tracking in the major fissure. Some associated passive subsegmental atelectasis in the base of the right lung. Hepatobiliary: Liver has a shrunken appearance and nodular contour, indicative of cirrhosis. No cystic or solid hepatic lesions. No intra or extrahepatic biliary ductal dilatation. Gallbladder is normal in appearance. Pancreas: No pancreatic mass. No pancreatic ductal dilatation. No pancreatic or peripancreatic fluid or inflammatory changes. Spleen: Unremarkable. Adrenals/Urinary Tract: Multiple low-attenuation lesions in both kidneys, compatible with simple cysts, the largest of which is exophytic measuring 4.2 cm in the upper pole the left kidney. Several other subcentimeter low-attenuation lesions are noted in both kidneys, too small to characterize, but statistically likely to represent tiny cysts. No hydroureteronephrosis. Urinary bladder is normal in appearance. Bilateral adrenal glands are normal in appearance. Stomach/Bowel: Stomach is distended and fluid-filled. No pathologic dilatation of small bowel or colon. Several colonic diverticulae are noted, without surrounding inflammatory changes to suggest an acute diverticulitis at  this time. Normal appendix. Previously identified rectal mass is not confidently visualized on today's examination, as the distal rectum is completely collapsed. Vascular/Lymphatic: Extensive aortic atherosclerosis with mild fusiform aneurysmal dilatation of the  infrarenal abdominal aorta which measures up to 3.1 x 2.9 cm. No lymphadenopathy noted in the abdomen or pelvis. Reproductive: Prostate gland seminal vesicles are unremarkable in appearance. Other: No significant volume of ascites.  No pneumoperitoneum. Musculoskeletal: There are no aggressive appearing lytic or blastic lesions noted in the visualized portions of the skeleton. IMPRESSION: 1. Persistent mass-like thickening of the distal esophagus, compatible with the reported clinical history of esophageal malignancy. No definite metastatic disease identified in the abdomen or pelvis. 2. Previously identified the rectal mass is obscured on today's examination as the distal rectum is completely decompressed. 3. Small to moderate sized right pleural effusion which appears partially loculated, and is associated with areas of passive subsegmental atelectasis in the right lung base. 4. Stigmata of cirrhosis redemonstrated. No focal hepatic lesion identified at this time. 5. Colonic diverticulosis without evidence of acute diverticulitis at this time. 6. Aortic atherosclerosis, in addition to at least 3 vessel coronary artery disease. In addition, there is mild fusiform aneurysmal dilatation of the infrarenal abdominal aorta which has a mean diameter up to 3 cm. Recommend followup by ultrasound in 3 years. This recommendation follows ACR consensus guidelines: White Paper of the ACR Incidental Findings Committee II on Vascular Findings. J Am Coll Radiol 2013; 10:789-794. 7. Additional incidental findings, as above. Aortic Atherosclerosis (ICD10-I70.0) and Aortic Aneurysm NOS (ICD10-I71.9). Electronically Signed   By: Vinnie Langton M.D.   On: 11/14/2016 08:21   Dg Abd Portable 1v  Result Date: 2016-12-12 CLINICAL DATA:  Nausea and vomiting onset 5 hours ago. EXAM: PORTABLE ABDOMEN - 1 VIEW COMPARISON:  Plain film of the abdomen dated 11/20/2016 FINDINGS: Visualized bowel gas pattern is nonobstructive. No evidence of  soft tissue mass or abnormal fluid collections seen. No evidence of free intraperitoneal air. Vascular calcifications again noted within the left upper quadrant. Degenerative changes throughout the thoracolumbar spine, at least moderate in degree. Milder degenerative changes noted at each hip joint. No acute or suspicious osseous finding. IMPRESSION: No acute findings.  Nonobstructive bowel gas pattern. Electronically Signed   By: Franki Cabot M.D.   On: 12-12-2016 09:40   Dg Abd Portable 1v  Result Date: 11/20/2016 CLINICAL DATA:  Nausea and vomiting. EXAM: PORTABLE ABDOMEN - 1 VIEW COMPARISON:  CT 11/14/2016 FINDINGS: Portable supine view of the abdomen demonstrates gaseous distension of the stomach. There is gas in the small and large bowel. Splenic artery is heavily calcified. Right pleural fluid is partially visualized. IMPRESSION: Gaseous distension of the stomach. Right pleural effusion. Electronically Signed   By: Markus Daft M.D.   On: 11/20/2016 08:43    Time Spent in minutes  30   Lala Lund M.D on Dec 12, 2016 at 10:09 AM  Between 7am to 7pm - Pager - [phone] ( page via [email or web address], text pages only, please mention full 10 digit call back number). After 7pm go to [email or web address] - password [email or web address]

## 2016-11-25 NOTE — Progress Notes (Signed)
Dr Candiss Norse paged to inform him the son-in-law wants to speak to him.

## 2016-11-25 NOTE — Consult Note (Addendum)
Consultation  Referring Provider: Dr. Candiss Norse     Primary Care Physician:  Mosie Lukes, MD Primary Gastroenterologist:  Dr. Carlean Purl       Reason for Consultation:  Rectal Bleeding, Cirrhosis, Esophageal cancer            HPI:   Fernando Liu is a 83 y.o. male with a history of adenocarcinoma of the esophagus, anemia, cryptogenic cirrhosis with decompensation with ascites and pleural effusion (hepatic hydrothorax), and others listed below, who presented to the ER 10/29/2016 with a complaint of abdominal pain.    Per chart review patient is seeing Dr. Burr Medico oncology treatment of his esophageal adenocarcinoma.  He is on radiation and chemotherapy started 10/26/16, chemo consists of Xeloda.   Today, the patient describes that he started with extreme 9-10 sharp/cramping lower abdominal pain on Thursday evening, 11/18/16, this continued throughout the day and into Friday when he proceeded to the ER. Currently, this constant pain has changed to intermittent and comes on in waves every 30-45 min, worse if he "lies flat for too long".  Patient notes that along with this he has had a change in his bowel habits towards liquid diarrhea that started at the same time as his abdominal pain.  He tells me that "my sphincter is shot" after surgery many years ago and he cannot hold the stool and it all.  He tells me that this just "oozes out all day long".  Patient also describes some associated nausea and an episode of vomiting. So much so that he has not been able to keep down any of his normal medications since Thursday.  Patient last ate on Thursday.      Patient tells me he cannot sleep here and is very fatigued due to this and is having trouble getting up and walking around.  Patient's regular bowel habits consist of a variance from hard to soft stool and patient typically starts his morning routine by taking a shower and using the shower head to do a sitz bath which helps him with his bowel movements.    Patient does tell me he has had occasional rectal rectal bleeding over the years, but with the increase in frequency of his stools this has gotten worse over the past few days.  This is always a bright red blood.     Patient denies any sick contacts or recent antibiotics.  He did start chemo and radiation at the end of September.     Patient denies fever, chills, anorexia or change in diet recently.  ED course: Patient given 2 L of fluid and morphine for abdominal pain.  Patient feels better.  CT abdomen pelvis done that showed no acute bleeding source or sign of infection.  Hospitalist consulted for admission.  Previous GI history: 10/07/16-flex sigmoidoscopy, Dr. Carlean Purl: 2 cm rubbery textured and smooth rectal mass palpated 2-3 cm from the anal, 20 mm polyp in the rectum and otherwise normal exam. 09/23/16-EGD, Dr. Carlean Purl: Malignant esophageal tumor in the middle and lower third of the esophagus, congestion and mosaic pattern mucosa in the cardia, fundus and gastric body  Past Medical History:  Diagnosis Date  . Adenocarcinoma of esophagus ([place]) 09/24/2016  . Anemia 12/28/2015  . CAD (coronary artery disease)   . Chicken pox 83 yrs old  . Cirrhosis of liver ([place]) 06/29/2016  . Decreased breath sounds 05/27/2016  . Gout 04/24/2015  . High cholesterol   . Hypertension   . Hypothyroid   . Measles  as a child  . Medicare annual wellness visit, subsequent 10/31/2012   Living will wife and daughter have a copy, does not want heroic measures Sees cardiology his recent cardiologist is retiring his new doctor will be at [employer]   . Neuropathy   . Osteoporosis 12/01/2015  . Pleural effusion on right 08/01/2016  . Preventative health care 10/31/2012    Past Surgical History:  Procedure Laterality Date  . CATARACT EXTRACTION, BILATERAL     August & November 2012  . ESOPHAGOGASTRODUODENOSCOPY  08/2016   Esophageal cancer  . FOOT SURGERY Left   . HERNIA REPAIR    . PELVIC ABCESS DRAINAGE     states  surgery affected control of BM  . TONSILLECTOMY      Family History  Problem Relation Age of Onset  . Stroke Mother   . Hypertension Mother   . Cancer Maternal Grandmother        gastric cancer   . Heart disease Father   . Cancer Daughter 44       lymphoma   . Cancer Cousin        leukemia   . Breast cancer Neg Hx   . Diabetes Neg Hx   . Colon cancer Neg Hx   . Prostate cancer Neg Hx      Social History  Substance Use Topics  . Smoking status: Never Smoker  . Smokeless tobacco: Never Used  . Alcohol use No     Comment: Used to have 2 shots of bourbon daily for many years but stopped summer 2018    Prior to Admission medications   Medication Sig Start Date End Date Taking? Authorizing Provider  alendronate (FOSAMAX) 70 MG tablet TAKE 1 TABLET BY MOUTH ONCE PER WEEK, TAKE WITH FULL GLASS OF WATER ON EMPTY STOMACH 10/27/16  Yes [provider]  allopurinol (ZYLOPRIM) 100 MG tablet TAKE 1 TABLET (100 MG TOTAL) BY MOUTH DAILY. 06/22/16  Yes Mosie Lukes, MD  atorvastatin (LIPITOR) 10 MG tablet TAKE 1 TABLET BY MOUTH AT BEDTIME 11/05/16  Yes Mosie Lukes, MD  b complex vitamins tablet Take 1 tablet by mouth every evening.    Yes [provider]  Calcium Carb-Cholecalciferol (CALCIUM 600 + D PO) Take 600 mg by mouth 2 (two) times daily.    Yes [provider]  cholecalciferol (VITAMIN D) 1000 units tablet Take 1,000 Units by mouth daily.   Yes [provider]  colestipol (MICRONIZED COLESTIPOL HCL) 1 g tablet Take 1 tablet (1 g total) by mouth 2 (two) times daily. Patient taking differently: Take 1 g by mouth 2 (two) times daily as needed (for diarrhea).  09/06/16  Yes Mosie Lukes, MD  emollient (BIAFINE) cream Apply 1 application topically daily. Apply to chest and abdomen after rad txs when skin is red or irritation occurs daily and prn   Yes Hayden Pedro, PA-C  levothyroxine (SYNTHROID, LEVOTHROID) 75 MCG tablet TAKE 1 TABLET BY  MOUTH EVERY DAY 11/05/16  Yes Mosie Lukes, MD  loperamide (IMODIUM A-D) 2 MG tablet Take 2 mg by mouth 4 (four) times daily as needed for diarrhea or loose stools.   Yes [provider]  metoprolol tartrate (LOPRESSOR) 25 MG tablet Take 25 mg by mouth daily.    Yes [provider]  Multiple Vitamins-Minerals (MULTIVITAMIN ADULT PO) Take 1 tablet by mouth 2 (two) times a week.   Yes [provider]  nitroGLYCERIN (NITROSTAT) 0.4 MG SL tablet Place 0.4  mg under the tongue every 5 (five) minutes as needed. For chest pain   Yes [provider]  ranitidine (ZANTAC) 150 MG capsule Take 1 capsule (150 mg total) by mouth every evening. For acid protection Patient taking differently: Take 150 mg by mouth daily as needed for heartburn.  12/12/15  Yes Mosie Lukes, MD  spironolactone (ALDACTONE) 50 MG tablet Take 1 tablet (50 mg total) by mouth daily. 09/20/16  Yes Gatha Mayer, MD  XELODA 500 MG tablet Take 1,500 mg by mouth See admin instructions. Take 1500 mg by mouth every 12 hours Monday thru Friday on radiation days.  11/11/16  Yes [provider]    Current Facility-Administered Medications  Medication Dose Route Frequency Provider Last Rate Last Dose  . 0.9 %  sodium chloride infusion   Intravenous Continuous Thurnell Lose, MD      . acetaminophen (TYLENOL) tablet 650 mg  650 mg Oral Q6H PRN Elwin Mocha, MD       Or  . acetaminophen (TYLENOL) suppository 650 mg  650 mg Rectal Q6H PRN Elwin Mocha, MD      . hydrALAZINE (APRESOLINE) injection 10 mg  10 mg Intravenous Q8H PRN Elwin Mocha, MD      . levothyroxine (SYNTHROID, LEVOTHROID) tablet 75 mcg  75 mcg Oral QAC breakfast Elwin Mocha, MD   75 mcg at 11/20/2016 1225  . metoprolol tartrate (LOPRESSOR) tablet 25 mg  25 mg Oral Daily Elwin Mocha, MD   25 mg at 10/26/2016 1225  . morphine 4 MG/ML injection 2 mg  2 mg Intravenous Q2H PRN Elwin Mocha, MD   2 mg at 11/20/16  2345  . nitroGLYCERIN (NITROSTAT) SL tablet 0.4 mg  0.4 mg Sublingual Q5 min PRN Elwin Mocha, MD      . ondansetron [facility]) injection 4 mg  4 mg Intravenous Q6H PRN Elwin Mocha, MD   4 mg at 11/20/16 [phone]  . pantoprazole (PROTONIX) 80 mg in sodium chloride 0.9 % 100 mL IVPB  80 mg Intravenous Once Thurnell Lose, MD      . pantoprazole (PROTONIX) 80 mg in sodium chloride 0.9 % 250 mL (0.32 mg/mL) infusion  8 mg/hr Intravenous Continuous Thurnell Lose, MD      . Derrill Memo ON 11/24/2016] pantoprazole (PROTONIX) injection 40 mg  40 mg Intravenous Q12H Thurnell Lose, MD      . prochlorperazine (COMPAZINE) injection 10 mg  10 mg Intravenous Q6H PRN Thurnell Lose, MD      . promethazine (PHENERGAN) injection 12.5 mg  12.5 mg Intravenous Q6H PRN Thurnell Lose, MD      . simethicone (MYLICON) chewable tablet 80 mg  80 mg Oral QID PRN Elwin Mocha, MD   80 mg at 10/25/2016 2141  . traZODone (DESYREL) tablet 25 mg  25 mg Oral QHS PRN Elwin Mocha, MD        Allergies as of 11/16/2016 - Review Complete 11/17/2016  Allergen Reaction Noted  . Aspirin Swelling 01/21/2011     Review of Systems:    Constitutional: No fever or chills Skin: No rash  Cardiovascular: No chest pain Respiratory: No SOB Gastrointestinal: See HPI and otherwise negative Genitourinary: No dysuria Neurological: No headache, dizziness or syncope Musculoskeletal: No new muscle or joint pain Hematologic: No bruising Psychiatric: No history of depression or anxiety   Physical Exam:  Vital signs in last 24 hours: Temp:  [97.7 F (36.5  C)-98.2 F (36.8 C)] 97.7 F (36.5 C) (10/27 2052) Pulse Rate:  [85-87] 87 (10/27 2052) Resp:  [18-20] 20 (10/27 2052) BP: (123-138)/(64-66) 138/64 (10/27 2052) SpO2:  [97 %-99 %] 99 % (10/27 2052) Last BM Date: 05-Dec-2016 General:   Pleasant Caucasian male appears to be in NAD, Well developed, Well nourished, alert and cooperative Head:  Normocephalic and  atraumatic. Eyes:   PEERL, EOMI. No icterus. Conjunctiva pink. Ears:  Normal auditory acuity. Neck:  Supple Throat: Oral cavity and pharynx without inflammation, swelling or lesion. Lungs: Respirations even and unlabored. Lungs clear to auscultation left and decreased BS right lower.   No wheezes, crackles, or rhonchi.  Heart: Normal S1, S2. No MRG. Regular rate and rhythm. No peripheral edema, cyanosis or pallor.  Abdomen:  Soft, nondistended, moderately tender b/l upper abdomen, No rebound or guarding. Normal bowel sounds. No appreciable masses or hepatomegaly.(patient clutching abdomen throughout exam and wincing as pain comes on and off) Rectal:  Today by Hospitalist-100cc dark blood after removal of finger Msk:  Symmetrical without gross deformities. Peripheral pulses intact.  Extremities:  Without edema, no deformity or joint abnormality.  Neurologic:  Alert and  oriented x4;  grossly normal neurologically.  Skin:   Dry and intact without significant lesions or rashes. Psychiatric: Demonstrates good judgement and reason without abnormal affect or behaviors.   LAB RESULTS:  Recent Labs  11/23/2016 0544 11/20/16 0402 12/05/16 0407  WBC 6.2 1.4* 4.0  HGB 15.5 12.8* 13.9  HCT 42.2 36.4* 39.4  PLT 154 92* 108*   BMET  Recent Labs  10/29/2016 0544 11/20/16 0402 12/05/2016 0407  NA 134* 141 143  K 3.4* 3.8 3.5  CL 96* 111 115*  CO2 20* 21* 20*  GLUCOSE 165* 144* 176*  BUN 63* 54* 61*  CREATININE 1.58* 1.30* 1.37*  CALCIUM 9.6 8.2* 8.0*   LFT  Recent Labs  11/20/2016 0544  PROT 6.6  ALBUMIN 3.4*  AST 37  ALT 31  ALKPHOS 105  BILITOT 2.0*   STUDIES: Dg Abd Portable 1v  Result Date: 11/20/2016 CLINICAL DATA:  Nausea and vomiting. EXAM: PORTABLE ABDOMEN - 1 VIEW COMPARISON:  CT 10/25/2016 FINDINGS: Portable supine view of the abdomen demonstrates gaseous distension of the stomach. There is gas in the small and large bowel. Splenic artery is heavily calcified. Right  pleural fluid is partially visualized. IMPRESSION: Gaseous distension of the stomach. Right pleural effusion. Electronically Signed   By: Markus Daft M.D.   On: 11/20/2016 08:43   PREVIOUS ENDOSCOPIES:            See HPI   Impression / Plan:   Impression: 1. Rectal bleeding: known large adenomatous polyp via flex sig 09/2016, recent nausea and vomiting, on chemo and radiation for esophageal cancer, rectal exam by hospitalist this am with large amount of "dark blood", rpt hgb after was 14.5, increased from previous; consider bleeding from known large rectal polyp vs possibly upper GI bleeding with abdominal pain mostly above the umbilicus and nausea/vomiting 2. Esophageal Adenocarcinoma: on chemo/radiation currently 3. Cirrhosisn + ascites 4. Nausea and Vomiting: Better since in the hospital, unable to tolerate medicine since Thursday of last week, for 4 days 5. Abdominal pain: In a band across upper abdomen, coming in waves currently 8-9/10, some help from morphine in the ED, patient unable to take medicines at home since Thursday or eat; consider side effects from chemo and radiation versus PUD versus other  Plan: 1.  On exam I am not sure  where patient's abdominal pain is coming from, it seems to be mostly upper abdomen, continue Protonix 40 mg twice daily to cover for any possible upper GI source 2.  Patient to remain n.p.o. until seen by Dr. Carlean Purl later today 3.  Continue other supportive measures 4.  Will order stool studies for C. difficile 5.  Please await final recommendations from Dr. Carlean Purl later today  Thank you for your kind consultation, we will continue to follow.  Lavone Nian [facility]  22-Nov-2016, 9:39 AM Pager #: [phone]     [facility] Attending   I have taken an interval history, reviewed the chart and examined the patient. I agree with the Advanced Practitioner's note, impression and recommendations.   Confusing picture - mid and lower abdominal pain he tells  me - upper tells Anderson [place] He is suffering with pain - Morphine just adjuested ? C diff ? Ischemia ? Side effects chemo/XRT Do not think rectal polyp causing problems  I wonder about bowel ischemia given hx of diarrhea and pain and nnow bleeding  He is frail and terminal w/ sig problems - cancer on top of cirrhosis and 89 Brief discussion - he wants to stop the pain and suffering more than anything No testing now  Will see what f/u brings I am concerned he may not make it throught his - he is aware  Gatha Mayer, MD, [facility] [phone] (pager) 22-Nov-2016 2:42 PM

## 2016-11-25 DEATH — deceased

## 2016-11-26 ENCOUNTER — Ambulatory Visit: Payer: Medicare Other

## 2016-11-29 ENCOUNTER — Other Ambulatory Visit: Payer: Medicare Other

## 2016-11-29 ENCOUNTER — Ambulatory Visit: Payer: Medicare Other

## 2016-11-30 ENCOUNTER — Ambulatory Visit: Payer: Medicare Other

## 2016-12-01 ENCOUNTER — Ambulatory Visit: Payer: Medicare Other

## 2016-12-02 ENCOUNTER — Ambulatory Visit: Payer: Medicare Other

## 2016-12-03 ENCOUNTER — Ambulatory Visit: Payer: Medicare Other

## 2016-12-06 ENCOUNTER — Other Ambulatory Visit: Payer: Self-pay | Admitting: Family Medicine

## 2017-09-23 ENCOUNTER — Ambulatory Visit: Payer: Medicare Other | Admitting: *Deleted

## 2018-10-11 IMAGING — CT CT CHEST W/ CM
2 of 5 series · 14 of 46 positions shown, 16 images · IV contrast (ISOVUE 300)
Comparison: Chest CT on 06/25/2016 and AP CT on 10/17/2010

CLINICAL DATA: Newly diagnosed esophageal adenocarcinoma.  Staging.

EXAM:
CT CHEST, ABDOMEN, AND PELVIS WITH CONTRAST
TECHNIQUE: Multidetector CT imaging of the chest, abdomen and pelvis was
performed following the standard protocol during bolus
administration of intravenous contrast.
CONTRAST:  100 mL [id number]

[Series 2: cap with · axial · 0.82mm/px · z∈[+891,+1421]mm · 11 of 126 slices shown, 13 images]
[im 10/126  soft-tissue]
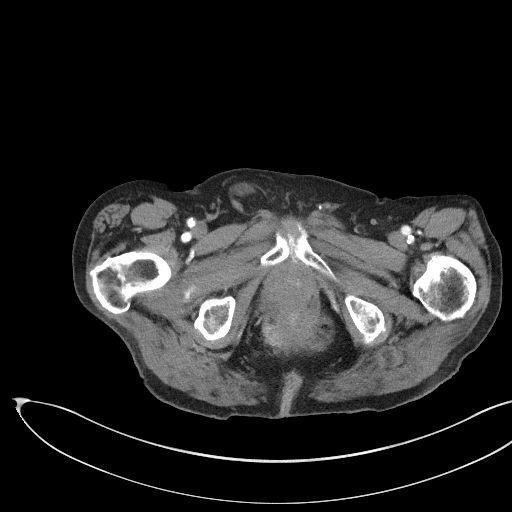
[im 10/126  bone]
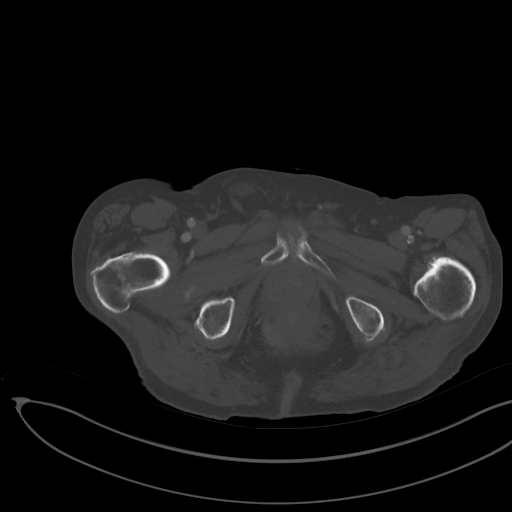
[im 20/126  soft-tissue]
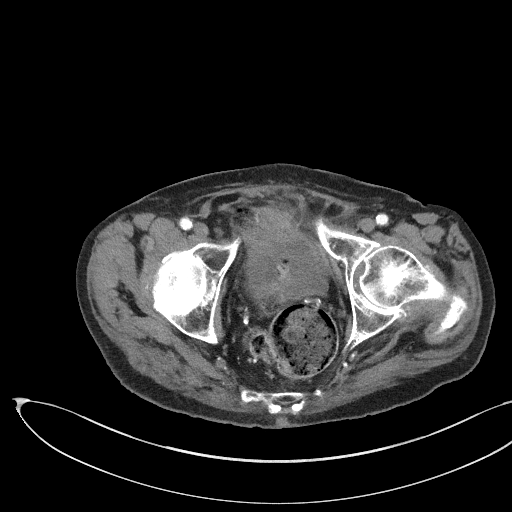
[im 29/126  soft-tissue]
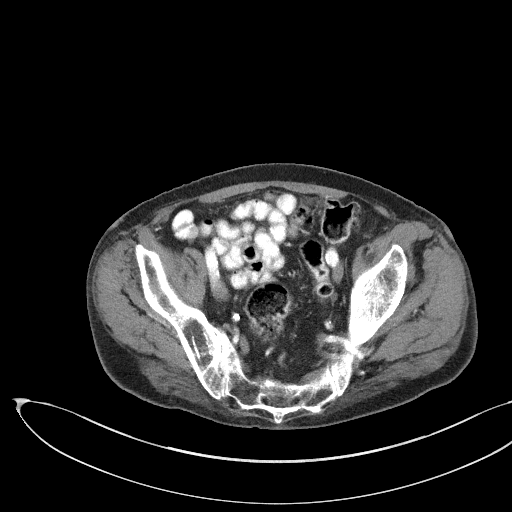
[im 39/126  soft-tissue]
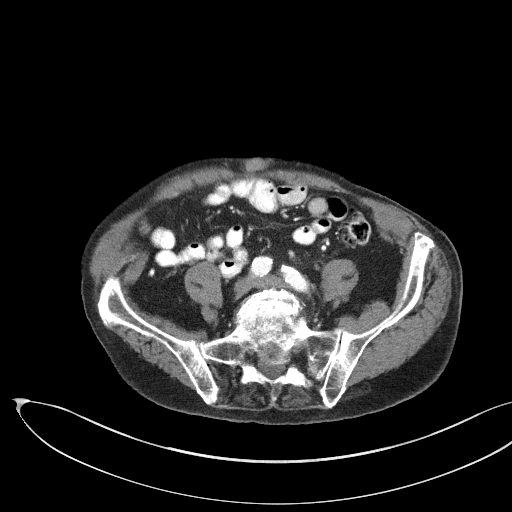
[im 49/126  soft-tissue]
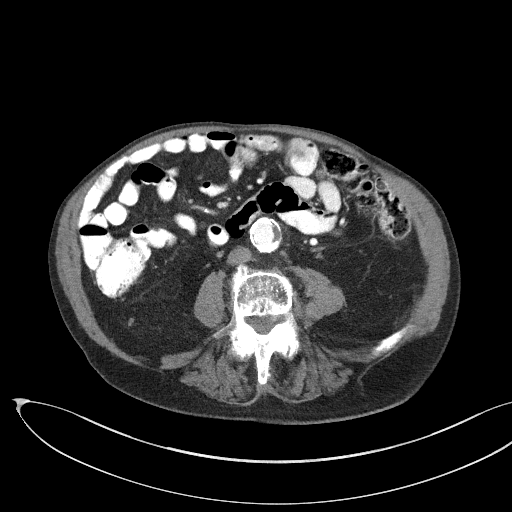
[im 68/126  soft-tissue]
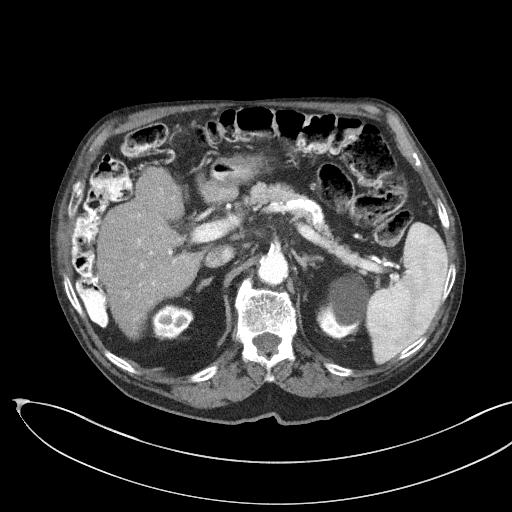
[im 77/126  soft-tissue]
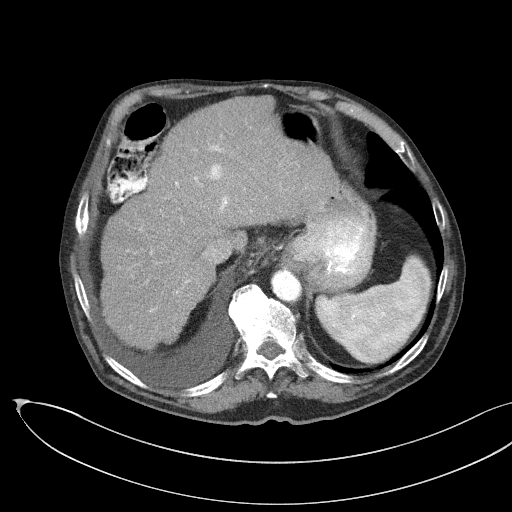
[im 87/126  soft-tissue]
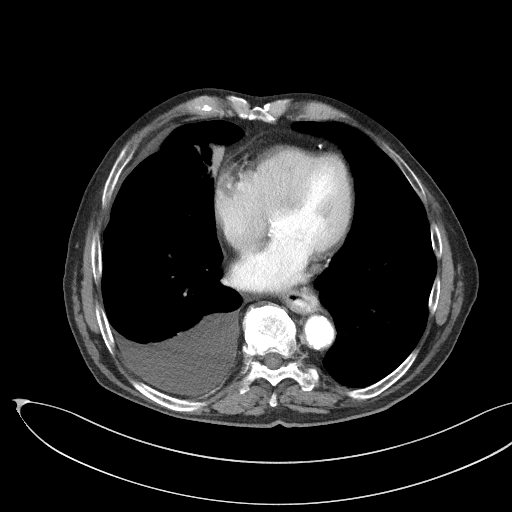
[im 97/126  soft-tissue]
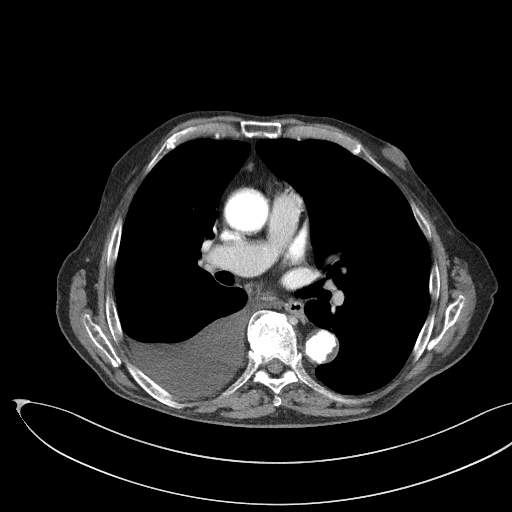
[im 97/126  bone]
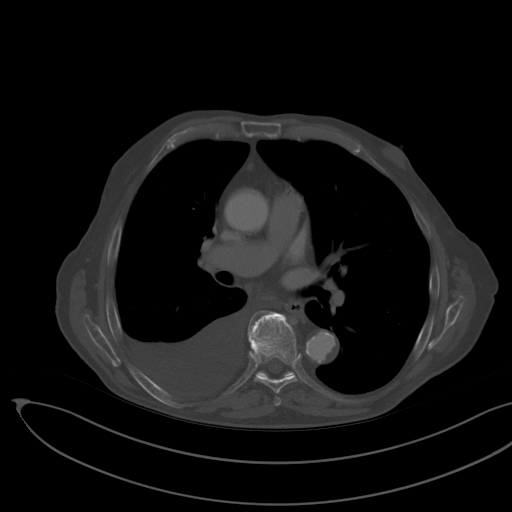
[im 106/126  soft-tissue]
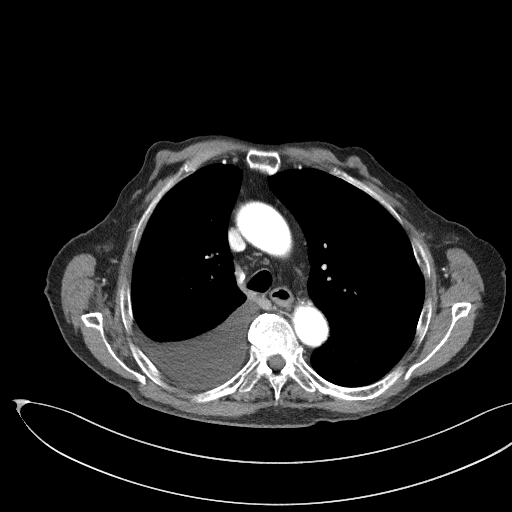
[im 116/126  soft-tissue]
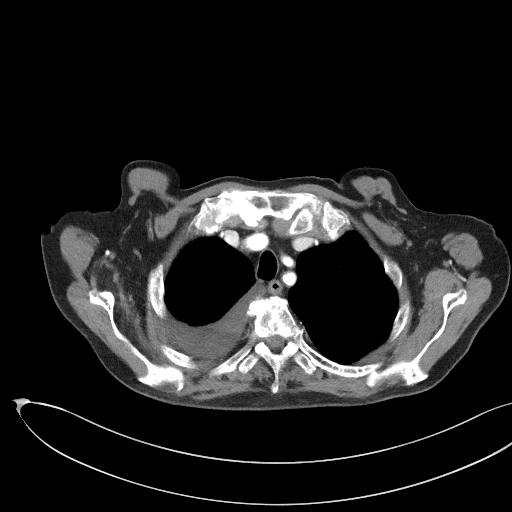

[Series 5: coronal · coronal · 0.85mm/px · 3 of 143 slices shown]
[im 48/143  soft-tissue]
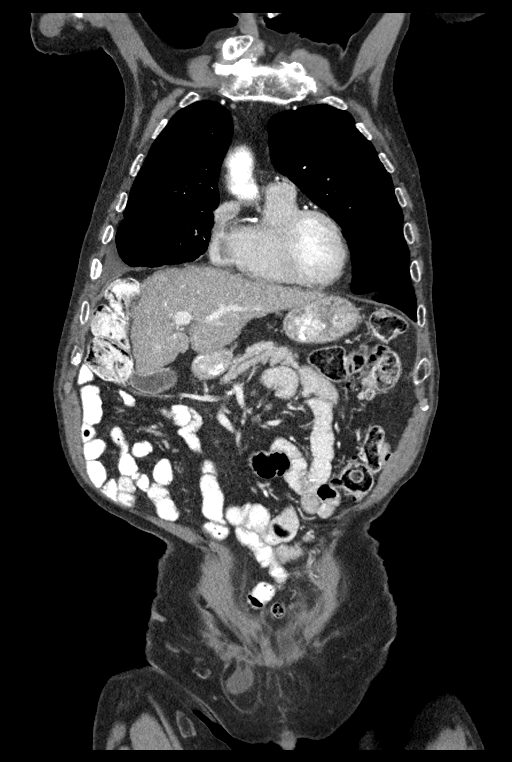
[im 64/143  soft-tissue]
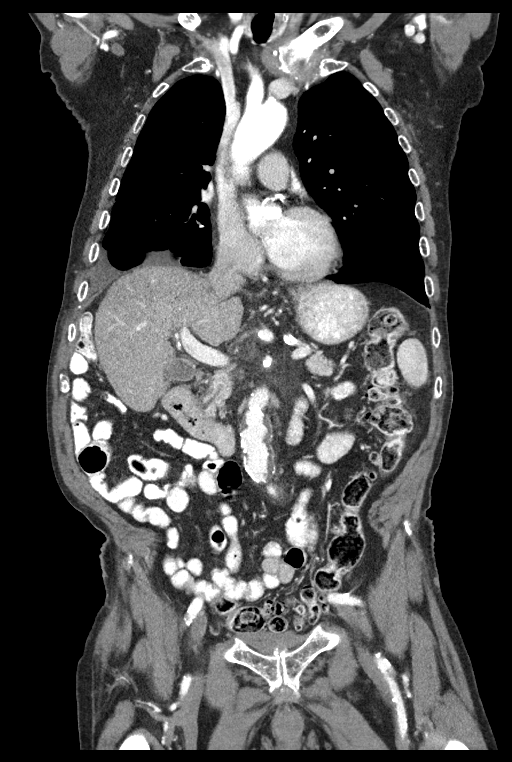
[im 79/143  soft-tissue]
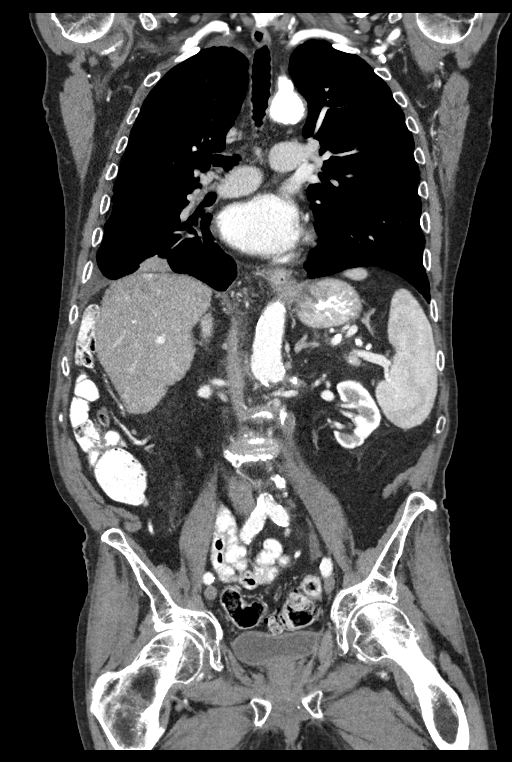

[14 of 46 positions shown; findings below may reference images not displayed]

FINDINGS: CT CHEST FINDINGS

Cardiovascular: No acute findings. Aortic and coronary artery
atherosclerosis.

Mediastinum/Lymph Nodes: Focal area of concentric wall thickening
and enhancement is seen involving the distal thoracic esophagus,
consistent with known esophageal carcinoma. No pathologically
enlarged lymph nodes identified.

Lungs/Pleura: Moderate right pleural effusion is mildly decreased in
size since previous study. No evidence of pulmonary infiltrate or
mass.

Musculoskeletal:  No suspicious bone lesions identified.

CT ABDOMEN AND PELVIS FINDINGS

Hepatobiliary: Hepatic cirrhosis again demonstrated. A 1.2 cm
hypervascular mass is seen in segment 2 of the left lobe which
measures 1.2 cm on image 50/2. This is slightly increased in size
compared to 10 mm on previous study. A second 10 mm hypervascular
mass is seen in the dome of the right hepatic lobe on image 46/2
which was not seen on previous study. These lesions are suspicious
for hepatocellular carcinoma in the setting of hepatic cirrhosis.
Gallbladder is unremarkable.

Pancreas:  No mass or inflammatory changes.

Spleen:  Mild splenomegaly, consistent portal venous hypertension.

Adrenals/Urinary tract: Several renal cysts again seen bilaterally.
No masses or hydronephrosis.

Stomach/Bowel: Enhancing polypoid soft tissue density seen along the
right lateral rectal wall measuring 2.5 x 3.1 cm, and highly
suspicious for rectal carcinoma. No evidence of bowel obstruction.
Diverticulosis seen mainly involving the descending sigmoid colon,
however there is no evidence of diverticulitis. Normal appendix
visualized.

Vascular/Lymphatic: No pathologically enlarged lymph nodes
identified. No abdominal aortic aneurysm. Aortic atherosclerosis.

Reproductive:  No mass or other significant abnormality identified.

Other:  Stable small fat-containing right inguinal hernia.

Musculoskeletal:  No suspicious bone lesions identified.
IMPRESSION: Focal concentric wall thickening and enhancement of distal thoracic
esophagus, consistent known primary esophageal carcinoma. No
definite evidence of metastatic disease within the chest, abdomen,
or pelvis.

Moderate right pleural effusion, mildly decreased in size since
prior study.

3 cm enhancing polypoid soft tissue density along the right lateral
rectal wall, highly suspicious for primary rectal carcinoma.
Recommend correlation with rectal exam and colonoscopy.

Hepatic cirrhosis and findings of portal venous hypertension. Two
small hypervascular masses in the right and left lobes, mildly
increased and suspicious for multifocal hepatocellular carcinoma.

Other incidental findings described above.
